# Patient Record
Sex: Male | Born: 1960 | Race: White | Hispanic: No | Marital: Single | Smoking: Never smoker
Health system: Southern US, Community
[De-identification: ages and names within clinical notes are randomized; demographics above are authoritative.]

## PROBLEM LIST (undated history)

## (undated) DIAGNOSIS — I10 Essential (primary) hypertension: Secondary | ICD-10-CM

## (undated) DIAGNOSIS — G4730 Sleep apnea, unspecified: Secondary | ICD-10-CM

## (undated) DIAGNOSIS — Z8614 Personal history of Methicillin resistant Staphylococcus aureus infection: Secondary | ICD-10-CM

## (undated) DIAGNOSIS — F329 Major depressive disorder, single episode, unspecified: Secondary | ICD-10-CM

## (undated) DIAGNOSIS — K219 Gastro-esophageal reflux disease without esophagitis: Secondary | ICD-10-CM

## (undated) DIAGNOSIS — F32A Depression, unspecified: Secondary | ICD-10-CM

## (undated) DIAGNOSIS — M1990 Unspecified osteoarthritis, unspecified site: Secondary | ICD-10-CM

## (undated) DIAGNOSIS — E785 Hyperlipidemia, unspecified: Secondary | ICD-10-CM

## (undated) DIAGNOSIS — F419 Anxiety disorder, unspecified: Secondary | ICD-10-CM

## (undated) DIAGNOSIS — R011 Cardiac murmur, unspecified: Secondary | ICD-10-CM

## (undated) DIAGNOSIS — C801 Malignant (primary) neoplasm, unspecified: Secondary | ICD-10-CM

## (undated) DIAGNOSIS — R519 Headache, unspecified: Secondary | ICD-10-CM

## (undated) HISTORY — DX: Anxiety disorder, unspecified: F41.9

## (undated) HISTORY — DX: Cardiac murmur, unspecified: R01.1

## (undated) HISTORY — DX: Major depressive disorder, single episode, unspecified: F32.9

## (undated) HISTORY — DX: Hyperlipidemia, unspecified: E78.5

## (undated) HISTORY — DX: Unspecified osteoarthritis, unspecified site: M19.90

## (undated) HISTORY — PX: WISDOM TOOTH EXTRACTION: SHX21

## (undated) HISTORY — DX: Sleep apnea, unspecified: G47.30

## (undated) HISTORY — DX: Gastro-esophageal reflux disease without esophagitis: K21.9

## (undated) HISTORY — PX: EYE SURGERY: SHX253

## (undated) HISTORY — DX: Depression, unspecified: F32.A

## (undated) HISTORY — DX: Essential (primary) hypertension: I10

---
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
## 1998-10-05 ENCOUNTER — Encounter: Admission: RE | Admit: 1998-10-05 | Discharge: 1998-10-05 | Payer: Self-pay | Admitting: Family Medicine

## 1998-11-09 ENCOUNTER — Encounter: Admission: RE | Admit: 1998-11-09 | Discharge: 1998-11-09 | Payer: Self-pay | Admitting: Family Medicine

## 1999-12-07 ENCOUNTER — Encounter: Admission: RE | Admit: 1999-12-07 | Discharge: 1999-12-07 | Payer: Self-pay | Admitting: Family Medicine

## 1999-12-28 ENCOUNTER — Encounter: Admission: RE | Admit: 1999-12-28 | Discharge: 1999-12-28 | Payer: Self-pay | Admitting: Family Medicine

## 2001-02-11 ENCOUNTER — Ambulatory Visit (HOSPITAL_COMMUNITY): Admission: RE | Admit: 2001-02-11 | Discharge: 2001-02-11 | Payer: Self-pay | Admitting: Orthopedic Surgery

## 2001-02-11 ENCOUNTER — Encounter: Payer: Self-pay | Admitting: Orthopedic Surgery

## 2001-11-05 ENCOUNTER — Encounter: Admission: RE | Admit: 2001-11-05 | Discharge: 2001-11-05 | Payer: Self-pay | Admitting: Family Medicine

## 2002-04-08 ENCOUNTER — Encounter: Admission: RE | Admit: 2002-04-08 | Discharge: 2002-04-08 | Payer: Self-pay | Admitting: Family Medicine

## 2002-12-15 ENCOUNTER — Encounter: Admission: RE | Admit: 2002-12-15 | Discharge: 2002-12-15 | Payer: Self-pay | Admitting: Family Medicine

## 2003-01-26 ENCOUNTER — Encounter: Admission: RE | Admit: 2003-01-26 | Discharge: 2003-01-26 | Payer: Self-pay | Admitting: Sports Medicine

## 2003-06-29 ENCOUNTER — Encounter: Admission: RE | Admit: 2003-06-29 | Discharge: 2003-06-29 | Payer: Self-pay | Admitting: Sports Medicine

## 2003-07-01 ENCOUNTER — Encounter: Admission: RE | Admit: 2003-07-01 | Discharge: 2003-07-01 | Payer: Self-pay | Admitting: Family Medicine

## 2003-09-14 ENCOUNTER — Encounter: Admission: RE | Admit: 2003-09-14 | Discharge: 2003-09-14 | Payer: Self-pay | Admitting: Family Medicine

## 2003-09-22 ENCOUNTER — Encounter: Admission: RE | Admit: 2003-09-22 | Discharge: 2003-09-22 | Payer: Self-pay | Admitting: Sports Medicine

## 2003-09-22 ENCOUNTER — Encounter: Admission: RE | Admit: 2003-09-22 | Discharge: 2003-09-22 | Payer: Self-pay | Admitting: Family Medicine

## 2005-01-25 ENCOUNTER — Ambulatory Visit: Payer: Self-pay | Admitting: Family Medicine

## 2006-08-17 ENCOUNTER — Ambulatory Visit: Payer: Self-pay | Admitting: Family Medicine

## 2006-11-12 ENCOUNTER — Ambulatory Visit: Payer: Self-pay | Admitting: Sports Medicine

## 2006-11-28 ENCOUNTER — Encounter: Admission: RE | Admit: 2006-11-28 | Discharge: 2006-11-28 | Payer: Self-pay | Admitting: General Surgery

## 2007-01-24 DIAGNOSIS — F528 Other sexual dysfunction not due to a substance or known physiological condition: Secondary | ICD-10-CM

## 2007-01-24 DIAGNOSIS — G4700 Insomnia, unspecified: Secondary | ICD-10-CM

## 2007-03-19 ENCOUNTER — Ambulatory Visit (HOSPITAL_BASED_OUTPATIENT_CLINIC_OR_DEPARTMENT_OTHER): Admission: RE | Admit: 2007-03-19 | Discharge: 2007-03-19 | Payer: Self-pay | Admitting: General Surgery

## 2007-03-19 ENCOUNTER — Encounter (INDEPENDENT_AMBULATORY_CARE_PROVIDER_SITE_OTHER): Payer: Self-pay | Admitting: Specialist

## 2007-05-07 ENCOUNTER — Telehealth (INDEPENDENT_AMBULATORY_CARE_PROVIDER_SITE_OTHER): Payer: Self-pay | Admitting: Family Medicine

## 2007-05-09 ENCOUNTER — Telehealth (INDEPENDENT_AMBULATORY_CARE_PROVIDER_SITE_OTHER): Payer: Self-pay | Admitting: *Deleted

## 2007-06-04 ENCOUNTER — Telehealth (INDEPENDENT_AMBULATORY_CARE_PROVIDER_SITE_OTHER): Payer: Self-pay | Admitting: Family Medicine

## 2007-06-26 ENCOUNTER — Encounter (INDEPENDENT_AMBULATORY_CARE_PROVIDER_SITE_OTHER): Payer: Self-pay | Admitting: Family Medicine

## 2008-01-17 ENCOUNTER — Telehealth (INDEPENDENT_AMBULATORY_CARE_PROVIDER_SITE_OTHER): Payer: Self-pay | Admitting: *Deleted

## 2008-02-20 ENCOUNTER — Encounter: Payer: Self-pay | Admitting: *Deleted

## 2008-03-27 ENCOUNTER — Ambulatory Visit: Payer: Self-pay | Admitting: Family Medicine

## 2008-03-27 ENCOUNTER — Encounter (INDEPENDENT_AMBULATORY_CARE_PROVIDER_SITE_OTHER): Payer: Self-pay | Admitting: Family Medicine

## 2008-03-27 ENCOUNTER — Telehealth (INDEPENDENT_AMBULATORY_CARE_PROVIDER_SITE_OTHER): Payer: Self-pay | Admitting: *Deleted

## 2008-03-27 LAB — CONVERTED CEMR LAB
BUN: 17 mg/dL (ref 6–23)
CO2: 22 meq/L (ref 19–32)
Calcium: 9.2 mg/dL (ref 8.4–10.5)
Chloride: 108 meq/L (ref 96–112)
Cholesterol: 217 mg/dL — ABNORMAL HIGH (ref 0–200)
Creatinine, Ser: 0.98 mg/dL (ref 0.40–1.50)
Glucose, Bld: 97 mg/dL (ref 70–99)
HCT: 44.5 % (ref 39.0–52.0)
HDL: 44 mg/dL (ref >39)
Hemoglobin: 15.1 g/dL (ref 13.0–17.0)
LDL Cholesterol: 147 mg/dL — ABNORMAL HIGH (ref 0–99)
MCHC: 33.9 g/dL (ref 30.0–36.0)
MCV: 92.9 fL (ref 78.0–100.0)
PSA: 0.85 ng/mL (ref 0.10–4.00)
Platelets: 200 10*3/uL (ref 150–400)
Potassium: 4.6 meq/L (ref 3.5–5.3)
RBC: 4.79 M/uL (ref 4.22–5.81)
RDW: 13.4 % (ref 11.5–15.5)
Sodium: 140 meq/L (ref 135–145)
Total CHOL/HDL Ratio: 4.9
Triglycerides: 129 mg/dL (ref <150)
VLDL: 26 mg/dL (ref 0–40)
WBC: 4.6 10*3/uL (ref 4.0–10.5)

## 2008-03-28 ENCOUNTER — Encounter (INDEPENDENT_AMBULATORY_CARE_PROVIDER_SITE_OTHER): Payer: Self-pay | Admitting: Family Medicine

## 2008-07-22 ENCOUNTER — Ambulatory Visit: Payer: Self-pay | Admitting: Family Medicine

## 2008-07-22 ENCOUNTER — Telehealth: Payer: Self-pay | Admitting: *Deleted

## 2008-07-27 ENCOUNTER — Telehealth: Payer: Self-pay | Admitting: *Deleted

## 2008-07-29 ENCOUNTER — Encounter: Admission: RE | Admit: 2008-07-29 | Discharge: 2008-07-29 | Payer: Self-pay | Admitting: Family Medicine

## 2008-07-29 ENCOUNTER — Telehealth: Payer: Self-pay | Admitting: Psychology

## 2008-07-29 ENCOUNTER — Ambulatory Visit: Payer: Self-pay | Admitting: Family Medicine

## 2008-07-29 DIAGNOSIS — F911 Conduct disorder, childhood-onset type: Secondary | ICD-10-CM | POA: Insufficient documentation

## 2008-12-10 ENCOUNTER — Telehealth: Payer: Self-pay | Admitting: *Deleted

## 2009-03-09 ENCOUNTER — Telehealth (INDEPENDENT_AMBULATORY_CARE_PROVIDER_SITE_OTHER): Payer: Self-pay | Admitting: *Deleted

## 2009-06-17 ENCOUNTER — Telehealth: Payer: Self-pay | Admitting: Family Medicine

## 2009-08-16 ENCOUNTER — Encounter: Payer: Self-pay | Admitting: Family Medicine

## 2009-08-16 ENCOUNTER — Ambulatory Visit: Payer: Self-pay | Admitting: Family Medicine

## 2009-08-18 LAB — CONVERTED CEMR LAB
ALT: 23 units/L (ref 0–53)
AST: 16 units/L (ref 0–37)
Albumin: 4.5 g/dL (ref 3.5–5.2)
Alkaline Phosphatase: 49 units/L (ref 39–117)
BUN: 19 mg/dL (ref 6–23)
CO2: 24 meq/L (ref 19–32)
Calcium: 9 mg/dL (ref 8.4–10.5)
Chloride: 106 meq/L (ref 96–112)
Cholesterol: 198 mg/dL (ref 0–200)
Creatinine, Ser: 1.14 mg/dL (ref 0.40–1.50)
Glucose, Bld: 89 mg/dL (ref 70–99)
HCT: 45 % (ref 39.0–52.0)
HDL: 47 mg/dL (ref >39)
Hemoglobin: 15.1 g/dL (ref 13.0–17.0)
Hep B S AB Quant (Post): 9.9 milliintl units/mL
LDL Cholesterol: 122 mg/dL — ABNORMAL HIGH (ref 0–99)
MCHC: 33.6 g/dL (ref 30.0–36.0)
MCV: 92 fL (ref 78.0–100.0)
Platelets: 217 10*3/uL (ref 150–400)
Potassium: 4.5 meq/L (ref 3.5–5.3)
RBC: 4.89 M/uL (ref 4.22–5.81)
RDW: 13 % (ref 11.5–15.5)
Sodium: 140 meq/L (ref 135–145)
Total Bilirubin: 0.6 mg/dL (ref 0.3–1.2)
Total CHOL/HDL Ratio: 4.2
Total Protein: 7.1 g/dL (ref 6.0–8.3)
Triglycerides: 144 mg/dL (ref <150)
VLDL: 29 mg/dL (ref 0–40)
WBC: 5 10*3/uL (ref 4.0–10.5)

## 2009-08-30 ENCOUNTER — Telehealth: Payer: Self-pay | Admitting: Family Medicine

## 2009-08-31 ENCOUNTER — Encounter: Payer: Self-pay | Admitting: Family Medicine

## 2009-10-04 ENCOUNTER — Telehealth: Payer: Self-pay | Admitting: Family Medicine

## 2009-11-02 ENCOUNTER — Telehealth: Payer: Self-pay | Admitting: Family Medicine

## 2009-11-27 HISTORY — PX: OTHER SURGICAL HISTORY: SHX169

## 2009-12-29 ENCOUNTER — Telehealth: Payer: Self-pay | Admitting: Psychology

## 2010-01-06 ENCOUNTER — Ambulatory Visit: Payer: Self-pay | Admitting: Psychology

## 2010-01-06 DIAGNOSIS — F39 Unspecified mood [affective] disorder: Secondary | ICD-10-CM | POA: Insufficient documentation

## 2010-01-11 ENCOUNTER — Telehealth: Payer: Self-pay | Admitting: Psychology

## 2010-01-28 ENCOUNTER — Telehealth: Payer: Self-pay | Admitting: Family Medicine

## 2010-01-28 ENCOUNTER — Ambulatory Visit: Payer: Self-pay | Admitting: Family Medicine

## 2010-01-31 ENCOUNTER — Telehealth: Payer: Self-pay | Admitting: Psychology

## 2010-01-31 ENCOUNTER — Telehealth: Payer: Self-pay | Admitting: *Deleted

## 2010-02-07 ENCOUNTER — Telehealth (INDEPENDENT_AMBULATORY_CARE_PROVIDER_SITE_OTHER): Payer: Self-pay | Admitting: Family Medicine

## 2010-02-08 ENCOUNTER — Telehealth: Payer: Self-pay | Admitting: Family Medicine

## 2010-02-15 ENCOUNTER — Telehealth: Payer: Self-pay | Admitting: Psychology

## 2010-03-10 ENCOUNTER — Telehealth: Payer: Self-pay | Admitting: Family Medicine

## 2010-03-10 ENCOUNTER — Telehealth: Payer: Self-pay | Admitting: Psychology

## 2010-03-10 ENCOUNTER — Ambulatory Visit: Payer: Self-pay | Admitting: Family Medicine

## 2010-03-10 DIAGNOSIS — J069 Acute upper respiratory infection, unspecified: Secondary | ICD-10-CM | POA: Insufficient documentation

## 2010-03-10 DIAGNOSIS — M25579 Pain in unspecified ankle and joints of unspecified foot: Secondary | ICD-10-CM | POA: Insufficient documentation

## 2010-04-27 ENCOUNTER — Ambulatory Visit: Payer: Self-pay | Admitting: Psychology

## 2010-05-24 ENCOUNTER — Telehealth: Payer: Self-pay | Admitting: Psychology

## 2010-06-06 ENCOUNTER — Telehealth: Payer: Self-pay | Admitting: Family Medicine

## 2010-06-24 ENCOUNTER — Encounter: Payer: Self-pay | Admitting: Family Medicine

## 2010-07-06 ENCOUNTER — Telehealth: Payer: Self-pay | Admitting: Psychology

## 2010-07-07 ENCOUNTER — Telehealth: Payer: Self-pay | Admitting: Family Medicine

## 2010-08-02 ENCOUNTER — Telehealth: Payer: Self-pay | Admitting: Family Medicine

## 2010-08-08 ENCOUNTER — Ambulatory Visit: Payer: Self-pay | Admitting: Family Medicine

## 2010-08-12 ENCOUNTER — Telehealth: Payer: Self-pay | Admitting: Family Medicine

## 2010-08-15 ENCOUNTER — Ambulatory Visit: Payer: Self-pay | Admitting: Family Medicine

## 2010-08-15 ENCOUNTER — Encounter: Admission: RE | Admit: 2010-08-15 | Discharge: 2010-08-15 | Payer: Self-pay | Admitting: Family Medicine

## 2010-08-15 DIAGNOSIS — M722 Plantar fascial fibromatosis: Secondary | ICD-10-CM

## 2010-08-16 ENCOUNTER — Telehealth (INDEPENDENT_AMBULATORY_CARE_PROVIDER_SITE_OTHER): Payer: Self-pay | Admitting: *Deleted

## 2010-08-16 ENCOUNTER — Encounter: Payer: Self-pay | Admitting: Family Medicine

## 2010-08-19 ENCOUNTER — Encounter: Admission: RE | Admit: 2010-08-19 | Discharge: 2010-08-19 | Payer: Self-pay | Admitting: Family Medicine

## 2010-08-22 ENCOUNTER — Encounter (INDEPENDENT_AMBULATORY_CARE_PROVIDER_SITE_OTHER): Payer: Self-pay | Admitting: *Deleted

## 2010-08-22 ENCOUNTER — Encounter: Payer: Self-pay | Admitting: Family Medicine

## 2010-09-01 ENCOUNTER — Encounter: Payer: Self-pay | Admitting: Family Medicine

## 2010-11-01 ENCOUNTER — Ambulatory Visit: Payer: Self-pay | Admitting: Family Medicine

## 2010-11-01 ENCOUNTER — Encounter: Payer: Self-pay | Admitting: Family Medicine

## 2010-11-01 DIAGNOSIS — F411 Generalized anxiety disorder: Secondary | ICD-10-CM

## 2010-11-01 DIAGNOSIS — E785 Hyperlipidemia, unspecified: Secondary | ICD-10-CM

## 2010-11-01 LAB — CONVERTED CEMR LAB
ALT: 31 units/L (ref 0–53)
AST: 20 units/L (ref 0–37)
Albumin: 4.6 g/dL (ref 3.5–5.2)
Alkaline Phosphatase: 53 units/L (ref 39–117)
BUN: 14 mg/dL (ref 6–23)
CO2: 26 meq/L (ref 19–32)
Calcium: 9.8 mg/dL (ref 8.4–10.5)
Chloride: 105 meq/L (ref 96–112)
Cholesterol, target level: 200 mg/dL
Cholesterol: 245 mg/dL — ABNORMAL HIGH (ref 0–200)
Creatinine, Ser: 1.06 mg/dL (ref 0.40–1.50)
Glucose, Bld: 98 mg/dL (ref 70–99)
HDL goal, serum: 40 mg/dL
HDL: 46 mg/dL (ref >39)
LDL Cholesterol: 153 mg/dL — ABNORMAL HIGH (ref 0–99)
LDL Goal: 160 mg/dL
Potassium: 4.2 meq/L (ref 3.5–5.3)
Sodium: 139 meq/L (ref 135–145)
Total Bilirubin: 0.6 mg/dL (ref 0.3–1.2)
Total CHOL/HDL Ratio: 5.3
Total Protein: 7.3 g/dL (ref 6.0–8.3)
Triglycerides: 228 mg/dL — ABNORMAL HIGH (ref <150)
VLDL: 46 mg/dL — ABNORMAL HIGH (ref 0–40)

## 2010-11-04 ENCOUNTER — Telehealth: Payer: Self-pay | Admitting: Family Medicine

## 2010-11-07 ENCOUNTER — Telehealth: Payer: Self-pay | Admitting: Family Medicine

## 2010-11-10 ENCOUNTER — Encounter: Payer: Self-pay | Admitting: Family Medicine

## 2010-11-10 ENCOUNTER — Ambulatory Visit: Payer: Self-pay

## 2010-11-10 ENCOUNTER — Encounter: Payer: Self-pay | Admitting: Sports Medicine

## 2010-12-02 ENCOUNTER — Ambulatory Visit
Admission: RE | Admit: 2010-12-02 | Discharge: 2010-12-02 | Payer: Self-pay | Source: Home / Self Care | Attending: Family Medicine | Admitting: Family Medicine

## 2010-12-05 ENCOUNTER — Telehealth: Payer: Self-pay | Admitting: Family Medicine

## 2010-12-19 ENCOUNTER — Telehealth: Payer: Self-pay | Admitting: *Deleted

## 2010-12-27 NOTE — Progress Notes (Signed)
Summary: RX   Phone Note Call from Patient Call back at [phone]   Reason for Call: Talk to Doctor Summary of Call: pt was checking status of RX for ambien, says it was suppose to be up front & ready for pick up Initial call taken by: Knox Royalty,  February 08, 2010 9:32 AM  Follow-up for Phone Call        to PCP, Rx not in box, not entered in log for pt to sign out when picked up, do not know what happen to Rx after you put it in box.  Pt has apt with you on Mar 21 Follow-up by: Gladstone Pih,  February 08, 2010 11:52 AM  Additional Follow-up for Phone Call Additional follow up Details #1::        Will re-write the prescription and place it in the front office to be picked up. Additional Follow-up by: Marisue Ivan  MD,  February 08, 2010 1:13 PM     Appended Document: RX LVM that rx was up front ready to be picked up.

## 2010-12-27 NOTE — Letter (Signed)
Summary: AIM  AIM   Imported By: Marily Memos 08/16/2010 09:10:29  _____________________________________________________________________  External Attachment:    Type:   Image     Comment:   External Document

## 2010-12-27 NOTE — Assessment & Plan Note (Signed)
Summary: [facility] follow-up   Primary Care Provider:  Marisue Ivan  MD   History of Present Illness: Patient here to explore mood issues and is interested in medication options to help with his most bothersome symptoms of insomnia, anxiety and irritability.  First noticed problems with anxiety 20 years ago.  He reported being very self-confident and driven in a high demand occupation.  He reports he was never comfortable with the interpersonal side of things but would push through it.  This became increasingly difficult to manage and he lost his cheerful, gregarious side.  He reports an underlying sadness that would surface at times that he would suppress.    Saw a psychiatrist in 1996.  Offered "nothing of value."  No medication offered.  Gathered family history again.  See initial behavioral medicine note dated 01/06/10 for details.  Significant family history of mental illness (possible schizophrenia) in father.  Mother and two sisters have significant issues with anxiety and possibly depression.  Mother recently died (since the 2023-01-17 visit).  Jason Williams says he was very close to his mother.    Substance use:  Drinks red and white wine - about three bottles in a week.  Gets "buzzed" about 5 times a month.  Does not drink daily.  No marijuana use.  He reports having used Valium about 15 years ago and it helped with his anxiety.    Last two weeks - sadness everyday but not all day.  Won't allow himself to feel hopeless.  He reports periods of time, lasting several days where he has increased energy.  Volunteers that he does not have Bipolar Disorder in response to this line of questioning.  He does report a day or so of feeling like his old self about once or twice a month.  Reports increased irritability with an over-active mind during these times.    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  Affect is sad and irritable.  Thoughts are clear and goal directed.  Remains defensive but softens over the course of the visit.  Voices how difficult it is to come and ask for help.  Denied suicidal / homicidal ideatoin.  Symptoms do not meet strict criteria for a bipolar spectrum disorder.  With family history, treatment failures on antidepressants, sleep issues and symptom pattern (depressed mood with brief periods of "normal mood" characterized by high energy, racing thoughts, irritability and some more goal directed activity) recommended two medication options.  Discussed pros and cons.  Elected to start Lamotrigine to address the depression and hopefully capture some of the anxiety as well.  Discussed AE including unexpected rash and headache.  Discussed use of beta-blocker in specific situations - [place].  Discussed impact on exercise tolerance and taking pulse.  Patient agreed with the treatment planning.  Questions and concerns were elicited and addressed.  Benzodiazepines and anti-depressants are not good choices for pharmacologic management.  [phone])  Complete Medication List: 1)  Viagra 100 Mg Tabs (Sildenafil citrate) .Marland Kitchen.. 1 tab by mouth 30 mins prior to intercourse.  max 1 tab daily 2)  Ambien 10 Mg Tabs (Zolpidem tartrate) .Marland Kitchen.. 1 tab by mouth at bedtime as needed insomnia 3)  Doxycycline Hyclate 100 Mg Tabs (Doxycycline hyclate) .Marland Kitchen.. 1 tab by mouth two times a day x 7 days 4)  Lamotrigine 25 Mg Tabs (Lamotrigine) .... One daily for two  weeks and then two daily.  per dr. Kathrynn Running in [facility] clinic. 5)  Nadolol 20 Mg Tabs (Nadolol) .... Take one daily.  per dr. Kathrynn Running in mood disorder clinic.  Patient Instructions: 1)  Please schedule a follow-up for:  June 29th at 9:00.  Call if you are unable to make this appointment. 2)  Dr. Kathrynn Running started you on a medication called Lamotrigine or Lamictal.  You can go to [email or web address] to  read more about it.  If you have an unexpected rash, please call [phone] or get seen at the [facility].   3)  We are looking to balance safety, tolerability and effectiveness.  Please call if you have questions about this. 4)  Dr. Kathrynn Running also prescribed a beta-blocker for social anxiety.  This is to shut off that feedback loop of sweating.  Take it two to three hours prior to a situation where social anxiety may get in the way of your functioning.  Take one to determine tolerability and efficacy.  If tolerable but not efficacious - take two. Prescriptions: NADOLOL 20 MG TABS (NADOLOL) Take one daily.  Per Dr. Kathrynn Running in [facility].  #60 x 0   Entered and Authorized by:   Spero Geralds PsyD   Signed by:   Spero Geralds PsyD on 04/27/2010   Method used:   Handwritten   RxID:   [id number] LAMOTRIGINE 25 MG TABS (LAMOTRIGINE) One daily for two weeks and then two daily.  Per Dr. Kathrynn Running in [facility].  #60 x 0   Entered and Authorized by:   Spero Geralds PsyD   Signed by:   Spero Geralds PsyD on 04/27/2010   Method used:   Handwritten   RxID:   [id number]

## 2010-12-27 NOTE — Assessment & Plan Note (Signed)
Summary: Anxiety, Insomnia, HLD, impotence   Vital Signs:  Patient profile:   50 year old male Height:      69.2 inches Weight:      199.1 pounds BMI:     29.34 Temp:     97.7 degrees F oral Pulse rate:   77 / minute BP sitting:   135 / 94  (left arm) Cuff size:   regular  Vitals Entered By: Garen Grams LPN (November 01, 2010 9:15 AM) CC: Anxiety, insomonia, HLD, Lipid Management Is Patient Diabetic? No Pain Assessment Patient in pain? no        Primary Care Provider:  Jamie Brookes MD  CC:  Anxiety, insomonia, HLD, and Lipid Management.  History of Present Illness: Health Maintenence exam: Pt was late for his appointment so we focused on his major concerns including his anxiety, insomnia and labs.   Anxiety: Pt has been having problems with anxiety for a while. He has anxiety attacks at meetings/functions sometimes and has tried some things for it in the past. He has tried Celexa, Seroquel and nadolol for the anxiety but it has not helped. He has seen Dr. Pascal Lux and Dr. Kathrynn Running in clinic and thought they were nice but didn't feel he got much help out of the meeting.   Insomnia: Pt continues to use Ambien for his insomnia. It almost always works, he has not needed increased doses, he does not have any side effects from it so he would like to continue this med.   HLD: Pt has HLD and has not been on a statin. His lipids have not been checked in almost a year. He is fasting today and wants to get his bloodwork done.   Impotence: Pt has not had to use the Viagra much but does need a refill as it has been > than  a year since it was refilled.   Spent > 25 min face to face time with this patient.   Lipid Management History:      Positive NCEP/ATP III risk factors include male age 72 years old or older.  Negative NCEP/ATP III risk factors include non-diabetic and non-tobacco-user status.     Habits & Providers  Alcohol-Tobacco-Diet     Tobacco Status: never  Current  Medications (verified): 1)  Viagra 100 Mg  Tabs (Sildenafil Citrate) .Marland Kitchen.. 1 Tab By Mouth 30 Mins Prior To Intercourse.  Max 1 Tab Daily 2)  Ambien 10 Mg Tabs (Zolpidem Tartrate) .... Take 1 Tablet Every Night For Sleep 3)  Sertraline Hcl 25 Mg Tabs (Sertraline Hcl) .... Take 1 Pill Daily (Morning or Evening) 4)  Lorazepam 0.5 Mg Tabs (Lorazepam) .... Take 1 Pill At Times of Great Anxiety  Allergies (verified): No Known Drug Allergies  Social History: Lives in [place] with significant other.  Has one child. 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  Occasional EtOH No drug use. No supplemental vitamins. Lifts weights for exercise  Review of Systems        vitals reviewed and pertinent negatives and positives seen in HPI   Physical Exam  General:  Well-developed,well-nourished,in no acute distress; alert,appropriate and cooperative throughout examination Head:  Normocephalic and atraumatic without obvious abnormalities. No apparent alopecia or balding. Ears:  no external deformities.   Nose:  no external deformity.   Mouth:  good dentition.   Lungs:  Normal respiratory effort, chest expands symmetrically. Lungs are clear to auscultation, no crackles or wheezes. Heart:  Normal rate and regular rhythm.  S1 and S2 normal without gallop, murmur, click, rub or other extra sounds. Skin:  an SK on his back and a red spot on his back (? AK).  Psych:  Cognition and judgment appear intact. Alert and cooperative with normal attention span and concentration. No apparent delusions, illusions, hallucinations   Problems:  Medical Problems Added: 1)  Dx of Hyperlipidemia, Mild  (ICD-272.4) 2)  Dx of Anxiety State, Unspecified  (ICD-300.00)  Impression & Recommendations:  Problem # 1:  ANXIETY STATE, UNSPECIFIED (ICD-300.00) Assessment New Pt has anxiety at certain events and has not been treated adequately in the past. Plan to start Zoloft on a low dose and go up if  needed, then I also gave the patient #30 Ativan to keep for those extremely tense events when he thinks he may have a panic attack. Explained that this was not a daily med. RTC in 4 weeks to evaluate how he is doing on these meds. May need to increase the dose of the Zoloft if he is not feeling any better and has no SEs.   His updated medication list for this problem includes:    Sertraline Hcl 25 Mg Tabs (Sertraline hcl) .Marland Kitchen... Take 1 pill daily (morning or evening)    Lorazepam 0.5 Mg Tabs (Lorazepam) .Marland Kitchen... Take 1 pill at times of great anxiety  Orders: [facility]- Est  Level 4 ([address]109)  Problem # 2:  INSOMNIA NOS (ICD-780.52) Assessment: Unchanged Pt is doing well on the Ambien. Has tried Trazodone, Lunesta and has had Seroquel in the past and non of them helped. Plan to continue him on Ambien for now.   His updated medication list for this problem includes:    Ambien 10 Mg Tabs (Zolpidem tartrate) .Marland Kitchen... Take 1 tablet every night for sleep  Orders: [facility]- Est  Level 4 (99214)  Problem # 3:  HYPERLIPIDEMIA, MILD (ICD-272.4) Assessment: Unchanged Pt has elevated LDL of 122 about a year ago. Plan to retest. Could consider a statin if his LDL is still elevated but don't really have to start it until his LDL is > 160.   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   His updated medication list for this problem includes:    Viagra 100 Mg Tabs (Sildenafil citrate) .Marland Kitchen... 1 tab by mouth 30 mins prior to intercourse.  max 1 tab daily  Orders: [facility]- Est  Level 4 (09811)  Complete Medication List: 1)  Viagra 100 Mg Tabs (Sildenafil citrate) .Marland Kitchen.. 1 tab by mouth 30 mins prior to intercourse.  max 1 tab daily 2)  Ambien 10 Mg Tabs (Zolpidem tartrate) .... Take 1 tablet every night for sleep 3)  Sertraline Hcl 25 Mg Tabs (Sertraline hcl) .... Take 1 pill daily (morning or  evening) 4)  Lorazepam 0.5 Mg Tabs (Lorazepam) .... Take 1 pill at times of great anxiety  Other Orders: Comp Met-[facility] [phone]) Lipid-[facility] (13086-57846)  Lipid Assessment/Plan:      Based on NCEP/ATP III, the patient's risk factor category is "0-1 risk factors".  The patient's lipid goals are as follows: Total cholesterol goal is 200; LDL cholesterol goal is 160; HDL cholesterol goal is 40; Triglyceride goal is 150.  His LDL cholesterol goal has been met.    Patient Instructions: 1)  Come back in 4 weeks to go over your anxiety meds and to take a look at your skin.  Prescriptions: LORAZEPAM 0.5 MG TABS (LORAZEPAM) take 1 pill at times of great anxiety  #30 x 0   Entered and Authorized by:   Jamie Brookes MD   Signed by:   Jamie Brookes MD on 11/01/2010   Method used:   Handwritten   RxID:   [id number] SERTRALINE HCL 25 MG TABS (SERTRALINE HCL) take 1 pill daily (morning or evening)  #31 x 2   Entered and Authorized by:   Jamie Brookes MD   Signed by:   Jamie Brookes MD on 11/01/2010   Method used:   Electronically to        [facility]  [address] [phone]* (retail)       [address]       [place], [place]  [address]       Ph: [phone] or [phone]       Fax: [phone]   RxID:   [id number] AMBIEN 10 MG TABS (ZOLPIDEM TARTRATE) take 1 tablet every night for sleep  #31 x 7   Entered and Authorized by:   Jamie Brookes MD   Signed by:   Jamie Brookes MD on 11/01/2010   Method used:   Printed then faxed to ...       [facility] (mail-order)             , [place]         Ph: [phone]       Fax: [phone]   RxID:   [id number] VIAGRA 100 MG  TABS (SILDENAFIL CITRATE) 1 tab by mouth 30 mins prior to intercourse.  Max 1 tab daily  #30 x 1   Entered and Authorized by:   Jamie Brookes MD   Signed by:   Jamie Brookes MD on 11/01/2010   Method used:   Faxed to ...       [facility] (mail-order)             , [place]         Ph: [phone]        Fax: [phone]   RxID:   [id number]    Orders Added: 1)  Comp Met-[facility] [[id number]] 2)  Lipid-[facility] [80061-22930] 3)  [facility]- Est  Level 4 [58099]

## 2010-12-27 NOTE — Progress Notes (Signed)
Summary: Reschedule appts   Phone Note Call from Patient   Caller: Patient Call For: Spero Geralds, Psy.D. Summary of Call: Patient called to cancel his beh-med appt for 01/20/10 secondary to being in training.  He said he is not in training on 3/2 which was my original understanding.  So [facility] and I have scheduled him there.  I have canceled the 3/4 appt with Dr. Burnadette Pop secondary to this change.  [facility] will initiate pharmacotherapy and follow him up 3/23 as initially scheduled.  Patient voiced an understanding of these changes.  He said he did not need to meet with Dr. Burnadette Pop for reasons other than starting a psychopharm agent so I thought it prudent to cancel that appt.   Initial call taken by: Spero Geralds PsyD,  January 11, 2010 10:58 AM

## 2010-12-27 NOTE — Progress Notes (Signed)
Summary: phn msg   Phone Note Call from Patient Call back at [facility] Phone [phone]   Caller: Patient Summary of Call: Pt requesting Dr. Burnadette Pop call him.  He has no recollection of his appointment today being cancelled. Initial call taken by: Clydell Hakim,  January 28, 2010 9:57 AM  Follow-up for Phone Call        Patient was transferred to me.  Not sure where the communication problems lie as this is the second time that we have had difficulty with his schedule.  He missed his [facility] appointment for March 2nd and does not remember the conversation we had about it (see phone note).  He is interested in starting a medication for sleep and mood prior to his March 23rd [facility] appt.  I said he could call Dr. Burnadette Pop to schedule an appt and hopefully he can get seen and then we can follow him up on the 23rd at 9:00.  He said he would write down his appointment for March 23rd at 9:00. Follow-up by: Spero Geralds PsyD,  January 28, 2010 10:03 AM  Additional Follow-up for Phone Call Additional follow up Details #1::        Pt in the office now. Additional Follow-up by: Marisue Ivan  MD,  January 28, 2010 11:13 AM

## 2010-12-27 NOTE — Progress Notes (Signed)
Summary: Discuss Meds   Phone Note Call from Patient Call back at [phone]   Reason for Call: Talk to Doctor Summary of Call: would like to discuss side effects of  the medication that was prescribed, pt cannot sleep even using ambien Initial call taken by: Knox Royalty,  February 07, 2010 10:34 AM  Follow-up for Phone Call        to pcp Follow-up by: Gladstone Pih,  February 07, 2010 11:51 AM  Additional Follow-up for Phone Call Additional follow up Details #1::        pt needs to set up an office visit to discuss treatment and possible side effects- better handled in the clinic and would be best treated by Dr. Pascal Lux and Dr. Kathrynn Running Additional Follow-up by: Marisue Ivan  MD,  February 07, 2010 12:30 PM    Additional Follow-up for Phone Call Additional follow up Details #2::    Notified pt, voiced understanding Follow-up by: Gladstone Pih,  February 07, 2010 1:31 PM

## 2010-12-27 NOTE — Assessment & Plan Note (Signed)
Summary: Started Seroquel for mood disorder NOS   Vital Signs:  Patient profile:   50 year old male Height:      69.2 inches Weight:      198.4 pounds BMI:     29.23 Temp:     97.7 degrees F oral Pulse rate:   78 / minute BP sitting:   140 / 86  (left arm) Cuff size:   large  Vitals Entered By: Gladstone Pih (January 28, 2010 11:06 AM) CC: f/u on mood instability Is Patient Diabetic? No Pain Assessment Patient in pain? no        Primary Care Provider:  Marisue Ivan  MD  CC:  f/u on mood instability.  History of Present Illness: 50yo M here to f/u on mood instability.  Mood instability: No absolute diagnosis at this time.  States that he continues to have mood swings but denies manic episodes.  He is open to the idea of pharmacological treatment.  He missed his appt with Behavioral Health.  No thoughts of hurting himself or others at this time.  Habits & Providers  Alcohol-Tobacco-Diet     Tobacco Status: never  Allergies: No Known Drug Allergies  Past History:  Past Medical History: Mood Disorder NOS Impotence Insomnia  Review of Systems       See HPI otherwise neg  Physical Exam  General:  VS Reviewed. Well appearing, NAD.  Psych:  No signs of mania. 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  Titrate up by 50mg  to a goal dose of 300mg  at bedtime. Will f/u in 2 weeks to reassess. Will f/u closely to assess for DM and HLD while on the medication.  Orders: [facility]- Est Level  3 (14782)  Complete Medication List: 1)  Viagra 100 Mg Tabs (Sildenafil citrate) .Marland Kitchen.. 1 tab by mouth 30 mins prior to intercourse.  max 1 tab daily 2)  Seroquel 50 Mg Tabs (Quetiapine fumarate) .Marland Kitchen.. 1 tab by mouth at bedtime  inc by 1 tablet every night until you get to 300mg  and  stay at that dose  Patient Instructions: 1)  Please schedule a follow-up appointment in 2 weeks.  2)  Follow the titration schedule of the seroquel.  Call me if you have any side effects.   Prescriptions: SEROQUEL 50 MG TABS (QUETIAPINE FUMARATE) 1 tab by mouth at bedtime  inc by 1 tablet every night until you get to 300mg  and stay at that dose  #80 x 0   Entered and Authorized by:   Marisue Ivan  MD   Signed by:   Marisue Ivan  MD on 01/28/2010   Method used:   Print then Give to Patient   RxID:   [id number]

## 2010-12-27 NOTE — Assessment & Plan Note (Signed)
Summary: F/U [facility]   Primary Care Kimorah Ridolfi:  Jason Ivan  MD   History of Present Illness: 1. Bilateral foot pain:  Seen in clinic last week and diagnosed with plantar fasciitis.  The pain is worse in the right foot then the left foot.  Was given some stretching exercises which he does everyday in the morning.   He was also place in a walking boot because of ankle pain.  The plantar fasciitis overall is not any better.  It doesn't bother him that much on the left but the right is still very painful. Not using the temporary custom orthotic with scaphoid pad as it seemed to make it hurt worse.  2. Right ankle pain:  Seen in clinic last week and diagnosed with chronic ATF injury.  Was place in a walking boot because he was not able to ambulate without pain.  He has been using this as directed.  The pain overall is not improved.  Still limits his ability to do certain activities.  He has also been doing the inversion / eversion exercises.  Very frustrated that he is not getting better. Continues to work full time--on his feet much of the day. Boot has heled a little with that pain (daily walking). Still not able to golf.  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 1 Tab By Mouth 30 Mins Prior To Intercourse.  Max 1 Tab Daily 2)  Doxycycline Hyclate 100 Mg Tabs (Doxycycline Hyclate) .Marland Kitchen.. 1 Tab By Mouth Two Times A Day X 7 Days 3)  Lamotrigine 25 Mg Tabs (Lamotrigine) .... One Daily For Two Weeks and Then Two Daily.  Per Dr. Kathrynn Running in [facility]. 4)  Nadolol 20 Mg Tabs (Nadolol) .... Take One Daily.  Per Dr. Kathrynn Running in [facility]. 5)  Ambien 10 Mg Tabs (Zolpidem Tartrate) .... Take 1 Tablet Every Night For Sleep  Allergies: No Known Drug Allergies  Past History:  Past Surgical History: Last updated: 07/29/2008 Vasectomy Appendectomy abdominal hernia repair 2008  Family History: Last updated: 03/27/2008 mother- COPD  father- deceased when pt was 7, psych illness 2 sisters- healthy  Social History: Last updated: 08/16/2009 Lives in [place].  Single.  Has one child. 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  Occasional EtOH No drug use. No supplemental vitamins. Lifts weights for exercise  Past Medical History: Reviewed history from 01/28/2010 and no changes required. Mood Disorder NOS Impotence Insomnia  Review of Systems  The patient denies fever.         No numbness or  parasthesias in foot or leg. No skin changes.Please see HPI for additional ROS.   Physical Exam  General:  alert, well-developed, well-nourished, and well-hydrated.   Msk:  RIGHT ANKLE/FOOT: TTP dorsum of foot near over cuneiform prominence.. Pain reproduced with dorsiflexion, inversion, and eversion.  Full ROM. pain with passive inversion, pain with active eversion. Plantar fascia TTP along entire length.  LEFT ANKLE FOOT: ttp along plantar fascia at origin. Dorsiflexion and plantar flexion strength and ROM intact  NEURO intact soft  touch sensation. SKIN no rashes or lesions or callous on feet PULSES 2+ B =   Impression & Recommendations:  Problem # 1:  PAIN IN JOINT, ANKLE AND FOOT (ICD-719.47) Assessment Unchanged  Not improved.  Will get imaging studies as this is not really clear to me (diagnosis). His pain level is prettysevere--wonder if this is a neuropathic pain (regional pain yndrome). He is very frustrated.  (PMH of mood disorder noted)  Orders: MRI (MRI)  Problem # 2:  PLANTAR  FASCIITIS, BILATERAL (ICD-728.71) Assessment: Unchanged  Not improved.  Continue with stretching exercises.  Orders: MRI (MRI)  Complete Medication List: 1)  Viagra 100 Mg Tabs (Sildenafil citrate) .Marland Kitchen.. 1 tab by mouth 30 mins prior to intercourse.  max 1 tab daily 2)  Doxycycline Hyclate 100 Mg Tabs (Doxycycline hyclate) .Marland Kitchen.. 1 tab by mouth two times a day x 7 days 3)  Lamotrigine 25 Mg Tabs (Lamotrigine) .... One daily for two weeks and then two daily.  per dr. Kathrynn Running in mood disorder clinic. 4)  Nadolol 20 Mg Tabs (Nadolol) .... Take one daily.  per dr. Kathrynn Running in mood disorder clinic. 5)  Ambien 10 Mg Tabs (Zolpidem tartrate) .... Take 1 tablet every night for sleep  Patient Instructions: 1)  MRI SCHD ON FRI SEPT 23RD AT 2:15PM AT [address]

## 2010-12-27 NOTE — Miscellaneous (Signed)
  Clinical Lists Changes  [address] [place], [place] [phone] Appt is 08/24/10 @ 2:45pm.  Pt notified of appoitment info.

## 2010-12-27 NOTE — Progress Notes (Signed)
Summary: Cancel [facility]DC follow-up   Phone Note Outgoing Call   Caller: Patient Call placed by: Spero Geralds PsyD,  May 24, 2010 12:18 PM Call placed to: Patient Summary of Call: Called patient to see if I could change his appt time for 30 minutes later.  He said that he will be unable to make his appt tomorrow.  He said something came up rather urgently and did not elaborate.  He said he would call back next week to reschedule. Initial call taken by: Spero Geralds PsyD,  May 24, 2010 12:19 PM

## 2010-12-27 NOTE — Progress Notes (Signed)
Summary: Follow-up [facility]    Phone Note Call from Patient   Caller: Patient Call For: Spero Geralds, Psy.D. Summary of Call: Patient called to follow-up after his last [facility] visit.  He said he is unwililng to take the Lamotrigine citing reports on-line about safety issues.  He tried the beta blocker for social anxiety situations but thinks that it only makes him tired and does not help that much with anxiety.  He is requesting a prescription for Valium.  He suspects he would need it, on average, twice a week to manage certain work situations.  I told him that I suspected Dr. Kathrynn Running would not want to write a prescription for that without first discussing it including the advantages and disadvantages.  Jason Williams said that everytime he has come to see me, his out of pocket expense has been over $300 (I can't remember the exact number).  I suggested he contact the billing department or insurance company as this does not seem accurate from my end.  He is not willing to schedule a follow-up.  I told him I would run it past Dr. Kathrynn Running and let him know.  His call back number is [phone].   Initial call taken by: Spero Geralds PsyD,  July 07, 2010 11:32 AM     Appended Document: Follow-up [facility]  Discussed with Dr. Kathrynn Running who said that he would not be willing to write a prescription for Valium.  I telephoned Jason Williams to let him know.  Additionally, I looked into the charges and noted in our system a charge for $308.  I suggested to him that perhaps that was a preauthorization issue.  If he would like to pursue, I would be happy to complete paperwork on his behalf.  He thanked me for the call.

## 2010-12-27 NOTE — Progress Notes (Signed)
Summary: phn msg   Phone Note Call from Patient Call back at Home Phone [phone]   Caller: Patient Summary of Call: Pt would like to talk to Dr. Clotilde Dieter about his feet.  They are getting to the point that he can hardly walk on them. Initial call taken by: Clydell Hakim,  August 02, 2010 1:50 PM  Follow-up for Phone Call        Jason Williams has an appt with Dr. Jennette Kettle on Monday for feet pain.  Will still need to talk to Dr. Clotilde Dieter regarding Ambien meds.  Please call him at [phone] Follow-up by: Abundio Miu  Additional Follow-up for Phone Call Additional follow up Details #1::        Called the patient and left a message. I don't know what his question was about [place]. Encouraged pt to leave detailed messages with office staff.  He had 5 refills as of 06/06/10. Will be happy to answer any questions about them on Monday.  Additional Follow-up by: Jamie Brookes MD,  August 05, 2010 2:43 PM     Appended Document: phn msg pt stated that pcp was thinking of changing ambien meds. He said that pcp told him that it was not a medication that should be taken long term and that she was going to change this b/c it is not a med that should be taken long term  Appended Document: phn msg: Switched Ambien to Trazodone Medications Added TRAZODONE HCL 50 MG TABS (TRAZODONE HCL) take 1 tablet at night for sleep       Yes. I would love to switch him to Trazodone for sleep. If he is ready to make the switch then have him pick up the new Rx at the Pharmacy. I will send it in today. [name]  {DATETIMESTAMP()}    Clinical Lists Changes  Medications: Removed medication of AMBIEN 10 MG TABS (ZOLPIDEM TARTRATE) 1 tab by mouth at bedtime as needed insomnia Added new medication of TRAZODONE HCL 50 MG TABS (TRAZODONE HCL) take 1 tablet at night for sleep - Signed Rx of TRAZODONE HCL 50 MG TABS (TRAZODONE HCL) take 1 tablet at night for sleep;  #30 x 5;  Signed;  Entered by:  Jamie Brookes MD;  Authorized by: Jamie Brookes MD;  Method used: Electronically to [phone]*, [address], [place], [place], [place]  [address], Ph: [phone] or [phone], Fax: [phone]    Prescriptions: TRAZODONE HCL 50 MG TABS (TRAZODONE HCL) take 1 tablet at night for sleep  #30 x 5   Entered and Authorized by:   Jamie Brookes MD   Signed by:   Jamie Brookes MD on 08/08/2010   Method used:   Electronically to        [facility]  [address] [phone]* (retail)       [address]       [place], [place]  [address]       Ph: [phone] or [phone]       Fax: [phone]   RxID:   [id number]    Appended Document: phn msg pt called and informed of rx change
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
## 2010-12-27 NOTE — Progress Notes (Signed)
Summary: meds prob   Phone Note Refill Request Call back at [phone] Message from:  Patient  Refills Requested: Medication #1:  AMBIEN 10 MG TABS 1 tab by mouth at bedtime as needed insomnia called pharm 3 days ago and now pt is out  Initial call taken by: De Nurse,  July 07, 2010 2:01 PM  Follow-up for Phone Call        to pcp Follow-up by: Golden Circle RN,  July 07, 2010 2:02 PM  Additional Follow-up for Phone Call Additional follow up Details #1::        the last time this Rx was sent in was 06-06-10 and it had 5 refills. He should had some left. Also, this is not a good long-term sleep aid. I would like to change him to one that does not have the risk of causing vivid nightmares. I will call the patient but in the meantime he can call his pharmacy and see if he has refills because he should.  Additional Follow-up by: Jamie Brookes MD,  July 07, 2010 8:46 PM    Additional Follow-up for Phone Call Additional follow up Details #2::    states he called the pharmacy last night & they had filled it. they had told him earlier in the day that he had no refills. told him what md wrote about long term meds & change to another med. per note, she is going to call him Follow-up by: Golden Circle RN,  July 08, 2010 9:40 AM  Additional Follow-up for Phone Call Additional follow up Details #3:: Details for Additional Follow-up Action Taken: LM for patient and let him know to go ahead and pick up the Ambien for this month but that I would like to talk to him about switching to Trazodone. Asked him to call back and leave a message about when would be a good time to discuss this with him.  Additional Follow-up by: Jamie Brookes MD,  July 08, 2010 11:00 AM

## 2010-12-27 NOTE — Progress Notes (Signed)
Summary: Schedule INITIAL Beh-med   Phone Note Call from Patient   Caller: Patient Call For: Spero Geralds, Psy.D. Summary of Call: Patient called to schedule initial.  Reviewed Dr. Sherryll Burger note.  Scheduled for beh med clinic for 2/10 at 3:00.

## 2010-12-27 NOTE — Progress Notes (Signed)
Summary: Schedule [facility]DC   Phone Note Call from Patient   Caller: Patient Call For: Spero Geralds, Psy.D. Summary of Call: Patient called to schedule [facility].  I reminded him of our last conversation and the concerns he had about the medication recommendation Dr. Kathrynn Running made and my assessment.  He has options but does not want to see a psychiatrist.  First available for a new patient is June 1st.  He said he may be in [place] but would like so schedule anyway.  I asked him to call me if he would be unable to attend the appointment.  He voiced an understanding.  If he does not attend this appt or call with advance notice to cancel, I will not be able to schedule him in [facility] again.  Scheduled for 9:00 on June 1st. Initial call taken by: Spero Geralds PsyD,  March 10, 2010 4:23 PM

## 2010-12-27 NOTE — Assessment & Plan Note (Signed)
Summary: BILAT FEET PAIN X 2MOS/DIFFICULTY WALKING/STROTHER/[facility]   Vital Signs:  Patient profile:   50 year old male Pulse rate:   85 / minute BP sitting:   146 / 91  (right arm)  Vitals Entered By: Terese Door (August 08, 2010 1:54 PM) CC: Bilateral foot pain x 2 months   Primary Care Provider:  Marisue Ivan  MD  CC:  Bilateral foot pain x 2 months.  History of Present Illness:  B but R> L foot pain for 2 months, steaduily worsening. Right foot / ankle now hurts so bad he can barely walk----stopped playing golf several weeks ago as the swing puts a strain on his rigt foot (midswing during transition phase for weight).  No specific injury. 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 Has had some problems w right ankle before--about 6 months ago--self resolved.  Current Medications (verified): 1)  Viagra 100 Mg  Tabs (Sildenafil Citrate) .Marland Kitchen.. 1 Tab By Mouth 30 Mins Prior To Intercourse.  Max 1 Tab Daily 2)  Doxycycline Hyclate 100 Mg Tabs (Doxycycline Hyclate) .Marland Kitchen.. 1 Tab By Mouth Two Times A Day X 7 Days 3)  Lamotrigine 25 Mg Tabs (Lamotrigine) .... One Daily For Two Weeks and Then Two Daily.  Per Dr[facility] Kathrynn [facility]. 4)  Nadolol 20 Mg Tabs (Nadolol) .... Take One Daily.  Per Dr. Kathrynn Running in [facility]. 5)  Trazodone Hcl 50 Mg Tabs (Trazodone Hcl) .... Take 1 Tablet At Night For Sleep  Allergies: No Known Drug Allergies  Past History:  Past Medical History: Last updated: 01/28/2010 Mood Disorder NOS Impotence Insomnia  Past Surgical History: Last updated: 07/29/2008 Vasectomy Appendectomy abdominal hernia repair 2008  Family History: Last updated: 03/27/2008 mother- COPD  father- deceased when pt was 7, psych illness 2 sisters- healthy  Social History: Last updated: 08/16/2009 Lives in [place].  Single.  Has one child. 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  Occasional EtOH No drug use. No supplemental vitamins. Lifts weights for exercise  Review of Systems  The patient denies fever, weight loss, weight gain, and peripheral edema.    Physical Exam  General:  alert, well-developed, well-nourished, and well-hydrated.   Msk:  RIGHT ANKLE/FOOT: TTP dorsum, non focal. Dorsiflexion / plantar flexion strength and ROM normal. pain with passive inversion, pain with active eversion. Plantar fascia TTP along entire length.  LEFT ANKLE FOOT: ttp along plantar fascia at origin. Dorsiflexion and plantar flexion strength and ROM intact  NEURO intact soft touch sensation. SKIN no rashes or lesions or callous on feet PULSES 2+ B =   Impression & Recommendations:  Problem # 1:  PAIN IN JOINT, ANKLE AND FOOT (ICD-719.47)  B plantar fasciitis Right ankle pain--c/w chronic ATF strain.  He is having significant pain with walking. We attempted to place inserts in his shoe for pain releif but he still could not walk without sinificant pain so ultimately we decided on a cam walker boot for next 1 week. RTC --if no improvement would recommend imaging at that point Will start him on everion and inversion exercises with theraband. We also put a custom temp insole in his cam walker boot. he is in agreement wit this plan.  Orders: Pneumatic Walking Splint-Prefab (Cam Walker) [phone])  Complete Medication List: 1)  Viagra 100 Mg Tabs (Sildenafil citrate) .Marland Kitchen.. 1 tab by mouth 30 mins prior  to intercourse.  max 1 tab daily 2)  Doxycycline Hyclate 100 Mg Tabs (Doxycycline hyclate) .Marland Kitchen.. 1 tab by mouth two times a day x 7 days 3)  Lamotrigine 25 Mg Tabs (Lamotrigine) .... One daily for two weeks and then two daily.  per dr. Kathrynn Running in [facility]. 4)  Nadolol 20 Mg Tabs (Nadolol) .... Take one daily.  per dr. Kathrynn Running in [facility] clinic. 5)  Trazodone Hcl 50 Mg Tabs (Trazodone hcl) .... Take 1 tablet at night for sleep

## 2010-12-27 NOTE — Progress Notes (Signed)
Summary: Rx  AMbien refill   Phone Note Refill Request Call back at [phone]   Refills Requested: Medication #1:  AMBIEN 10 MG TABS 1 tab by mouth at bedtime as needed insomnia Initial call taken by: Knox Royalty,  June 06, 2010 1:37 PM  Follow-up for Phone Call        to PCP Follow-up by: Gladstone Pih,  June 06, 2010 2:17 PM    Prescriptions: AMBIEN 10 MG TABS (ZOLPIDEM TARTRATE) 1 tab by mouth at bedtime as needed insomnia  #30 x 5   Entered and Authorized by:   Jamie Brookes MD   Signed by:   Jamie Brookes MD on 06/06/2010   Method used:   Historical   RxID:   [id number]   Appended Document: Rx  AMbien refill [facility]

## 2010-12-27 NOTE — Assessment & Plan Note (Signed)
Summary: f/u s/p starting Celexa; new bronchitis and ankle sprain   Vital Signs:  Patient profile:   50 year old male Height:      69.2 inches Weight:      197.5 pounds BMI:     29.10 Temp:     98.2 degrees F oral Pulse rate:   87 / minute BP sitting:   141 / 88  (left arm) Cuff size:   regular  Vitals Entered By: Gladstone Pih (March 10, 2010 3:31 PM) CC: F/U Is Patient Diabetic? No Pain Assessment Patient in pain? no        Primary Care Provider:  Marisue Ivan  MD  CC:  F/U.  History of Present Illness: 50yo M here to f/u after starting Celexa  Mood disorder NOS: MIssed his appt with Dr. Pascal Lux in mood disorder clinic.  States that he could not tolerate the Celexa.  It made him feel like "my skin was crawling" after taking it for 2 days.  Cough: Productive x 3 weeks.  Previously had fevers.  Intermittent chest pain.  Multiple sick contacts.    R ankle pain: x several weeks.  Localized to R lateral ankle below the lateral malleolus.  Denies any injury or trauma.  No swelling, redness, or ecchymosis.  Seems weak and to give out at times.  Not taking anything for pain.  Habits & Providers  Alcohol-Tobacco-Diet     Tobacco Status: never  Allergies: No Known Drug Allergies  Review of Systems      See HPI  Physical Exam  General:  VS Reviewed. Well appearing, NAD.  Lungs:  Normal respiratory effort, chest expands symmetrically. Lungs are clear to auscultation, no crackles or wheezes. Heart:  Normal rate and regular rhythm. S1 and S2 normal without gallop, murmur, click, rub or other extra sounds. Msk:  R ankle Inspection- no deformities, no erythema, ecchymosis, or effusion ROM- full flexion both planar and dorsal, nl inversion and eversion Palpation- neg squeeze test, nontender to palpation of malleolus or navicular; mild ttp of attachment of ATF ligament Psych:  appropriate affect not anxious appearing good eye contact   Impression &  Recommendations:  Problem # 1:  UNSPECIFIED EPISODIC MOOD DISORDER (ICD-296.90) Assessment Unchanged Pt did not tolerate the Celexa.  Plan to defer evaluation and treatment to Dr. Pascal Lux and Dr. Kathrynn Running in mood disorder clinic. Pt insistent that he does not have depression or mood disorder. He confirms that he is no threat to self or others.    Orders: [facility]- Est  Level 4 ([address])  Problem # 2:  UPPER RESPIRATORY INFECTION, ACUTE (ICD-465.9) Assessment: New Suspect that patient has bronchitis but he is so insistent upon receiving an antibiotic. I discussed the risks and benefits of an antibiotic. Plan to treat doxy x 7 days.  Orders: [facility]- Est  Level 4 ([address])  Problem # 3:  ANKLE SPRAIN, RIGHT (ICD-845.00) Assessment: New Hx and exam c/w grade 2 R lateral ankle sprain at the proximal aspect of the ATF ligament. Plan: Rest, ice, elevation, NSAIDs.  Ankle support with either ASO brace or ACE wrap.  He is start rehab exercises that were discussed in the office visit.  Will f/u as needed.  Orders: [facility]- Est  Level 4 ([address])  Complete Medication List: 1)  Viagra 100 Mg Tabs (Sildenafil citrate) .Marland Kitchen.. 1 tab by mouth 30 mins prior to intercourse.  max 1 tab daily 2)  Ambien 10 Mg Tabs (Zolpidem tartrate) .Marland Kitchen.. 1 tab by mouth at bedtime as  needed insomnia 3)  Doxycycline Hyclate 100 Mg Tabs (Doxycycline hyclate) .Marland Kitchen.. 1 tab by mouth two times a day x 7 days  Patient Instructions: 1)  Please schedule a visit with Dr. Pascal Lux in the mood disorder clinic. 2)  Started you on Doxy for your lung infection. 3)  REcommend ibuprofen 600mg  every 6 hours for the next 7-10 days and use ankle support when needed and start rehab exercises. Prescriptions: DOXYCYCLINE HYCLATE 100 MG TABS (DOXYCYCLINE HYCLATE) 1 tab by mouth two times a day x 7 days  #14 x 0   Entered and Authorized by:   Marisue Ivan  MD   Signed by:   Marisue Ivan  MD on 03/10/2010   Method used:   [facility]        [facility] [address] [phone] ([facility]       [address]       [place], [place]  [address]       Ph: [phone] or [phone]       Fax: [phone]   RxID:   [id number]

## 2010-12-27 NOTE — Progress Notes (Signed)
Summary: Medication question   Phone Note Call from Patient   Caller: Patient Call For: Spero Geralds, Psy.D. Summary of Call: Patient called to discuss medication recently prescribed to him by Dr. Burnadette Pop, in consultation with me and Dr. Kathrynn Running.  Patient read on-line some potential side effects and wonders why such a medication (Seroquel) would be prescribed.  Also had concerns about being diagnosed as Bipolar Disorder after one meeting with me.  Explained that Dr. Kathrynn Running would not recommend a medication in which he was not comfortable with its safety profile.  Discussed information found on message boards vs. the information from pharamcy.  Reviewed most common side effects.  Unable to quantify for him the incidence of less common side effects like "involuntary muscle movements" and "lip smacking."  Suggested that those would be good questions for Dr. Kathrynn Running.  Ideally he would have been seen in [facility] but he missed his appointment.  With regards to Bipolar Disorder, I did not give him that diagnosis.  I used Unspecified Episodic Mood Disorder to describe his symptoms.  In consultation with Dr. Kathrynn Running, we determined that Seroquel might be a good match mainly because of his issues with sleep, anxiety and irritability.  He agreed that this is what he hoped to treat as well.  I validated his concerns with regards to the medication and encouraged him to make the decision based on his comfort level.  He has an appt scheduled for [facility] on March 23rd.  He reported Dr. Burnadette Pop was calling in and he elected to take that phone call in order to get his opinion as well. Initial call taken by: Spero Geralds PsyD,  January 31, 2010 4:40 PM

## 2010-12-27 NOTE — Progress Notes (Signed)
Summary: meds prob: restarted ambien  Medications Added AMBIEN 10 MG TABS (ZOLPIDEM TARTRATE) take 1 tablet every night for sleep       Phone Note Call from Patient Call back at Home Phone [phone]   Caller: Patient Summary of Call: trazadone doesn't work at all and needs something different - restarted ambien b/c the other wouldn't work [facility] Initial call taken by: De Nurse,  August 12, 2010 10:57 AM  Follow-up for Phone Call        [place], will refill Remus Loffler if that works for him.  Follow-up by: Jamie Brookes MD,  August 12, 2010 11:18 AM    New/Updated Medications: AMBIEN 10 MG TABS (ZOLPIDEM TARTRATE) take 1 tablet every night for sleep Prescriptions: AMBIEN 10 MG TABS (ZOLPIDEM TARTRATE) take 1 tablet every night for sleep  #31 x 0   Entered and Authorized by:   Jamie Brookes MD   Signed by:   Jamie Brookes MD on 08/12/2010   Method used:   Printed then faxed to ...       [facility] [address] [phone]* (retail)       [address]       [place], [place]  [address]       Ph: [phone] or [phone]       Fax: [phone]   RxID:   [id number]   Appended Document: meds prob: restarted ambien rx faxed .

## 2010-12-27 NOTE — Miscellaneous (Signed)
   Clinical Lists Changes  Orders: Added new Referral order of Orthopedic Surgeon Referral (Ortho Surgeon) - Signed      Complete Medication List: 1)  Viagra 100 Mg Tabs (Sildenafil citrate) .Marland Kitchen.. 1 tab by mouth 30 mins prior to intercourse.  max 1 tab daily 2)  Doxycycline Hyclate 100 Mg Tabs (Doxycycline hyclate) .Marland Kitchen.. 1 tab by mouth two times a day x 7 days 3)  Lamotrigine 25 Mg Tabs (Lamotrigine) .... One daily for two weeks and then two daily.  per dr. Kathrynn Running in [facility] disorder clinic. 4)  Nadolol 20 Mg Tabs (Nadolol) .... Take one daily.  per dr. Kathrynn Running in [facility] clinic. 5)  Ambien 10 Mg Tabs (Zolpidem tartrate) .... Take 1 tablet every night for sleep

## 2010-12-27 NOTE — Consult Note (Signed)
[facility]: [facility]   Imported By: De Nurse 09/14/2010 15:21:21  _____________________________________________________________________  External Attachment:    Type:   Image     Comment:   External Document

## 2010-12-27 NOTE — Progress Notes (Signed)
Summary: Cancel [facility] appt   Phone Note Call from Patient   Caller: Patient Call For: Spero Geralds, Psy.D. Summary of Call: Patient called to cancel his [facility] appt tomorrow.  He had the appt time confused but will be unable to make it regardless because he is in [place] with his ailing mother.  He said he does not know when he will return and will call when he is ready.  Will alert PCP as well. Initial call taken by: Spero Geralds PsyD,  February 15, 2010 1:43 PM

## 2010-12-27 NOTE — Miscellaneous (Signed)
Summary: Nadolol refill   Clinical Lists Changes  Medications: Rx of NADOLOL 20 MG TABS (NADOLOL) Take one daily.  Per Dr. Kathrynn Running in [facility].;  #90 x 3;  Signed;  Entered by: Jamie Brookes MD;  Authorized by: Jamie Brookes MD;  Method used: Electronically to [facility] [phone]*, [address] [place], [place], [place]  [address], Ph: [phone] or [phone], Fax: [phone]    Prescriptions: NADOLOL 20 MG TABS (NADOLOL) Take one daily.  Per Dr. Kathrynn Running in [facility].  #90 x 3   Entered and Authorized by:   Jamie Brookes MD   Signed by:   Jamie Brookes MD on 06/24/2010   Method used:   [facility]ronically [facility]        [facility] [address] [phone]* (retail)       [address]       [place], [place]  [address]       Ph: [phone] or [phone]       Fax: [phone]   RxID:   [id number]

## 2010-12-27 NOTE — Progress Notes (Signed)
Summary: Rx Req   Phone Note Refill Request Call back at [phone] Message from:  Patient  Refills Requested: Medication #1:  AMBIEN 10 MG TABS 1 tab by mouth at bedtime as needed insomnia [facility] [place].  Initial call taken by: Clydell Hakim,  March 10, 2010 4:24 PM  Follow-up for Phone Call        Prescription is a written prescription.  Will leave at front office to be picked up. Follow-up by: Marisue Ivan  MD,  March 10, 2010 4:36 PM    Prescriptions: AMBIEN 10 MG TABS (ZOLPIDEM TARTRATE) 1 tab by mouth at bedtime as needed insomnia  #30 x 1   Entered and Authorized by:   Marisue Ivan  MD   Signed by:   Marisue Ivan  MD on 03/10/2010   Method used:   Print then Give to Patient   RxID:   [id number]

## 2010-12-27 NOTE — Progress Notes (Signed)
Summary: pls call   Phone Note Call from Patient Call back at [phone]   Caller: Patient Summary of Call: would like to talk to Dr L about meds from last week Initial call taken by: De Nurse,  January 31, 2010 1:39 PM    spoke with mr Obrien about concerns and he did not wish to discuss any issues with me, however he did say that when he researched the meds that were prescribed to him and was not pleased with the side effects that he may experience with the meds.Loralee Pacas CMA  January 31, 2010 1:58 PM  Appended Document: Refuses to take Seroquel- will start Celexa 20mg  for anxiety Medications Added CELEXA 20 MG TABS (CITALOPRAM HYDROBROMIDE) 1 tab by mouth qhs AMBIEN 10 MG TABS (ZOLPIDEM TARTRATE) 1 tab by mouth at bedtime as needed insomnia          Clinical Lists Changes  Medications: Changed medication from SEROQUEL 50 MG TABS (QUETIAPINE FUMARATE) 1 tab by mouth at bedtime  inc by 1 tablet every night until you get to 300mg  and stay at that dose to CELEXA 20 MG TABS (CITALOPRAM HYDROBROMIDE) 1 tab by mouth qhs - Signed Added new medication of AMBIEN 10 MG TABS (ZOLPIDEM TARTRATE) 1 tab by mouth at bedtime as needed insomnia - Signed Rx of CELEXA 20 MG TABS (CITALOPRAM HYDROBROMIDE) 1 tab by mouth qhs;  #30 x 1;  Signed;  Entered by: Marisue Ivan  MD;  Authorized by: Marisue Ivan  MD;  Method used: [facility] [address] [phone]*, [address], [place], [place], [place]  [address], Ph: [phone] or [phone], Fax: [phone] Rx of AMBIEN 10 MG TABS (ZOLPIDEM TARTRATE) 1 tab by mouth at bedtime as needed insomnia;  #30 x 1;  Signed;  Entered by: Marisue Ivan  MD;  Authorized by: Marisue Ivan  MD;  Method used: Print then Give to Patient    Prescriptions: AMBIEN 10 MG TABS (ZOLPIDEM TARTRATE) 1 tab by mouth at bedtime as needed insomnia  #30 x 1   Entered and Authorized by:   Marisue Ivan  MD   Signed by:   Marisue Ivan  MD on 01/31/2010   Method used:   Print then Give to Patient   RxID:   [id number] CELEXA 20 MG TABS (CITALOPRAM HYDROBROMIDE) 1 tab by mouth qhs  #30 x 1   Entered and Authorized by:   Marisue Ivan  MD   Signed by:   Marisue Ivan  MD on 01/31/2010   Method used:   Electronically to        [facility] [address] [phone]* (retail)       [address]       [place], [place]  [address]       Ph: [phone] or [phone]       Fax: [phone]   RxID:   [id number]  I had a lengthy discussion with Mr. Dicostanzo.  He has great concerns regarding the Seroquel and does not want to try it.  I reassured him that we would be monitoring him closely for side effects, nonetheless, he refuses.  Instead, I have offered to start him on Celexa for anxiety and will f/u in 2 weeks.  Will go ahead an refill his Remus Loffler as well........Marland KitchenMarisue Ivan, MD

## 2010-12-27 NOTE — Progress Notes (Signed)
   Phone Note Outgoing Call   Summary of Call: Jason Williams please tell im the x rays were ok--showed mild degenerative changes only. Is what I expected Thanks!  Denny Levy MD  August 16, 2010 8:43 AM     done

## 2010-12-29 NOTE — Progress Notes (Signed)
Summary: phn msg   Phone Note Call from Patient Call back at Home Phone [phone]   Caller: Patient Summary of Call: pt made appt for Thurs at 4:15 and wants to talk to [facility] about his labs Initial call taken by: De Nurse,  November 04, 2010 4:07 PM  Follow-up for Phone Call        that sounds good, we can discuss starting a statin. His Cholesterol is quite high.  Follow-up by: Jamie Brookes MD,  November 06, 2010 8:35 PM

## 2010-12-29 NOTE — Progress Notes (Signed)
Summary: phn msg  Medications Added SIMVASTATIN 40 MG TABS (SIMVASTATIN) take 1 pill every night for cholesterol BUFFERIN LOW DOSE 81 MG TABS (ASPIRIN BUF(CACARB-MGCARB-MGO)) take 1 pill daily to thin blood to help to prevent heart attack and stroke       Phone Note Call from Patient Call back at Home Phone [phone]   Caller: Patient Summary of Call: pt wants to know if something can be called in for his cholesterol - isn't sure if he needs to come in.  would like to talk to [place] before Thurs if possible. Initial call taken by: De Nurse,  November 07, 2010 10:53 AM  Follow-up for Phone Call        Paul Smiths, I talked to him about his meds. I don't need to see him for this. we discussed it during his last office visit. If he already has an appointment please cancel it. He will need to see me in february for a follow up visit and a lab visit a few days prior to that. Thanks.  Follow-up by: Jamie Brookes MD,  November 07, 2010 11:06 AM    New/Updated Medications: SIMVASTATIN 40 MG TABS (SIMVASTATIN) take 1 pill every night for cholesterol BUFFERIN LOW DOSE 81 MG TABS (ASPIRIN BUF(CACARB-MGCARB-MGO)) take 1 pill daily to thin blood to help to prevent heart attack and stroke Prescriptions: BUFFERIN LOW DOSE 81 MG TABS (ASPIRIN BUF(CACARB-MGCARB-MGO)) take 1 pill daily to thin blood to help to prevent heart attack and stroke  #90 x 3   Entered and Authorized by:   Jamie Brookes MD   Signed by:   Jamie Brookes MD on 11/07/2010   Method used:   Electronically to        [facility] [address] [phone]* (retail)       [address]       [place], [place]  [address]       Ph: [phone] or [phone]       Fax: [phone]   RxID:   [id number] SIMVASTATIN 40 MG TABS (SIMVASTATIN) take 1 pill every night for cholesterol  #31 x 7   Entered and Authorized by:   Jamie Brookes MD   Signed by:   Jamie Brookes MD on 11/07/2010   Method used:    Electronically to        [facility] [address] [phone]* (retail)       [address]       [place], [place]  [address]       Ph: [phone] or [phone]       Fax: [phone]   RxID:   [id number]

## 2010-12-29 NOTE — Progress Notes (Signed)
Summary: Mail order pharmacy change   Phone Note Call from Patient Call back at Home Phone [phone]   Summary of Call: [facility], they will need new rx for ambien and lorazepam so they can send off to mail order pharmacy, pt asking for 90 day supply so it will be cheaper. [facility] [phone]  Initial call taken by: Knox Royalty,  December 05, 2010 10:22 AM  Follow-up for Phone Call        note, will [facility] Follow-up by: Jamie Brookes MD,  December 06, 2010 9:37 AM    Prescriptions: LORAZEPAM 0.5 MG TABS (LORAZEPAM) take 1 pill at times of great anxiety  #30 x 0   Entered and Authorized by:   Jamie Brookes MD   Signed by:   Jamie Brookes MD on 12/06/2010   Method used:   Printed then faxed to ...       [facility] [address] [phone]* (retail)       [address]       [place], [place]  [address]       Ph: [phone] or [phone]       Fax: [phone]   RxID:   [id number] AMBIEN 10 MG TABS (ZOLPIDEM TARTRATE) take 1 tablet every night for sleep  #90 x 1   Entered and Authorized by:   Jamie Brookes MD   Signed by:   Jamie Brookes MD on 12/06/2010   Method used:   Printed then faxed to ...       [facility] [address] [phone]* (retail)       [address]       [place], [place]  [address]       Ph: [phone] or [phone]       Fax: [phone]   RxID:   [id number]

## 2010-12-29 NOTE — Letter (Signed)
Summary: [facility]  [facility] referral   Imported By: Marily Memos 11/10/2010 10:25:21  _____________________________________________________________________  External Attachment:    Type:   Image     Comment:   External Document

## 2010-12-29 NOTE — Assessment & Plan Note (Signed)
Summary: [facility]   Vital Signs:  Patient profile:   50 year old male BP sitting:   140 / 90  Vitals Entered By: Rochele Pages RN (December 02, 2010 10:16 AM)  Primary Care Provider:  Jamie Brookes MD   History of Present Illness: f/u B plantar fascia pain--has had recent plantar fascia release on right--done in October. Some better. wants to go ahead and get some orthotics as he is comingout of the boot and going  into shoes. the left foot is hurting a little more--he has been preferentially bearing weight ion that side. no loss of sensation, no new signs.  Current Medications (verified): 1)  Viagra 100 Mg  Tabs (Sildenafil Citrate) .Marland Kitchen.. 1 Tab By Mouth 30 Mins Prior To Intercourse.  Max 1 Tab Daily 2)  Ambien 10 Mg Tabs (Zolpidem Tartrate) .... Take 1 Tablet Every Night For Sleep 3)  Sertraline Hcl 25 Mg Tabs (Sertraline Hcl) .... Take 1 Pill Daily (Morning or Evening) 4)  Lorazepam 0.5 Mg Tabs (Lorazepam) .... Take 1 Pill At Times of Great Anxiety 5)  Simvastatin 40 Mg Tabs (Simvastatin) .... Take 1 Pill Every Night For Cholesterol 6)  Bufferin Low Dose 81 Mg Tabs (Aspirin Buf(Cacarb-Mgcarb-Mgo)) .... Take 1 Pill Daily To Thin Blood To Help To Prevent Heart Attack and Stroke  Allergies: No Known Drug Allergies  Review of Systems  The patient denies fever.    Physical Exam  General:  alert, well-developed, well-nourished, and well-hydrated.   Msk:  GAIT: primarily a forefoot walker, bearing little weight on his heels.  FEET: forefoot varus B, well healed right plantar fascia scar. TTP B heels plantar surface diffusely neurovascualrly intact Additional Exam:  Patient was fitted for a : standard, cushioned, semi-rigid orthotic. The orthotic was heated and afterward the patient stood on the orthotic blank positioned on the orthotic stand. The patient was positioned in subtalar neutral position and 10 degrees of ankle dorsiflexion in a weight bearing stance. After completion  of molding, a stable base was applied to the orthotic blank. The blank was ground to a stable position for weight bearing.  Base:Blue swirl with blue EVA  Posting: none    Impression & Recommendations:  Problem # 1:  PLANTAR FASCIITIS, BILATERAL (ICD-728.71)  40 minutes face to face time spent in construction of custom molded orthotics  Orders: Orthotic Materials, each unit (Z6109)  Complete Medication List: 1)  Viagra 100 Mg Tabs (Sildenafil citrate) .Marland Kitchen.. 1 tab by mouth 30 mins prior to intercourse.  max 1 tab daily 2)  Ambien 10 Mg Tabs (Zolpidem tartrate) .... Take 1 tablet every night for sleep 3)  Sertraline Hcl 25 Mg Tabs (Sertraline hcl) .... Take 1 pill daily (morning or evening) 4)  Lorazepam 0.5 Mg Tabs (Lorazepam) .... Take 1 pill at times of great anxiety 5)  Simvastatin 40 Mg Tabs (Simvastatin) .... Take 1 pill every night for cholesterol 6)  Bufferin Low Dose 81 Mg Tabs (Aspirin buf(cacarb-mgcarb-mgo)) .... Take 1 pill daily to thin blood to help to prevent heart attack and stroke   Orders Added: 1)  Est. Patient Level IV [60454] 2)  Orthotic Materials, each unit [L3002]

## 2010-12-29 NOTE — Progress Notes (Signed)
Summary: referral   Phone Note Call from Patient Call back at Home Phone [phone]   Reason for Call: Referral Summary of Call: pt wants to know if MD would do referral for sleep study without an office visit? Initial call taken by: Jason Williams,  December 19, 2010 4:02 PM  Follow-up for Phone Call        I will need to see him. We did not about insomnia at his last visit but I need to ask him some questions about sleep apnea. It will be a short visit but I do need to talk to him so I get the correct info on his referral. Please let him know. Thanks.  Follow-up by: Jason Brookes MD,  December 21, 2010 9:44 PM  Additional Follow-up for Phone Call Additional follow up Details #1::        spoke with pt and he will call back to make an appointment for a sleep study referral Additional Follow-up by: Jason Williams, CMA,  December 22, 2010 11:20 AM

## 2010-12-29 NOTE — Consult Note (Signed)
Summary: [facility]  [facility]   Imported By: Marily Memos 11/11/2010 08:56:13  _____________________________________________________________________  External Attachment:    Type:   Image     Comment:   External Document

## 2010-12-30 ENCOUNTER — Ambulatory Visit: Payer: Self-pay | Admitting: Family Medicine

## 2011-01-02 ENCOUNTER — Ambulatory Visit (INDEPENDENT_AMBULATORY_CARE_PROVIDER_SITE_OTHER): Payer: BC Managed Care – PPO | Admitting: Family Medicine

## 2011-01-02 ENCOUNTER — Encounter: Payer: Self-pay | Admitting: Family Medicine

## 2011-01-02 DIAGNOSIS — R0609 Other forms of dyspnea: Secondary | ICD-10-CM | POA: Insufficient documentation

## 2011-01-02 DIAGNOSIS — R0989 Other specified symptoms and signs involving the circulatory and respiratory systems: Secondary | ICD-10-CM

## 2011-01-12 ENCOUNTER — Telehealth: Payer: Self-pay | Admitting: Family Medicine

## 2011-01-12 NOTE — Assessment & Plan Note (Signed)
Summary: insomnia/snoring, Anxiety, HLD   Vital Signs:  Patient profile:   50 year old male Height:      69.2 inches Weight:      201.8 pounds BMI:     29.74 Temp:     97.9 degrees F oral Pulse rate:   64 / minute BP sitting:   138 / 95  (left arm) Cuff size:   large  Vitals Entered By: Jimmy Footman, CMA (January 02, 2011 8:48 AM) CC: SNoring, Anxiety, HLD Is Patient Diabetic? No Pain Assessment Patient in pain? no        Primary Care Provider:  Jamie Brookes MD  CC:  SNoring, Anxiety, and HLD.  History of Present Illness: Snoring: Pt says he has had snoring for years. Hie significant other sleep in another room than him because the snoring is so loud. Sig other says that the snoring sometimes stops at night and then he starts up again. Despite being on Ambien he wakes up feeling sleepy. He has been talking to a friend and wonders if he needs a sleep study?  Anxiety: Pt suffers with anxiety and doesn't like to talk about it but says that the Xanax helps him some but does not last long enough. He also says that the Zoloft made him feel very tense. He tried to take the medicine several times. He would take zoloft for 4 days and start feeling so tight and tense that he would stop and then try it again later.   Hyperlipidemia: Pt had high cholesterol when tested recently. He has been taking the Zocor without problems. He wants to know if it is helping.   Habits & Providers  Alcohol-Tobacco-Diet     Tobacco Status: never  Current Medications (verified): 1)  Viagra 100 Mg  Tabs (Sildenafil Citrate) .Marland Kitchen.. 1 Tab By Mouth 30 Mins Prior To Intercourse.  Max 1 Tab Daily 2)  Ambien 10 Mg Tabs (Zolpidem Tartrate) .... Take 1 Tablet Every Night For Sleep 3)  Diazepam 2 Mg Tabs (Diazepam) .... Take 1 Pill Every 6-8 Hours As Needed For Anxiety 4)  Simvastatin 40 Mg Tabs (Simvastatin) .... Take 1 Pill Every Night For Cholesterol 5)  Bufferin Low Dose 81 Mg Tabs (Aspirin  Buf(Cacarb-Mgcarb-Mgo)) .... Take 1 Pill Daily To Thin Blood To Help To Prevent Heart Attack and Stroke 6)  Tramadol Hcl 50 Mg Tabs (Tramadol Hcl) .... Take 1 Tablet Every 6 Hours As Needed For Foot Pain  Allergies (verified): No Known Drug Allergies  Review of Systems        vitals reviewed and pertinent negatives and positives seen in HPI   Physical Exam  General:  Well-developed,well-nourished,in no acute distress; alert,appropriate and cooperative throughout examination Lungs:  Normal respiratory effort, chest expands symmetrically. Lungs are clear to auscultation, no crackles or wheezes. Heart:  Normal rate and regular rhythm. S1 and S2 normal without gallop, murmur, click, rub or other extra sounds. Psych:  depressed affect.     Impression & Recommendations:  Problem # 1:  SNORING (ICD-786.09) Assessment New it sounds as if the patient has sleep apnea. Will plan to sleep study.Pt is to continue taking his sleep meds.   Orders: Sleep Study (Sleep Study) [facility]- Est  Level 4 (26834)  Problem # 2:  ANXIETY STATE, UNSPECIFIED (ICD-300.00) Assessment: Deteriorated Pt did not do well with the Zoloft and he has insomnia so Celexa is likely not a good fit for him. He is doing well with the [facility] but saying that  it wears off too fast for him. He would like to try a longer acting medicine. Will start Diazepam. After he gets his sleep study would consider starting another SSRI (Paxil or Lexipro) or SNRI to see if we can get him better controlled.  GAD 7 score 18 with very Difficult rating of daily activities.   The following medications were removed from the medication list:    Sertraline Hcl 25 Mg Tabs (Sertraline hcl) .Marland Kitchen... Take 1 pill daily (morning or evening) His updated medication list for this problem includes:    Diazepam 2 Mg Tabs (Diazepam) .Marland Kitchen... Take 1 pill every 6-8 hours as needed for anxiety  Orders: [facility]- Est  Level 4 (99214)  Problem # 3:  HYPERLIPIDEMIA, MILD  (ICD-272.4) Assessment: Unchanged Plan to retest in the next coupleof months. Future order done. Pt is to continue his meds.   His updated medication list for this problem includes:    Simvastatin 40 Mg Tabs (Simvastatin) .Marland Kitchen... Take 1 pill every night for cholesterol  Orders: [facility]- Est  Level 4 (99214)Future Orders: Direct LDL-[facility] ([id number]) ... 01/23/2012  Complete Medication List: 1)  Viagra 100 Mg Tabs (Sildenafil citrate) .Marland Kitchen.. 1 tab by mouth 30 mins prior to intercourse.  max 1 tab daily 2)  Ambien 10 Mg Tabs (Zolpidem tartrate) .... Take 1 tablet every night for sleep 3)  Diazepam 2 Mg Tabs (Diazepam) .... Take 1 pill every 6-8 hours as needed for anxiety 4)  Simvastatin 40 Mg Tabs (Simvastatin) .... Take 1 pill every night for cholesterol 5)  Bufferin Low Dose 81 Mg Tabs (Aspirin buf(cacarb-mgcarb-mgo)) .... Take 1 pill daily to thin blood to help to prevent heart attack and stroke 6)  Tramadol Hcl 50 Mg Tabs (Tramadol hcl) .... Take 1 tablet every 6 hours as needed for foot pain  Patient Instructions: 1)  We are switching from Lorazepam to Diazepam (Valium) 2)  You can get your LDL chosterol checked in 2 months. Call the day before you want to get it tested to make a lab appoitnment and come in fasting from 9:00pm the night before (you can drink water only).  3)  I sent in a prescription for Tramadol. 4)  Make an appointment for [place] to just do skin check and biopsy.  Prescriptions: DIAZEPAM 2 MG TABS (DIAZEPAM) take 1 pill every 6-8 hours as needed for anxiety  #90 x 0   Entered and Authorized by:   Jamie Brookes MD   Signed by:   Jamie Brookes MD on 01/03/2011   Method used:   Handwritten   RxID:   [id number] TRAMADOL HCL 50 MG TABS (TRAMADOL HCL) take 1 tablet every 6 hours as needed for foot pain  #90 x 1   Entered and Authorized by:   Jamie Brookes MD   Signed by:   Jamie Brookes MD on 01/02/2011   Method used:   [facility]        [facility]  [address]  [phone]* (retail)       [address]       [place], [place]  [address]       Ph: [phone] or [phone]       Fax: [phone]   RxID:   [id number]    Orders Added: 1)  Sleep Study [Sleep Study] 2)  Direct LDL-[facility] [[id number]] 3)  [facility]- Est  Level 4 [26948]

## 2011-01-12 NOTE — Telephone Encounter (Signed)
Spoke with patient and explained to him how sleep referrals work. He was wanting you to know that his blood pressure has been running high at work. States that he was at work and "compared" to other worker whose BP was 120/82. The patient's BP was 140/92. He was just wanting you to know about this

## 2011-01-12 NOTE — Telephone Encounter (Signed)
Wants to know when he is supposed to go to sleep study- referral made 2/6

## 2011-01-12 NOTE — Telephone Encounter (Signed)
It appears that this patient's BP has been staying in the upper 130's systolically for sevearl years. I think it is time to start him on a BP medicine. Please have this patient make an appointment to have a hypertension visit so we can discuss starting a medicine. Thanks.

## 2011-01-20 ENCOUNTER — Telehealth: Payer: Self-pay | Admitting: Family Medicine

## 2011-01-20 NOTE — Telephone Encounter (Signed)
Spoke with patient about the sleep study referral. Informed that i have talked to [facility] and that no pre certification is needed and it has been faxed over again.Logan Bores, Roselyn Meier

## 2011-01-20 NOTE — Telephone Encounter (Signed)
Pt needs to speak with RN about his sleep study, someone has been trying to get this approved with his ins but is calling the wrong bcbs.

## 2011-01-24 ENCOUNTER — Encounter: Payer: Self-pay | Admitting: Family Medicine

## 2011-01-24 ENCOUNTER — Ambulatory Visit (INDEPENDENT_AMBULATORY_CARE_PROVIDER_SITE_OTHER): Payer: BC Managed Care – PPO | Admitting: Family Medicine

## 2011-01-24 VITALS — BP 126/87 | HR 81 | Temp 97.8°F | Ht 70.0 in | Wt 193.0 lb

## 2011-01-24 DIAGNOSIS — F39 Unspecified mood [affective] disorder: Secondary | ICD-10-CM

## 2011-01-24 DIAGNOSIS — I10 Essential (primary) hypertension: Secondary | ICD-10-CM | POA: Insufficient documentation

## 2011-01-24 MED ORDER — BUPROPION HCL 100 MG PO TABS: 100.0000 mg | ORAL_TABLET | Freq: Three times a day (TID) | ORAL | Status: DC

## 2011-01-24 MED ORDER — HYDROCHLOROTHIAZIDE 12.5 MG PO TABS: 12.5000 mg | ORAL_TABLET | Freq: Every day | ORAL | Status: DC

## 2011-01-24 NOTE — Patient Instructions (Signed)
We started you on Wellbutrin and HCTZ for BP.  Come back in 1 month to have those evaluated and to have a skin check.

## 2011-01-24 NOTE — Assessment & Plan Note (Signed)
Pt describes times of being depressed or angry and even though he does feel like hurting anyone he doesn't feel in control of his emotions either. Plan to try Wellbutrin with him for the depression. Will see him back in 4 week to see how he is doing on the medicine.

## 2011-01-24 NOTE — Assessment & Plan Note (Signed)
Pt is concerned about his BP because he checks it a work and it is often higher than today's BP. He can continue to monitor his BP. He has cut down on sodium. We decided to start a low dose BP pill and will recheck his BP at his next visit. In the mean time he will cont to monitor it at work.

## 2011-01-24 NOTE — Progress Notes (Signed)
  Subjective:    Patient ID: Jason Williams, male    DOB: September 01, 1961, 50 y.o.   MRN: [id number]  HPI Depression/Anger: Pt has tried different meds (Zoloft, Celexa) for his symptoms and they always make him feel different. The Celexa made him feel like his arms were crawling and the Zoloft made the back of his neck feel like it was tightening after 1 week of taking it. He has never stayed on the medicines for more than a few weeks before stopping them. He has met with Dr. Kathrynn Running in the past and was put on Lamotrigine and after reading the side effects he decided to not take that medicine.  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 He is concerned about his elevated BP.      Review of Systems Some numbness in his arms when he wakes up that resolves shortly after waking, some skin lesions he wants looked at, no other side effects or symptoms.     Objective:   Physical Exam  Constitutional: He appears well-developed and well-nourished.  Skin: Skin is warm and dry.       Pt has some flesh colored nevi and 1 SK on his back. There are no lesions with red bases or pearly while lesions.   Psychiatric: He has a normal mood and affect. His behavior is normal. Judgment and thought content normal.          Assessment & Plan:

## 2011-01-30 ENCOUNTER — Other Ambulatory Visit: Payer: Self-pay | Admitting: Family Medicine

## 2011-01-30 MED ORDER — SIMVASTATIN 40 MG PO TABS: 40.0000 mg | ORAL_TABLET | Freq: Every day | ORAL | Status: DC

## 2011-02-06 ENCOUNTER — Other Ambulatory Visit: Payer: Self-pay | Admitting: Family Medicine

## 2011-02-06 NOTE — Telephone Encounter (Signed)
Refill request

## 2011-02-09 ENCOUNTER — Other Ambulatory Visit: Payer: Self-pay | Admitting: Family Medicine

## 2011-02-09 MED ORDER — SERTRALINE HCL 25 MG PO TABS: 25.0000 mg | ORAL_TABLET | Freq: Every day | ORAL | Status: DC

## 2011-02-13 ENCOUNTER — Ambulatory Visit (HOSPITAL_BASED_OUTPATIENT_CLINIC_OR_DEPARTMENT_OTHER): Payer: BC Managed Care – PPO

## 2011-02-20 ENCOUNTER — Encounter: Payer: Self-pay | Admitting: Family Medicine

## 2011-02-20 ENCOUNTER — Ambulatory Visit (INDEPENDENT_AMBULATORY_CARE_PROVIDER_SITE_OTHER): Payer: Self-pay | Admitting: Family Medicine

## 2011-02-20 VITALS — BP 137/93 | HR 63 | Temp 97.8°F | Ht 69.0 in | Wt 190.6 lb

## 2011-02-20 DIAGNOSIS — F411 Generalized anxiety disorder: Secondary | ICD-10-CM

## 2011-02-20 DIAGNOSIS — R0789 Other chest pain: Secondary | ICD-10-CM | POA: Insufficient documentation

## 2011-02-20 DIAGNOSIS — E785 Hyperlipidemia, unspecified: Secondary | ICD-10-CM

## 2011-02-20 MED ORDER — BUPROPION HCL ER (XL) 300 MG PO TB24: 300.0000 mg | ORAL_TABLET | ORAL | Status: DC

## 2011-02-20 MED ORDER — DIAZEPAM 5 MG PO TABS: 5.0000 mg | ORAL_TABLET | Freq: Three times a day (TID) | ORAL | Status: DC | PRN

## 2011-02-20 NOTE — Assessment & Plan Note (Signed)
Pt is currently on a statin. His last cholsterol was 245 with an LDL of 153.  Will repeat his LDL (direct) when he is fasting since he has been on the statin about 3-4 months now.

## 2011-02-20 NOTE — Assessment & Plan Note (Addendum)
Pt has some anxiety and this could be contributing to his chest tightness but with his age I will be careful and send him to get a treadmill test. He exercises regularly so he will do well on the treadmill. Will refer to cards since we will not be able to get him in with our group for about 1 month.  Cont ASA Cont statin

## 2011-02-20 NOTE — Assessment & Plan Note (Signed)
Pt has been taking 3 Valium 2mg  tabs at a time to get relief.Plan to increase his dose and change to 5 mg tablets.

## 2011-02-20 NOTE — Patient Instructions (Signed)
We changed your Wellbutrin and increased your Valium.  I ordered a cardiology referral so you can get an exercise treadmill test. We will call you with your appointment time and date.

## 2011-02-20 NOTE — Progress Notes (Signed)
Depression: Pt is doing well on the wellbutrin. He occasionally forgets to take a pill but is mostly getting in 3 pills a day. He would like to get the extended release version if possible. No side effects noted except he doesn't sleep as well at night if he takes it too late so he tries to take the last one at 5pm.   Anxiety: Pt is still using the Valium but says he is taking up to 3 at a time to get relief from his anxiety at times. He wants to know if there is anything to help him control this better.  Chest tightness; Pt exercises on a regular bases. He lifts weights and rides the stationary bike. He says that this weekend he was doing work in the yard and noticed some tightness in his chest. He has noticed it with other physical exercise as well. He is a little concerned. He is taking a daily aspirin and is taking his cholesterol meds.   ROS: no report of shortness of breath, otherwise neg ROs except as noted above in HPi.   PE;  Gen: comfortably seated in chair.  CV: RRR, no murmurs, rubs or gallops.  Pulm: CTAB, no wheezes or crackles.  Psych: pt has a bit of a flat affect but has a normal mood.

## 2011-02-21 ENCOUNTER — Telehealth: Payer: Self-pay | Admitting: Family Medicine

## 2011-02-21 NOTE — Telephone Encounter (Signed)
Spoke with patient and informed that cardiology referral was faxed in. Also informed patient to make a lab appointment this week so we can do his fasting lab

## 2011-02-21 NOTE — Telephone Encounter (Signed)
Pt checking status of referral for stress test

## 2011-02-22 ENCOUNTER — Encounter: Payer: Self-pay | Admitting: *Deleted

## 2011-02-22 NOTE — Progress Notes (Unsigned)
  Subjective:    Patient ID: Jason Williams, male    DOB: 05-11-1961, 50 y.o.   MRN: [id number]  HPI    Review of Systems     Objective:   Physical Exam        Assessment & Plan:

## 2011-03-15 ENCOUNTER — Ambulatory Visit (HOSPITAL_BASED_OUTPATIENT_CLINIC_OR_DEPARTMENT_OTHER): Payer: BC Managed Care – PPO | Attending: Family Medicine

## 2011-03-15 DIAGNOSIS — G4737 Central sleep apnea in conditions classified elsewhere: Secondary | ICD-10-CM | POA: Insufficient documentation

## 2011-03-15 DIAGNOSIS — G4733 Obstructive sleep apnea (adult) (pediatric): Secondary | ICD-10-CM | POA: Insufficient documentation

## 2011-03-18 DIAGNOSIS — G4737 Central sleep apnea in conditions classified elsewhere: Secondary | ICD-10-CM

## 2011-03-18 DIAGNOSIS — G4733 Obstructive sleep apnea (adult) (pediatric): Secondary | ICD-10-CM

## 2011-03-19 NOTE — Procedures (Signed)
Jason Williams, Jason Williams              ACCOUNT NO.:  [id number]  MEDICAL RECORD NO.:  [id number]         PATIENT TYPE:  OUT  LOCATION:  SLEEP CENTER                 FACILITY:  [facility]  PHYSICIAN:  Kaidyn Hernandes D. Maple Hudson, MD, FCCP, FACPDATE OF BIRTH:  Feb 12, 1961  DATE OF STUDY:  03/15/2011                           NOCTURNAL POLYSOMNOGRAM  REFERRING PHYSICIAN:  AMBER STROTHER  INDICATIONS FOR STUDY:  Insomnia with sleep apnea.  EPWORTH SLEEPINESS SCORE:  2/24.  BMI 27, weight 188 pounds, height 70 inches, neck 16.5 inches.  HOME MEDICATION:  Charted and reviewed.  SLEEP ARCHITECTURE:  Total sleep time 256.5 minutes with sleep efficiency 69.5%.  Stage I was 12.3%, stage II 69.4%, stage III absent, REM 18.3% of total sleep time.  Sleep latency 44.5 minutes, REM latency 206 minutes, awake after sleep onset 68.5 minutes, arousal index 15.  BEDTIME MEDICATION:  Ambien.  RESPIRATORY DATA:  Apnea/hypopnea index (AHI) 7.7 per hour.  A total of 33 events was scored including 1 central apnea and 32 hypopneas.  Most events were recorded while supine.  REM AHI 5.1, RDI 8.4.  There were insufficient events to permit early establishment of diagnosis allowing application of CPAP titration by split protocol on this study night.  OXYGEN DATA:  Moderate snoring with oxygen desaturation to a nadir of 85% and a mean oxygen saturation through the study of 94.1% on room air.  CARDIAC DATA:  Normal sinus rhythm.  MOVEMENT/PARASOMNIA:  No significant movement disturbance.  Bathroom x1.  IMPRESSION/RECOMMENDATIONS: 1. Sleep complaint is of difficulty initiating and maintaining sleep.     On this study night, sleep onset was shortly after 23:00 p.m..  He     was then spontaneously awake from approximately 1-2 a.m.  Ambien     was taken at bedtime.  Additional attention to management of     insomnia may be useful. 2. 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  (Normal  range 0-5 per hour).  Events were more common     while supine and in REM and he may benefit from encouragement to     sleep off flat or back.  Moderate snoring with oxygen desaturation     to a nadir of 85% and mean oxygen saturation through the study of     94.1% on room air. 3. There were insufficient events on this study night to permit     application of CPAP titration by split protocol.  If     apnea events are considered an important cause of sleep disruption,     then conservative measures including side sleeping, possibly a chin     strap, or simple mouthpiece may be useful.     Wannetta Langland D. Maple Hudson, MD, [facility], FACP Diplomate, Biomedical engineer of Sleep Medicine Electronically Signed    CDY/MEDQ  D:  03/18/2011 09:14:19  T:  03/18/2011 18:52:10  Job:  [id number]

## 2011-03-20 ENCOUNTER — Telehealth: Payer: Self-pay | Admitting: Family Medicine

## 2011-03-20 NOTE — Telephone Encounter (Signed)
Called and informed patient of below.

## 2011-03-20 NOTE — Telephone Encounter (Signed)
I do not have the results from these test back yet.  I will call him with sleep study results when I get it.  Her would likely get faster results from the cardiology office about his stress test, b/c I don't have those results either.  I don't know if they are both in the "to be scanned" pile, but it may be a little while until I get those results in the computer if they are.  Please let him know. Thanks.

## 2011-03-20 NOTE — Telephone Encounter (Signed)
Pt asking for sleep study & stress test results.

## 2011-03-24 ENCOUNTER — Other Ambulatory Visit: Payer: Self-pay | Admitting: Family Medicine

## 2011-03-24 ENCOUNTER — Telehealth: Payer: Self-pay | Admitting: Family Medicine

## 2011-03-24 MED ORDER — ESOMEPRAZOLE MAGNESIUM 20 MG PO CPDR: 20.0000 mg | DELAYED_RELEASE_CAPSULE | Freq: Every day | ORAL | Status: DC

## 2011-03-24 NOTE — Telephone Encounter (Signed)
Spoke with Mr. Fritchman and asked for name of pharmacy for stomach meds for acid reflux.  [facility] [address] [facility] [place].  Ph# is [phone].

## 2011-03-24 NOTE — Telephone Encounter (Signed)
Jason Williams still haven't heard anything from provider regarding his stress and sleep study tests.  Also the meds given for his cholesterol, is causing him to have a lot of acidy indigestion like symptoms.  He wanted to speak with you also about this.

## 2011-03-26 NOTE — Telephone Encounter (Signed)
I sent in his Rx on Friday.  I don't have his results from the sleep and stress tests. If they have been sent over they probably haven't been scanned in yet. Let him know that he can call the cardiology office for the stress test results and I will let him know when I see the sleep study results.  Thanks.

## 2011-03-29 ENCOUNTER — Other Ambulatory Visit: Payer: Self-pay | Admitting: Family Medicine

## 2011-03-29 DIAGNOSIS — I10 Essential (primary) hypertension: Secondary | ICD-10-CM

## 2011-03-29 MED ORDER — HYDROCHLOROTHIAZIDE 12.5 MG PO TABS: 12.5000 mg | ORAL_TABLET | Freq: Every day | ORAL | Status: DC

## 2011-03-31 ENCOUNTER — Other Ambulatory Visit: Payer: Self-pay | Admitting: Family Medicine

## 2011-03-31 DIAGNOSIS — I10 Essential (primary) hypertension: Secondary | ICD-10-CM

## 2011-03-31 MED ORDER — HYDROCHLOROTHIAZIDE 12.5 MG PO TABS: 12.5000 mg | ORAL_TABLET | Freq: Every day | ORAL | Status: DC

## 2011-04-10 ENCOUNTER — Telehealth: Payer: Self-pay | Admitting: Family Medicine

## 2011-04-10 NOTE — Telephone Encounter (Signed)
Pt had sleep study done and the results were not very significant.  He states that he snores so loudly, no one can sleep in the same room with him.  He thinks that the study was inaccurate and wants to know what he can do now.  It's becoming a pretty big problem for him.

## 2011-04-11 ENCOUNTER — Other Ambulatory Visit: Payer: Self-pay | Admitting: Family Medicine

## 2011-04-11 DIAGNOSIS — F411 Generalized anxiety disorder: Secondary | ICD-10-CM

## 2011-04-11 MED ORDER — BUPROPION HCL ER (XL) 300 MG PO TB24: 300.0000 mg | ORAL_TABLET | ORAL | Status: DC

## 2011-04-11 NOTE — Telephone Encounter (Signed)
Left message for pt. Told him that his oxygen didn't go low enough to quality for CPAP, but there were devices that help with snoring such at the aveoTSD (tongue stabilizing device) or a chin strap that can help. Also, if he wants to talk to an ENT surgeon he could be evaluated for surgery. Asked him to let me know what he is interested in and I will get started on it. (chin strap is OTC I believe, but will need referral for ENT and Rx for aveoTSD).

## 2011-04-14 NOTE — Telephone Encounter (Signed)
Pt would like to have the aveoTSD (tongue stabilizing device).  Would like a call back regarding this if you will

## 2011-04-14 NOTE — Op Note (Signed)
NAMEKEVONTE, Williams              ACCOUNT NO.:  [id number]   MEDICAL RECORD NO.:  [id number]          PATIENT TYPE:  AMB   LOCATION:  [facility]                         FACILITY:  [facility]   PHYSICIAN:  Anselm Pancoast. Weatherly, M.D.DATE OF BIRTH:  09-Mar-1961   DATE OF PROCEDURE:  03/19/2007  DATE OF DISCHARGE:                               OPERATIVE REPORT   PREOPERATIVE DIAGNOSIS:  Probably stitch abscess, upper abdomen, 20  years status post ruptured appendicitis.   OPERATION:  Exploration of wound above the umbilicus, removal of large  stitch and chronic abscess. General anesthesia endotracheal tube.   SURGEON:  Anselm Pancoast. Zachery Dakins, M.D.   HISTORY:  Jason Williams is a 50 year old male who when he was 50 years  old had a ruptured appendicitis was explored through a midline incision  and for the first several years after the appendectomy had several areas  of stitches that required removal in the office and then actually was  explored through a little right lateral incision for a deeper abscess  probably about 5 years after the surgery.  He went fine for about 10 of  12 years and then last summer as he was working on his boat, bending  over and all had a little area of drainage.  He has had persistent  intermittent drainage since then.  I saw him in the office in I think  January we got a CT did not show any abnormalities.  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  The  patient has been on antibiotics a time or two with no improvement and I  recommended we explore the wound today with general anesthesia.  The  patient was given a gram of Ancef preoperatively and taken to the  operative suite.  Induction of general anesthesia.  His white blood  count was normal.  He was positioned on table and  endotracheal tube  position.  The abdomen was clipped around the area.  There is a  kind of  chronic draining sinus with a little bit of granulation tissue around  it.  I then prepped it with Betadine solution and made a little  elliptical incision starting at the midline incision kind of going below  the midline incision and removed a little strip of fusiform strip about  inch in length.  I then used predominantly cautery and scalpel to free  up this chronic granulation tract and went down to the fascia.  I then  actually opened the fascia, again taking the area and then when I was in  the intraperitoneal or actually intra-abdominally but extraperitoneal,  there was an obvious little abscess there was probably about a good  tablespoon of frank purulence.  I found a large what looks like black  silk suture I am not sure it is bigger than 0 I am not sure what type of  suture it is that was broken and I did aerobic and anaerobic cultures.  The chronic little abscess cavity was removed.  It was a little area  within the peritoneum here, but fortunately there was no bowel adherent  to the area and then I got good hemostasis with 3-0 chromic sutures and  then closed the wound with knots inverted four sutures of 0-0 Prolene.  The skin is approximated pretty well and I put about three staples at  the top, two at the bottom and left the little center third opened in  case there would be drainage and we will place him on Keflex awaiting  the results of the culture.  I will see him back in the office on Monday  and if there would be any redness, tenderness or signs of infection, we  will remove the staples promptly.  The patient tolerated procedure  nicely.  I did not put anesthetic solution in the area  because of the infection.  There was a little skin papillomata the  lateral part of the incision I was thinking about removing but since  there is infection I think if he wants that removed, I will snip it off  in the office after everything is well healed.            ______________________________  Anselm Pancoast. Zachery Dakins, M.D.     WJW/MEDQ  D:  03/19/2007  T:  03/19/2007  Job:  [id number]

## 2011-04-17 ENCOUNTER — Other Ambulatory Visit: Payer: Self-pay | Admitting: Family Medicine

## 2011-04-17 DIAGNOSIS — F411 Generalized anxiety disorder: Secondary | ICD-10-CM

## 2011-04-17 DIAGNOSIS — I10 Essential (primary) hypertension: Secondary | ICD-10-CM

## 2011-04-17 MED ORDER — DIAZEPAM 10 MG PO TABS: 10.0000 mg | ORAL_TABLET | Freq: Three times a day (TID) | ORAL | Status: DC | PRN

## 2011-04-17 MED ORDER — SIMVASTATIN 40 MG PO TABS: 40.0000 mg | ORAL_TABLET | Freq: Every day | ORAL | Status: DC

## 2011-04-17 MED ORDER — ZOLPIDEM TARTRATE 10 MG PO TABS: 10.0000 mg | ORAL_TABLET | Freq: Every day | ORAL | Status: DC

## 2011-04-17 MED ORDER — SERTRALINE HCL 25 MG PO TABS: 25.0000 mg | ORAL_TABLET | Freq: Every day | ORAL | Status: DC

## 2011-04-17 MED ORDER — ESOMEPRAZOLE MAGNESIUM 20 MG PO CPDR: 20.0000 mg | DELAYED_RELEASE_CAPSULE | Freq: Every day | ORAL | Status: DC

## 2011-04-17 MED ORDER — HYDROCHLOROTHIAZIDE 12.5 MG PO TABS: 12.5000 mg | ORAL_TABLET | Freq: Every day | ORAL | Status: DC

## 2011-04-17 MED ORDER — BUPROPION HCL ER (XL) 300 MG PO TB24: 300.0000 mg | ORAL_TABLET | ORAL | Status: DC

## 2011-04-17 MED ORDER — TRAMADOL HCL 50 MG PO TABS: 50.0000 mg | ORAL_TABLET | Freq: Four times a day (QID) | ORAL | Status: DC | PRN

## 2011-04-17 NOTE — Telephone Encounter (Signed)
Pt wanted a 3 month supply of his meds before I left so he could have time to meet his new doctor and get to know them before needing refills.  90 day supply meds ordered by phone talking with the pharmacist.

## 2011-04-17 NOTE — Telephone Encounter (Signed)
Does he need an order for this?

## 2011-04-25 NOTE — Telephone Encounter (Signed)
I have sent in a message to the company that makes them asking them for the order form to fill out. I will fill it out and send it in as soon as I get the form.

## 2011-04-25 NOTE — Telephone Encounter (Signed)
lvm to inform pt that the form he needs is up front and that he will need to fill in his CC information and either we can fax it or he can.Marland KitchenMarland KitchenLoralee Pacas Lynetta]

## 2011-04-25 NOTE — Telephone Encounter (Signed)
Please let this patient know that he can pick up the order form and I have filled out at the front office. He will need to fill in his credit card info and we can fax the form or he can fax it in. This is for the tongue stabilizing device.  Thanks, Hospital doctor

## 2011-08-10 ENCOUNTER — Other Ambulatory Visit: Payer: Self-pay | Admitting: Family Medicine

## 2011-08-10 NOTE — Telephone Encounter (Signed)
Refill request

## 2011-08-15 ENCOUNTER — Ambulatory Visit (INDEPENDENT_AMBULATORY_CARE_PROVIDER_SITE_OTHER): Payer: BC Managed Care – PPO | Admitting: Family Medicine

## 2011-08-15 ENCOUNTER — Encounter: Payer: Self-pay | Admitting: Family Medicine

## 2011-08-15 DIAGNOSIS — M25571 Pain in right ankle and joints of right foot: Secondary | ICD-10-CM

## 2011-08-15 DIAGNOSIS — Z23 Encounter for immunization: Secondary | ICD-10-CM

## 2011-08-15 DIAGNOSIS — Z1211 Encounter for screening for malignant neoplasm of colon: Secondary | ICD-10-CM

## 2011-08-15 DIAGNOSIS — F411 Generalized anxiety disorder: Secondary | ICD-10-CM

## 2011-08-15 DIAGNOSIS — G4700 Insomnia, unspecified: Secondary | ICD-10-CM

## 2011-08-15 DIAGNOSIS — E785 Hyperlipidemia, unspecified: Secondary | ICD-10-CM

## 2011-08-15 DIAGNOSIS — I10 Essential (primary) hypertension: Secondary | ICD-10-CM

## 2011-08-15 DIAGNOSIS — M25579 Pain in unspecified ankle and joints of unspecified foot: Secondary | ICD-10-CM

## 2011-08-15 LAB — LIPID PANEL
Cholesterol: 187 mg/dL (ref 0–200)
HDL: 41 mg/dL (ref >39)
LDL Cholesterol: 109 mg/dL — ABNORMAL HIGH (ref 0–99)
Total CHOL/HDL Ratio: 4.6 Ratio
Triglycerides: 187 mg/dL — ABNORMAL HIGH (ref <150)
VLDL: 37 mg/dL (ref 0–40)

## 2011-08-15 MED ORDER — SIMVASTATIN 40 MG PO TABS: 40.0000 mg | ORAL_TABLET | Freq: Every day | ORAL | Status: DC

## 2011-08-15 MED ORDER — SERTRALINE HCL 25 MG PO TABS: 25.0000 mg | ORAL_TABLET | Freq: Every day | ORAL | Status: DC

## 2011-08-15 MED ORDER — ZOLPIDEM TARTRATE 10 MG PO TABS: 10.0000 mg | ORAL_TABLET | Freq: Every day | ORAL | Status: DC

## 2011-08-15 MED ORDER — TRAMADOL HCL 50 MG PO TABS: 50.0000 mg | ORAL_TABLET | Freq: Four times a day (QID) | ORAL | Status: DC | PRN

## 2011-08-15 MED ORDER — DIAZEPAM 10 MG PO TABS: 10.0000 mg | ORAL_TABLET | Freq: Three times a day (TID) | ORAL | Status: DC | PRN

## 2011-08-15 MED ORDER — HYDROCHLOROTHIAZIDE 12.5 MG PO TABS: 12.5000 mg | ORAL_TABLET | Freq: Every day | ORAL | Status: DC

## 2011-08-15 NOTE — Progress Notes (Signed)
  Subjective:    Patient ID: Jason Williams, male    DOB: Aug 19, 1961, 50 y.o.   MRN: [id number]  HPI 1. Anxiety Patient has suffered from anxiety for a long time. He takes zoloft and valium prn with good results. He has not had any panic attacks. He has a strong family history of mental disease. He doesn't have any history of substance abuse.  2. HLD On simvistatin. Modified his diet.   3. Chronic right ankle pain Reconstructive surgery of right ankle for osteoarthritis. He takes tramadol for pain from movement.  4. Screening for Colon CA No blood in stool, no abdominal pain, no weight loss, no family history of colon ca. No pencil stools.   5. Flu shot  6. Insomnia Good sleep hygiene. Does look at blue light (lap top, cell phone) before sleep. Does drink caffeine at dinner. Taking ambien.  Review of Systems  Constitutional: Negative for fever, fatigue and unexpected weight change.  Gastrointestinal: Negative for abdominal pain and blood in stool.  Neurological: Negative for dizziness and headaches.  Psychiatric/Behavioral: Positive for sleep disturbance. Negative for suicidal ideas, hallucinations, behavioral problems and agitation. The patient is nervous/anxious.        Objective:   Physical Exam  Nursing note and vitals reviewed. Constitutional: He appears well-developed and well-nourished. No distress.  Cardiovascular: Normal rate, regular rhythm and normal heart sounds.   No murmur heard. Pulmonary/Chest: Effort normal and breath sounds normal. No respiratory distress. He has no wheezes.  Skin: He is not diaphoretic.  Psychiatric: He has a normal mood and affect. His behavior is normal. Judgment and thought content normal.      Assessment & Plan:  1. Anxiety C/w valium prn and zoloft  2. HLD On simvistatin.  Fasting lipid panel today  3. Chronic right ankle pain C/w tramadol q 6 hours.  4. Screening for Colon CA colonoscopy  5. Flu shot administered  6.  Insomnia Counseled on sleep hygiene including no blue light/caffeine before bedtime. C/w ambien (doesnt take with valium, or etoh)

## 2011-08-16 ENCOUNTER — Other Ambulatory Visit: Payer: Self-pay | Admitting: Family Medicine

## 2011-08-16 ENCOUNTER — Telehealth: Payer: Self-pay | Admitting: Family Medicine

## 2011-08-16 NOTE — Telephone Encounter (Signed)
Left message about his Lipid panel results and options including continuing simvastatin or switching to better Statin IE crestor/lipitor

## 2011-08-16 NOTE — Telephone Encounter (Signed)
Would like to switch if his insurance will pay.  - would like generic if possible [facility]- [facility]

## 2011-08-17 ENCOUNTER — Telehealth: Payer: Self-pay | Admitting: Family Medicine

## 2011-08-17 DIAGNOSIS — E785 Hyperlipidemia, unspecified: Secondary | ICD-10-CM

## 2011-08-17 MED ORDER — ROSUVASTATIN CALCIUM 20 MG PO TABS: 20.0000 mg | ORAL_TABLET | Freq: Every day | ORAL | Status: DC

## 2011-08-17 NOTE — Telephone Encounter (Signed)
Refill request

## 2011-10-27 ENCOUNTER — Other Ambulatory Visit: Payer: Self-pay | Admitting: Family Medicine

## 2011-10-27 DIAGNOSIS — E785 Hyperlipidemia, unspecified: Secondary | ICD-10-CM

## 2011-10-27 MED ORDER — ROSUVASTATIN CALCIUM 20 MG PO TABS: 20.0000 mg | ORAL_TABLET | Freq: Every day | ORAL | Status: DC

## 2011-11-10 ENCOUNTER — Other Ambulatory Visit: Payer: Self-pay | Admitting: Family Medicine

## 2011-11-10 NOTE — Telephone Encounter (Signed)
Refill request

## 2011-11-14 ENCOUNTER — Telehealth: Payer: Self-pay | Admitting: Family Medicine

## 2011-11-14 DIAGNOSIS — G4700 Insomnia, unspecified: Secondary | ICD-10-CM

## 2011-11-14 DIAGNOSIS — F411 Generalized anxiety disorder: Secondary | ICD-10-CM

## 2011-11-14 MED ORDER — DIAZEPAM 10 MG PO TABS: 10.0000 mg | ORAL_TABLET | Freq: Three times a day (TID) | ORAL | Status: DC | PRN

## 2011-11-14 MED ORDER — ZOLPIDEM TARTRATE 10 MG PO TABS: 10.0000 mg | ORAL_TABLET | Freq: Every day | ORAL | Status: DC

## 2011-11-14 NOTE — Telephone Encounter (Signed)
Pt calling again, would like MD to be paged, says the pharmacy faxed [place] last Monday then again on 12/14, pt needs meds today.

## 2011-11-14 NOTE — Telephone Encounter (Signed)
Pt checking status of rx for ambien and diazepam, is completely out, called into his pharmacy on 12/14, would like someone to call him back today to give him status.

## 2011-11-14 NOTE — Telephone Encounter (Signed)
Called in prescriptions. Pharmacy to notify patient when refills ready.

## 2011-11-14 NOTE — Telephone Encounter (Signed)
Will forward to Dr Orton 

## 2011-12-16 ENCOUNTER — Other Ambulatory Visit: Payer: Self-pay | Admitting: Family Medicine

## 2011-12-17 NOTE — Telephone Encounter (Signed)
Refill request

## 2011-12-28 ENCOUNTER — Ambulatory Visit (INDEPENDENT_AMBULATORY_CARE_PROVIDER_SITE_OTHER): Payer: BC Managed Care – PPO | Admitting: Family Medicine

## 2011-12-28 ENCOUNTER — Encounter: Payer: Self-pay | Admitting: Internal Medicine

## 2011-12-28 ENCOUNTER — Telehealth: Payer: Self-pay | Admitting: *Deleted

## 2011-12-28 ENCOUNTER — Encounter: Payer: Self-pay | Admitting: Family Medicine

## 2011-12-28 VITALS — BP 120/72 | HR 76 | Temp 97.8°F | Ht 70.0 in | Wt 194.2 lb

## 2011-12-28 DIAGNOSIS — G5600 Carpal tunnel syndrome, unspecified upper limb: Secondary | ICD-10-CM

## 2011-12-28 DIAGNOSIS — I10 Essential (primary) hypertension: Secondary | ICD-10-CM

## 2011-12-28 MED ORDER — ADJUSTABLE WRIST BRACE MISC: 1.0000 | Freq: Once | Status: DC

## 2011-12-28 NOTE — Assessment & Plan Note (Signed)
Referral to neurology for nerve testing Right wrist brace to be worn at night when sleeping. Advised to sleep on other side and support arm with pillows to avoid compression.

## 2011-12-28 NOTE — Assessment & Plan Note (Signed)
Doing well on HCTZ. 120/80 on recheck manually by me.

## 2011-12-28 NOTE — Progress Notes (Signed)
  Subjective:    Patient ID: Jason Williams, male    DOB: September 26, 1961, 51 y.o.   MRN: [id number]  HPI 1. Bilateral finger tingling/right hand numbness and tingling. Patient wakes up from sleep (sleeps on right side) with right arm "dead" takes several minutes for his arm to come back. He experiences tingling and numbness in his right hand worse than left. He does not do repetat ive movements with his wrists at work.  He describes the numbness distribution as 1st through 3 on the palmar surface. No neck injury. No neck pain.   Review of Systems Pertinent items are noted in HPI. No fever, chills, night sweats, weight loss.     Objective:   Physical Exam Filed Vitals:   12/28/11 1457  BP: 120/72  Pulse: 76  Temp: 97.8 F (36.6 C)  TempSrc: Oral  Height: 5\' 10"  (1.778 m)  Weight: 194 lb 3.2 oz (88.089 kg)  Lungs:  Normal respiratory effort, chest expands symmetrically. Lungs are clear to auscultation, no crackles or wheezes.  Right wrist: positive tinels and phallen's testing.  reflexes all normal and equal, upper and lower ext. Negative spurling's test.     Assessment & Plan:

## 2011-12-28 NOTE — Patient Instructions (Signed)
It was great to see you today!  Schedule an appointment to see me as needed.   I will prescribe you a brace for your right wrist.  I will refer you to neurology to have you evaluated for carpal tunnel syndrome.

## 2011-12-28 NOTE — Telephone Encounter (Signed)
information of appointment was given. 2/5 3:30 pm for nurse visit at [facility]. Procedure is 2/19 at 1pm. [facility] [address] 3rd floor [phone]

## 2012-01-02 ENCOUNTER — Encounter: Payer: Self-pay | Admitting: Internal Medicine

## 2012-01-02 ENCOUNTER — Ambulatory Visit (AMBULATORY_SURGERY_CENTER): Payer: BC Managed Care – PPO | Admitting: *Deleted

## 2012-01-02 VITALS — Ht 70.0 in | Wt 195.4 lb

## 2012-01-02 DIAGNOSIS — Z1211 Encounter for screening for malignant neoplasm of colon: Secondary | ICD-10-CM

## 2012-01-02 MED ORDER — MOVIPREP 100 G PO SOLR: ORAL | Status: DC

## 2012-01-08 ENCOUNTER — Emergency Department (INDEPENDENT_AMBULATORY_CARE_PROVIDER_SITE_OTHER): Payer: Worker's Compensation

## 2012-01-08 ENCOUNTER — Emergency Department (HOSPITAL_BASED_OUTPATIENT_CLINIC_OR_DEPARTMENT_OTHER)
Admission: EM | Admit: 2012-01-08 | Discharge: 2012-01-08 | Disposition: A | Discharge status: Home / Self Care | Payer: Worker's Compensation | Attending: Emergency Medicine | Admitting: Emergency Medicine

## 2012-01-08 ENCOUNTER — Encounter (HOSPITAL_BASED_OUTPATIENT_CLINIC_OR_DEPARTMENT_OTHER): Payer: Self-pay | Admitting: *Deleted

## 2012-01-08 DIAGNOSIS — K219 Gastro-esophageal reflux disease without esophagitis: Secondary | ICD-10-CM | POA: Insufficient documentation

## 2012-01-08 DIAGNOSIS — I10 Essential (primary) hypertension: Secondary | ICD-10-CM | POA: Insufficient documentation

## 2012-01-08 DIAGNOSIS — IMO0002 Reserved for concepts with insufficient information to code with codable children: Secondary | ICD-10-CM

## 2012-01-08 DIAGNOSIS — Z79899 Other long term (current) drug therapy: Secondary | ICD-10-CM | POA: Insufficient documentation

## 2012-01-08 DIAGNOSIS — X500XXA Overexertion from strenuous movement or load, initial encounter: Secondary | ICD-10-CM | POA: Insufficient documentation

## 2012-01-08 DIAGNOSIS — M549 Dorsalgia, unspecified: Secondary | ICD-10-CM

## 2012-01-08 DIAGNOSIS — M545 Low back pain: Secondary | ICD-10-CM

## 2012-01-08 DIAGNOSIS — E785 Hyperlipidemia, unspecified: Secondary | ICD-10-CM | POA: Insufficient documentation

## 2012-01-08 DIAGNOSIS — F341 Dysthymic disorder: Secondary | ICD-10-CM | POA: Insufficient documentation

## 2012-01-08 MED ORDER — OXYCODONE-ACETAMINOPHEN 5-325 MG PO TABS: 1.0000 | ORAL_TABLET | Freq: Four times a day (QID) | ORAL | Status: AC | PRN

## 2012-01-08 MED ORDER — CYCLOBENZAPRINE HCL 10 MG PO TABS: 10.0000 mg | ORAL_TABLET | Freq: Two times a day (BID) | ORAL | Status: AC | PRN

## 2012-01-08 NOTE — ED Provider Notes (Signed)
History     CSN: [id number]  Arrival date & time 01/08/12  1456   First MD Initiated Contact with Patient 01/08/12 1533      Chief Complaint  Patient presents with  . Back Injury    (Consider location/radiation/quality/duration/timing/severity/associated sxs/prior treatment) HPI  Patient presents to the ED with complaints of back injury. On Friday he was lifting something heavy into a truck when he injured his back. He denies hearing a pop. Since then, his back has been gradually getting worse. Today he states he is having a hard time bending over and is walking slower. He denies urinary or bowel symptoms, extremity weakness or loss of sensations.  Past Medical History  Diagnosis Date  . Anxiety   . Depression   . Depression   . GERD (gastroesophageal reflux disease)   . Hyperlipidemia   . Hypertension     Past Surgical History  Procedure Date  . Appendectomy 1981  . Ankle re construction 2011    History reviewed. No pertinent family history.  History  Substance Use Topics  . Smoking status: Never Smoker   . Smokeless tobacco: Never Used  . Alcohol Use: 0.6 oz/week    1 Glasses of wine per week      Review of Systems  All other systems reviewed and are negative.    Allergies  Review of patient's allergies indicates no known allergies.  Home Medications   Current Outpatient Rx  Name Route Sig Dispense Refill  . BUPROPION HCL ER (XL) 300 MG PO TB24  TAKE 1 TABLET BY MOUTH IN THE MORNING 90 tablet 0  . [facility] ASPIRIN CHILD 81 MG PO CHEW  TAKE 1 PILL DAILY TO THIN BLOOD TO HELP TO PREVENT HEART ATTACK AND STROKE 90 tablet 3  . DIAZEPAM 10 MG PO TABS Oral Take 1 tablet (10 mg total) by mouth every 8 (eight) hours as needed for anxiety or sleep. 270 tablet 0  . ADJUSTABLE WRIST BRACE MISC Does not apply 1 Device by Does not apply route once. 1 each 0  . ESOMEPRAZOLE MAGNESIUM 20 MG PO CPDR Oral Take 20 mg by mouth daily as needed. For acid reflux. Take 1 hour  prior to first meal of the day.    Marland Kitchen HYDROCHLOROTHIAZIDE 12.5 MG PO TABS Oral Take 1 tablet (12.5 mg total) by mouth daily. 90 tablet 11  . ROSUVASTATIN CALCIUM 20 MG PO TABS Oral Take 1 tablet (20 mg total) by mouth at bedtime. 90 tablet 2  . TRAMADOL HCL 50 MG PO TABS  TAKE 1 TABLET BY MOUTH EVERY 6 HOURS AS NEEDED FOR PAIN 180 tablet 0  . ZOLPIDEM TARTRATE 10 MG PO TABS Oral Take 1 tablet (10 mg total) by mouth at bedtime. 90 tablet 0  . CYCLOBENZAPRINE HCL 10 MG PO TABS Oral Take 1 tablet (10 mg total) by mouth 2 (two) times daily as needed for muscle spasms. 20 tablet 0  . OXYCODONE-ACETAMINOPHEN 5-325 MG PO TABS Oral Take 1 tablet by mouth every 6 (six) hours as needed for pain. 15 tablet 0    BP 135/87  Pulse 84  Temp(Src) 97.8 F (36.6 C) (Oral)  Resp 18  Ht 5\' 10"  (1.778 m)  Wt 190 lb (86.183 kg)  BMI 27.26 kg/m2  SpO2 98%  Physical Exam  Nursing note and vitals reviewed. Constitutional: He appears well-developed and well-nourished.  HENT:  Head: Normocephalic and atraumatic.  Eyes: Pupils are equal, round, and reactive to light.  Neck: Normal range  of motion.  Cardiovascular: Normal rate and regular rhythm.   Pulmonary/Chest: Effort normal and breath sounds normal.  Musculoskeletal:       Lumbar back: He exhibits decreased range of motion, tenderness, bony tenderness and spasm. He exhibits no swelling, no edema, no deformity, no laceration, no pain and normal pulse.       Back:  Neurological: He is alert.  Skin: Skin is warm and dry.    ED Course  Procedures (including critical care time)  Labs Reviewed - No data to display No results found.   1. Back pain       MDM      Patient Complaints #1 low back pain   Plan  Anti inflammatories, muscle relaxers, moist heat, gentle exercises.   Referrals [facility]  Prescriptions #1 flexiril  #2 percocets   Continue these medications as noted  Acxel, Dingee  Home Medication Instructions  OZH:[id number]   Printed on:01/08/12 1602  Medication Information                    buPROPion (WELLBUTRIN XL) 300 MG 24 hr tablet TAKE 1 TABLET BY MOUTH IN THE MORNING           [facility] ASPIRIN CHILD 81 MG chewable tablet TAKE 1 PILL DAILY TO THIN BLOOD TO HELP TO PREVENT HEART ATTACK AND STROKE           diazepam (VALIUM) 10 MG tablet Take 1 tablet (10 mg total) by mouth every 8 (eight) hours as needed for anxiety or sleep.           Elastic Bandages & Supports (ADJUSTABLE WRIST BRACE) MISC 1 Device by Does not apply route once.           esomeprazole (NEXIUM) 20 MG capsule Take 20 mg by mouth daily as needed. For acid reflux. Take 1 hour prior to first meal of the day.           hydrochlorothiazide (HYDRODIURIL) 12.5 MG tablet Take 1 tablet (12.5 mg total) by mouth daily.           rosuvastatin (CRESTOR) 20 MG tablet Take 1 tablet (20 mg total) by mouth at bedtime.           traMADol (ULTRAM) 50 MG tablet TAKE 1 TABLET BY MOUTH EVERY 6 HOURS AS NEEDED FOR PAIN           zolpidem (AMBIEN) 10 MG tablet Take 1 tablet (10 mg total) by mouth at bedtime.           cyclobenzaprine (FLEXERIL) 10 MG tablet Take 1 tablet (10 mg total) by mouth 2 (two) times daily as needed for muscle spasms.           oxyCODONE-acetaminophen (PERCOCET) 5-325 MG per tablet Take 1 tablet by mouth every 6 (six) hours as needed for pain.             Pt has been given return to ER precautions. Pt is agreeable with plan and will return to the ED as needed for any changing or worsening of symptoms.        Dorthula Matas, PA 01/08/12 1605

## 2012-01-08 NOTE — ED Notes (Signed)
Pt was lifting a chiller into is vehicle unable to do so ended up hurting his low back on Friday around noon pain mostly localized to lower mid back does have a small amount of radiation into buttock area

## 2012-01-09 ENCOUNTER — Other Ambulatory Visit: Payer: Self-pay | Admitting: Family Medicine

## 2012-01-09 MED ORDER — DICLOFENAC SODIUM 75 MG PO TBEC: 75.0000 mg | DELAYED_RELEASE_TABLET | Freq: Two times a day (BID) | ORAL | Status: DC

## 2012-01-09 NOTE — Telephone Encounter (Signed)
Patient is calling after being seen in ER for Muscle strain in his back.  Hi employer suggested an anti-inflammatory called Voltarin (Diclofenac) because the Muscle Relaxers make him too sleepy to be able to continue to work.

## 2012-01-09 NOTE — Telephone Encounter (Signed)
Spoke with patient and informed him that rx was sent to pharmacy

## 2012-01-09 NOTE — Telephone Encounter (Signed)
I sent him in volteran. Thanks sara

## 2012-01-09 NOTE — Telephone Encounter (Signed)
Jason Williams, This patient was seen in ED 2/11. He was prescribed percocet and flexeril. Would you like this patient to come in before prescribing this or will something be prescribed. -----Huntley Dec

## 2012-01-10 NOTE — ED Provider Notes (Signed)
Medical screening examination/treatment/procedure(s) were performed by non-physician practitioner and as supervising physician I was immediately available for consultation/collaboration.  Shalise Rosado R. Hailly Fess, MD 01/10/12 1450 

## 2012-01-16 ENCOUNTER — Other Ambulatory Visit: Payer: BC Managed Care – PPO | Admitting: Internal Medicine

## 2012-01-18 ENCOUNTER — Encounter: Payer: Self-pay | Admitting: Internal Medicine

## 2012-01-18 ENCOUNTER — Encounter: Payer: BC Managed Care – PPO | Admitting: Internal Medicine

## 2012-01-18 ENCOUNTER — Ambulatory Visit (AMBULATORY_SURGERY_CENTER): Payer: BC Managed Care – PPO | Admitting: Internal Medicine

## 2012-01-18 DIAGNOSIS — Z1211 Encounter for screening for malignant neoplasm of colon: Secondary | ICD-10-CM

## 2012-01-18 DIAGNOSIS — D126 Benign neoplasm of colon, unspecified: Secondary | ICD-10-CM

## 2012-01-18 DIAGNOSIS — K635 Polyp of colon: Secondary | ICD-10-CM

## 2012-01-18 MED ORDER — SODIUM CHLORIDE 0.9 % IV SOLN: 500.0000 mL | INTRAVENOUS | Status: DC

## 2012-01-18 NOTE — Progress Notes (Signed)
Patient did not experience any of the following events: a burn prior to discharge; a fall within the facility; wrong site/side/patient/procedure/implant event; or a hospital transfer or hospital admission upon discharge from the facility. (G8907) Patient did not have preoperative order for IV antibiotic SSI prophylaxis. (G8918)  

## 2012-01-18 NOTE — Patient Instructions (Signed)
Handout on polyps given. Resume previous medications.YOU HAD AN ENDOSCOPIC PROCEDURE TODAY AT THE [facility]: Refer to the procedure report that was given to you for any specific questions about what was found during the examination.  If the procedure report does not answer your questions, please call your gastroenterologist to clarify.  If you requested that your care partner not be given the details of your procedure findings, then the procedure report has been included in a sealed envelope for you to review at your convenience later.  YOU SHOULD EXPECT: Some feelings of bloating in the abdomen. Passage of more gas than usual.  Walking can help get rid of the air that was put into your GI tract during the procedure and reduce the bloating. If you had a lower endoscopy (such as a colonoscopy or flexible sigmoidoscopy) you may notice spotting of blood in your stool or on the toilet paper. If you underwent a bowel prep for your procedure, then you may not have a normal bowel movement for a few days.  DIET: Your first meal following the procedure should be a light meal and then it is ok to progress to your normal diet.  A half-sandwich or bowl of soup is an example of a good first meal.  Heavy or fried foods are harder to digest and may make you feel nauseous or bloated.  Likewise meals heavy in dairy and vegetables can cause extra gas to form and this can also increase the bloating.  Drink plenty of fluids but you should avoid alcoholic beverages for 24 hours.  ACTIVITY: Your care partner should take you home directly after the procedure.  You should plan to take it easy, moving slowly for the rest of the day.  You can resume normal activity the day after the procedure however you should NOT DRIVE or use heavy machinery for 24 hours (because of the sedation medicines used during the test).    SYMPTOMS TO REPORT IMMEDIATELY: A gastroenterologist can be reached at any hour.  During normal  business hours, 8:30 AM to 5:00 PM Monday through Friday, call [phone].  After hours and on weekends, please call the GI answering service at [phone] who will take a message and have the physician on call contact you.   Following lower endoscopy (colonoscopy or flexible sigmoidoscopy):  Excessive amounts of blood in the stool  Significant tenderness or worsening of abdominal pains  Swelling of the abdomen that is new, acute  Fever of 100F or higher  FOLLOW UP: If any biopsies were taken you will be contacted by phone or by letter within the next 1-3 weeks.  Call your gastroenterologist if you have not heard about the biopsies in 3 weeks.  Our staff will call the home number listed on your records the next business day following your procedure to check on you and address any questions or concerns that you may have at that time regarding the information given to you following your procedure. This is a courtesy call and so if there is no answer at the home number and we have not heard from you through the emergency physician on call, we will assume that you have returned to your regular daily activities without incident.  SIGNATURES/CONFIDENTIALITY: You and/or your care partner have signed paperwork which will be entered into your electronic medical record.  These signatures attest to the fact that that the information above on your After Visit Summary has been reviewed and is understood.  Full  responsibility of the confidentiality of this discharge information lies with you and/or your care-partner.  

## 2012-01-18 NOTE — Op Note (Signed)
[facility] [address] [place], [place]  [address]  COLONOSCOPY PROCEDURE REPORT  PATIENT:  Dempsey, Ahonen  MR#:  [id number] BIRTHDATE:  1961/07/16, 50 yrs. old  GENDER:  male ENDOSCOPIST:  Carie Caddy. Gorman Safi, MD REF. BY:  Edd Arbour, M.D. PROCEDURE DATE:  01/18/2012 PROCEDURE:  Colonoscopy with snare polypectomy ASA CLASS:  Class I INDICATIONS:  Routine Risk Screening, 1st colonoscopy MEDICATIONS:   MAC sedation, administered by CRNA, propofol (Diprivan) 200 mg IV  DESCRIPTION OF PROCEDURE:   After the risks benefits and alternatives of the procedure were thoroughly explained, informed consent was obtained.  Digital rectal exam was performed and revealed no rectal masses.   The LB CF-[id number] endoscope was introduced through the anus and advanced to the terminal ileum which was intubated for a short distance, without limitations. The quality of the prep was good, using MoviPrep.  The instrument was then slowly withdrawn as the colon was fully examined. <<PROCEDUREIMAGES>> FINDINGS:  A 3 mm sessile polyp was found in the ascending colon. Polyp was snared without cautery. Retrieval was successful.  This was otherwise a normal examination of the colon.   Retroflexed views in the rectum revealed no abnormalities. The scope was then withdrawn from the cecum and the procedure completed.  COMPLICATIONS:  None+  ENDOSCOPIC IMPRESSION: 1) Sessile polyp in the ascending colon.  Removed and sent to pathology. 2) Otherwise normal examination  RECOMMENDATIONS: 1) Await pathology results 2) If the polyp removed today is proven to be an adenomatous (pre-cancerous) polyp, you will need a repeat colonoscopy in 5 years. Otherwise you should continue to follow colorectal cancer screening guidelines for "routine risk" patients with colonoscopy in 10 years. You will receive a letter within 1-2 weeks with the results of your biopsy as well as final recommendations.  Please call my office if you have not received a letter after 3 weeks. 3) You will receive a letter within 1-2 weeks with the results of your biopsy as well as final recommendations. Please call my office if you have not received a letter after 3 weeks.  Carie Caddy. Rhea Belton, MD  CC:  Edd Arbour MD The Patient  n. eSIGNEDCarie Caddy. Manraj Yeo at 01/18/2012 11:52 AM  Cleatis Polka, [phone]

## 2012-01-19 ENCOUNTER — Telehealth: Payer: Self-pay | Admitting: *Deleted

## 2012-01-19 NOTE — Telephone Encounter (Signed)
  Follow up Call-  Call back number 01/18/2012  Post procedure Call Back phone  # [phone]  Permission to leave phone message Yes     [facility]

## 2012-01-26 ENCOUNTER — Encounter: Payer: Self-pay | Admitting: Internal Medicine

## 2012-01-29 ENCOUNTER — Ambulatory Visit: Payer: Self-pay | Admitting: Family Medicine

## 2012-03-24 ENCOUNTER — Other Ambulatory Visit: Payer: Self-pay | Admitting: Family Medicine

## 2012-04-04 ENCOUNTER — Other Ambulatory Visit: Payer: Self-pay | Admitting: Family Medicine

## 2012-04-04 DIAGNOSIS — I10 Essential (primary) hypertension: Secondary | ICD-10-CM

## 2012-04-04 MED ORDER — HYDROCHLOROTHIAZIDE 12.5 MG PO TABS: 12.5000 mg | ORAL_TABLET | Freq: Every day | ORAL | Status: DC

## 2012-04-05 ENCOUNTER — Other Ambulatory Visit: Payer: Self-pay | Admitting: *Deleted

## 2012-04-05 DIAGNOSIS — I10 Essential (primary) hypertension: Secondary | ICD-10-CM

## 2012-04-05 MED ORDER — HYDROCHLOROTHIAZIDE 12.5 MG PO TABS: 12.5000 mg | ORAL_TABLET | Freq: Every day | ORAL | Status: DC

## 2012-04-05 NOTE — Telephone Encounter (Signed)
Pharmacy calling to request refill of HCTZ 12.5mg  #90---one tablet daily.  Last filled on 01/08/12.  Will route request to Dr. Rivka Safer.  Gaylene Brooks, RN

## 2012-04-12 ENCOUNTER — Other Ambulatory Visit: Payer: Self-pay | Admitting: Family Medicine

## 2012-04-12 ENCOUNTER — Telehealth: Payer: Self-pay | Admitting: Family Medicine

## 2012-04-12 DIAGNOSIS — G4700 Insomnia, unspecified: Secondary | ICD-10-CM

## 2012-04-12 DIAGNOSIS — I10 Essential (primary) hypertension: Secondary | ICD-10-CM

## 2012-04-12 MED ORDER — ZOLPIDEM TARTRATE 10 MG PO TABS: 10.0000 mg | ORAL_TABLET | Freq: Every day | ORAL | Status: DC

## 2012-04-12 MED ORDER — HYDROCHLOROTHIAZIDE 12.5 MG PO TABS: 12.5000 mg | ORAL_TABLET | Freq: Every day | ORAL | Status: DC

## 2012-04-12 NOTE — Telephone Encounter (Signed)
Refilled Jason Williams, [facility]

## 2012-04-12 NOTE — Telephone Encounter (Signed)
Pharmacy has told him that they have sent several requests for his Ambien, but in fact they sent request for HCTZ instead (which had been taken care of)  Wants to know if his Ambien can be sent in today.  He is out.  [facility] [address]

## 2012-04-24 ENCOUNTER — Ambulatory Visit: Payer: Self-pay | Admitting: Family Medicine

## 2012-04-26 ENCOUNTER — Ambulatory Visit: Payer: Self-pay | Admitting: Emergency Medicine

## 2012-05-06 ENCOUNTER — Ambulatory Visit (INDEPENDENT_AMBULATORY_CARE_PROVIDER_SITE_OTHER): Payer: BC Managed Care – PPO | Admitting: Family Medicine

## 2012-05-06 ENCOUNTER — Encounter: Payer: Self-pay | Admitting: Family Medicine

## 2012-05-06 VITALS — BP 136/82 | HR 76 | Temp 98.3°F | Ht 70.0 in | Wt 197.3 lb

## 2012-05-06 DIAGNOSIS — F411 Generalized anxiety disorder: Secondary | ICD-10-CM

## 2012-05-06 DIAGNOSIS — I10 Essential (primary) hypertension: Secondary | ICD-10-CM

## 2012-05-06 DIAGNOSIS — E785 Hyperlipidemia, unspecified: Secondary | ICD-10-CM

## 2012-05-06 DIAGNOSIS — G4700 Insomnia, unspecified: Secondary | ICD-10-CM

## 2012-05-06 DIAGNOSIS — M25579 Pain in unspecified ankle and joints of unspecified foot: Secondary | ICD-10-CM

## 2012-05-06 MED ORDER — DIAZEPAM 10 MG PO TABS: 10.0000 mg | ORAL_TABLET | Freq: Three times a day (TID) | ORAL | Status: DC | PRN

## 2012-05-06 MED ORDER — ROSUVASTATIN CALCIUM 20 MG PO TABS: 20.0000 mg | ORAL_TABLET | Freq: Every day | ORAL | Status: DC

## 2012-05-06 MED ORDER — ZOLPIDEM TARTRATE 10 MG PO TABS: 10.0000 mg | ORAL_TABLET | Freq: Every evening | ORAL | Status: DC | PRN

## 2012-05-06 MED ORDER — TRAMADOL HCL 50 MG PO TABS: 50.0000 mg | ORAL_TABLET | Freq: Three times a day (TID) | ORAL | Status: DC | PRN

## 2012-05-06 MED ORDER — HYDROCHLOROTHIAZIDE 12.5 MG PO TABS: 12.5000 mg | ORAL_TABLET | Freq: Every day | ORAL | Status: DC

## 2012-05-06 NOTE — Assessment & Plan Note (Signed)
Patient has bad anxiety. The diazepam was started by Dr. Clotilde Dieter. It has slowly increased to 10 mg TID.  I would not increase this dosage further.  I would consider clonipin long acting.

## 2012-05-06 NOTE — Progress Notes (Signed)
  Subjective:    Patient ID: Jason Williams, male    DOB: 01-07-1961, 51 y.o.   MRN: [id number]  HPI 1. Anxiety Patient has suffered from anxiety for a long time. He takes Valium 10 mg TID prn with good results. He has not had any panic attacks. He has a strong family history of mental disease. He doesn't have any history of substance abuse. He drinks two glasses of wine a week. He stopped taking the Wellbutrin because it made him hear sounds at night.    2. Insomnia Good sleep hygiene. Does look at blue light (lap top, cell phone) before sleep. Does drink caffeine at dinner. Taking ambien with success.   Review of Systems  Constitutional: Negative for fever, fatigue and unexpected weight change.  Gastrointestinal: Negative for abdominal pain and blood in stool.  Neurological: Negative for dizziness and headaches.  Psychiatric/Behavioral: Positive for sleep disturbance. Negative for suicidal ideas, hallucinations, behavioral problems and agitation. The patient is nervous/anxious.        Objective:   Physical Exam  Nursing note and vitals reviewed. Constitutional: He appears well-developed and well-nourished. No distress.  Cardiovascular: Normal rate, regular rhythm and normal heart sounds.   No murmur heard. Pulmonary/Chest: Effort normal and breath sounds normal. No respiratory distress. He has no wheezes.  Skin: He is not diaphoretic.  Psychiatric: He has a normal mood and affect. His behavior is normal. Judgment and thought content normal.      Assessment & Plan:

## 2012-05-06 NOTE — Patient Instructions (Addendum)
Thank you for coming in today.  We discussed your anxiety and insomnia.  I have refilled

## 2012-05-06 NOTE — Progress Notes (Signed)
Addended by: Edd Arbour on: 05/06/2012 04:54 PM   Modules accepted: Orders

## 2012-07-22 ENCOUNTER — Other Ambulatory Visit: Payer: Self-pay | Admitting: Family Medicine

## 2012-08-21 ENCOUNTER — Other Ambulatory Visit: Payer: Self-pay | Admitting: *Deleted

## 2012-08-27 ENCOUNTER — Encounter: Payer: Self-pay | Admitting: Family Medicine

## 2012-08-27 ENCOUNTER — Ambulatory Visit (INDEPENDENT_AMBULATORY_CARE_PROVIDER_SITE_OTHER): Payer: BC Managed Care – PPO | Admitting: Family Medicine

## 2012-08-27 VITALS — BP 147/97 | HR 87 | Temp 98.4°F | Ht 70.0 in | Wt 198.6 lb

## 2012-08-27 DIAGNOSIS — M25579 Pain in unspecified ankle and joints of unspecified foot: Secondary | ICD-10-CM

## 2012-08-27 DIAGNOSIS — R6889 Other general symptoms and signs: Secondary | ICD-10-CM | POA: Insufficient documentation

## 2012-08-27 DIAGNOSIS — Z125 Encounter for screening for malignant neoplasm of prostate: Secondary | ICD-10-CM

## 2012-08-27 DIAGNOSIS — R0989 Other specified symptoms and signs involving the circulatory and respiratory systems: Secondary | ICD-10-CM

## 2012-08-27 DIAGNOSIS — F39 Unspecified mood [affective] disorder: Secondary | ICD-10-CM

## 2012-08-27 DIAGNOSIS — E785 Hyperlipidemia, unspecified: Secondary | ICD-10-CM

## 2012-08-27 DIAGNOSIS — G4700 Insomnia, unspecified: Secondary | ICD-10-CM

## 2012-08-27 DIAGNOSIS — I10 Essential (primary) hypertension: Secondary | ICD-10-CM

## 2012-08-27 LAB — COMPREHENSIVE METABOLIC PANEL
ALT: 29 U/L (ref 0–53)
BUN: 19 mg/dL (ref 6–23)
CO2: 25 mEq/L (ref 19–32)
Calcium: 10 mg/dL (ref 8.4–10.5)
Chloride: 102 mEq/L (ref 96–112)
Creat: 0.99 mg/dL (ref 0.50–1.35)
Glucose, Bld: 92 mg/dL (ref 70–99)
Total Bilirubin: 0.6 mg/dL (ref 0.3–1.2)

## 2012-08-27 LAB — LIPID PANEL
Cholesterol: 165 mg/dL (ref 0–200)
HDL: 50 mg/dL (ref >39)
Total CHOL/HDL Ratio: 3.3 Ratio
Triglycerides: 82 mg/dL (ref <150)

## 2012-08-27 MED ORDER — HYDROCHLOROTHIAZIDE 25 MG PO TABS: 25.0000 mg | ORAL_TABLET | Freq: Every day | ORAL | Status: DC

## 2012-08-27 MED ORDER — ROSUVASTATIN CALCIUM 20 MG PO TABS: 20.0000 mg | ORAL_TABLET | Freq: Every day | ORAL | Status: DC

## 2012-08-27 MED ORDER — ZOLPIDEM TARTRATE 10 MG PO TABS: 10.0000 mg | ORAL_TABLET | Freq: Every evening | ORAL | Status: DC | PRN

## 2012-08-27 MED ORDER — DIAZEPAM 10 MG PO TABS: 10.0000 mg | ORAL_TABLET | Freq: Three times a day (TID) | ORAL | Status: DC | PRN

## 2012-08-27 MED ORDER — TRAMADOL HCL 50 MG PO TABS: 50.0000 mg | ORAL_TABLET | Freq: Three times a day (TID) | ORAL | Status: DC | PRN

## 2012-08-27 NOTE — Assessment & Plan Note (Signed)
Has failed trial of PPI and antihistamines, will refer to ENT.

## 2012-08-27 NOTE — Progress Notes (Signed)
  Subjective:    Patient ID: Jason Williams, male    DOB: [birth year], 51 y.o.   MRN: [id number]  HPI  HYPERTENSION  BP Readings from Last 3 Encounters:  08/27/12 147/97  05/06/12 136/82  01/08/12 135/87    Hypertension ROS: taking medications as instructed, no medication side effects noted, no chest pain on exertion, no dyspnea on exertion and no swelling of ankles.   HYPERLIPIDEMIA  Diet: Not following low cholesterol diet Exercise: No regular exercise Wt Readings from Last 3 Encounters:  08/27/12 198 lb 9.6 oz (90.084 kg)  05/06/12 197 lb 4.8 oz (89.495 kg)  01/18/12 195 lb (88.451 kg)   ROS:  Denies RUQ pain, myalgias, or symptoms or coronary ischemia Lab Results  Component Value Date   LDLCALC 109* 08/15/2011   Lab Results  Component Value Date   CHOL 187 08/15/2011   CHOL 245* 11/01/2010   CHOL 198 08/16/2009   Lab Results  Component Value Date   HDL 41 08/15/2011   HDL 46 40/07/8118   HDL 47 1/47/8295   Lab Results  Component Value Date   TRIG 187* 08/15/2011   TRIG 228* 11/01/2010   TRIG 144 08/16/2009   Lab Results  Component Value Date   ALT 31 11/01/2010   AST 20 11/01/2010   ALKPHOS 53 11/01/2010   BILITOT 0.6 11/01/2010    Ankle Pain:  Right ankle reconstruction, 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 Eulah Pont for this and recovered cortisone injections.  Also with plantar fascitis- needs tramadol 3 times a day.  Has had professional insoles made in past with no improvement.  Is contemplating other ankle surgery with Dr. Eulah Pont.  Anxiety Depression:  Routine follow-up at baseline.  Reports having been tried on multiple agents in the past, has been seen in [facility] with poor rapport, and has settled on regimen of valium BID-TID.  Reports it helping with controlling emotional responses at work.  Reports family history of father with severe mental illness, attributes medicine to part of his death, cautious about further changes in regimen  Cough and  hoarseness for 20 years:  Feels constant need to clear throat, has persisted despite trial of decongestant, allergy medication and PPI.         Review of Systems See hPI    Objective:   Physical Exam  GEN: Alert & Oriented, No acute distress HEENT: throat with posterior irritation. CV:  Regular Rate & Rhythm, no murmur Respiratory:  Normal work of breathing, CTAB Abd:  + BS, soft, no tenderness to palpation Ext: no pre-tibial edema       Assessment & Plan:

## 2012-08-27 NOTE — Assessment & Plan Note (Signed)
Chronic pain.  Will refill tramadol today.  Encouraged him to continue to see Dr. Eulah Pont as needed for evaluation.  Has had injections, orthotics already.

## 2012-08-27 NOTE — Assessment & Plan Note (Signed)
Discussed at length my concern that continued reliance on ambien and valium can be a crutch and we also discussed the risks of accidental overdose, sedation, from benzos.  At today;s first meeting, I get the sense that he may benefit from improved management of his mood disorder and would help to reduce reliance of valium and improve quality of life.  He is open to seeing a psychiatrist for another opinion.  I gave him a few numbers to try.    Will refill valium for 3 months, will follow-up with PCP before next refill

## 2012-08-27 NOTE — Assessment & Plan Note (Signed)
Was previously well controlled.  Due for annual recheck

## 2012-08-27 NOTE — Assessment & Plan Note (Signed)
Will increase hCTZ to 25 mg

## 2012-08-27 NOTE — Patient Instructions (Addendum)
I recommend getting an evaluation with a psychiatrist to discuss optimization of your medication regimen  Follow-up in 3 months with your primary doctor

## 2012-08-28 ENCOUNTER — Encounter: Payer: Self-pay | Admitting: Family Medicine

## 2012-10-09 ENCOUNTER — Telehealth: Payer: Self-pay | Admitting: Family Medicine

## 2012-10-09 ENCOUNTER — Ambulatory Visit: Payer: Self-pay | Admitting: Family Medicine

## 2012-10-09 NOTE — Telephone Encounter (Signed)
Patient reports he removed a tiick  6 days ago . He is sure tick had been there less than 24 hours. It was a small tick size of pinhead  probably  a [place]er Tick . Area swelled to size of pea , now has gone down  In size slightly. Itches a little and is not sore to touch. There is a black center, just a dot. Consulted with Dr. Earnest Bailey.  She advises just to watch and if he develops fever, body aches, rashes or signs of infection at site to come in . Otherwise OK to just monitor for now.

## 2012-10-09 NOTE — Telephone Encounter (Signed)
Patient called to schedule an appt yesterday for today, which he has now cancelled.  He was coming in for an irritated area from a tick bite.  He would like to speak to the nurse about how he can treat this and when he will need to see a doctor.

## 2012-10-22 ENCOUNTER — Telehealth: Payer: Self-pay | Admitting: Family Medicine

## 2012-10-22 MED ORDER — ATORVASTATIN CALCIUM 40 MG PO TABS: 40.0000 mg | ORAL_TABLET | Freq: Every day | ORAL | Status: DC

## 2012-10-22 NOTE — Telephone Encounter (Signed)
Pt was going to refill his Crestor and found out that it is very expensive and that there is no generic for it.  Pharmacist told him that Lipitor was very close to this and that there is a generic for that one.  Is asking to change to generic [facility] [address] pls let pt know

## 2012-11-14 ENCOUNTER — Other Ambulatory Visit: Payer: Self-pay | Admitting: *Deleted

## 2012-11-14 DIAGNOSIS — G4700 Insomnia, unspecified: Secondary | ICD-10-CM

## 2012-11-18 ENCOUNTER — Telehealth: Payer: Self-pay | Admitting: Family Medicine

## 2012-11-18 NOTE — Telephone Encounter (Signed)
Patient isn't sure if with his insurance, he will need a referral to [facility] for problems with his left ankle.  He had the same problem with the right.  The appt that he has is set for 10:30 this Friday.

## 2012-11-18 NOTE — Telephone Encounter (Signed)
Called and told pt to inform them of this at time of appt. And should they tell him that he should need a referral for this to call us and we will place a referral for it .Loralee Pacas Campbell

## 2013-01-07 ENCOUNTER — Encounter: Payer: Self-pay | Admitting: Family Medicine

## 2013-01-07 ENCOUNTER — Ambulatory Visit (INDEPENDENT_AMBULATORY_CARE_PROVIDER_SITE_OTHER): Payer: BC Managed Care – PPO | Admitting: Family Medicine

## 2013-01-07 VITALS — BP 129/90 | HR 67 | Temp 98.8°F | Ht 70.0 in | Wt 201.0 lb

## 2013-01-07 DIAGNOSIS — G4700 Insomnia, unspecified: Secondary | ICD-10-CM

## 2013-01-07 DIAGNOSIS — I10 Essential (primary) hypertension: Secondary | ICD-10-CM

## 2013-01-07 DIAGNOSIS — E785 Hyperlipidemia, unspecified: Secondary | ICD-10-CM

## 2013-01-07 DIAGNOSIS — F411 Generalized anxiety disorder: Secondary | ICD-10-CM

## 2013-01-07 MED ORDER — OMEPRAZOLE 40 MG PO CPDR: 40.0000 mg | DELAYED_RELEASE_CAPSULE | Freq: Every day | ORAL | Status: DC

## 2013-01-07 MED ORDER — METOPROLOL SUCCINATE ER 25 MG PO TB24: 25.0000 mg | ORAL_TABLET | Freq: Every day | ORAL | Status: DC

## 2013-01-07 MED ORDER — ZOLPIDEM TARTRATE 10 MG PO TABS: 10.0000 mg | ORAL_TABLET | Freq: Every evening | ORAL | Status: DC | PRN

## 2013-01-07 MED ORDER — TRAMADOL HCL 50 MG PO TABS: 50.0000 mg | ORAL_TABLET | Freq: Three times a day (TID) | ORAL | Status: DC | PRN

## 2013-01-07 MED ORDER — DIAZEPAM 10 MG PO TABS: 10.0000 mg | ORAL_TABLET | Freq: Three times a day (TID) | ORAL | Status: DC | PRN

## 2013-01-07 NOTE — Patient Instructions (Signed)
It was great to see you today! I am giving you a year of refills on all the medications I can and 6 month on the ambien and valium. If you are having problems with high blood pressure over the next month or so, or you have a lot of problems with side effects, send me a message through MyChart and we will change to Norvasc.

## 2013-01-14 NOTE — Assessment & Plan Note (Signed)
Patient not open to changing medications.  He does not really want to discuss anxiety at this time.  He is considering changing to another clinic so he can have a more permanent physician.  As he does not wish to go into details at this time, and will likely not be returning to our clinic, I will refill meds for 6 months.  If he has not changed physicians by that time, we will need to discuss transition to klonipin.

## 2013-01-14 NOTE — Progress Notes (Signed)
Patient ID: Jason Williams, male   DOB: Mar 01, 1961, 52 y.o.   MRN: [id number] Subjective: The patient is a 52 y.o. year old male who presents today for f/u.  1. HTN: No CP/SOB/DOE, no LE edema.  Taking meds.  2. HLD: Taking lipitor 40, no myalgias.  3. Anxiety: Related to job.  Has been receiving valium for some time for this.  Has tried SSRIs and has had problems with palpitations and feeling very odd on multiple of these medications.  4. Insomnia: Patient dependent on 10mg  ambien qhs for sleep.  He has been advised of long term risks of this medication and does not wish to change.  Patient's past medical, social, and family history were reviewed and updated as appropriate. History  Substance Use Topics  . Smoking status: Never Smoker   . Smokeless tobacco: Never Used  . Alcohol Use: 0.6 oz/week    1 Glasses of wine per week   Objective:  Filed Vitals:   01/07/13 1638  BP: 129/90  Pulse: 67  Temp: 98.8 F (37.1 C)   Gen: NAD3 CV: RRR Resp: CTABL Ext: No edema  Assessment/Plan:  Please also see individual problems in problem list for problem-specific plans.

## 2013-01-14 NOTE — Assessment & Plan Note (Signed)
Currently at goal.  Continue current therapy.

## 2013-01-14 NOTE — Assessment & Plan Note (Signed)
Cont current therapy.  Not due for lipids yet.

## 2013-01-14 NOTE — Assessment & Plan Note (Signed)
Risks (including MI and death) of long-term ambien discussed with patient who wishes to continue with this therapy.  Will provide 6 months of refills.

## 2013-02-09 ENCOUNTER — Emergency Department (HOSPITAL_BASED_OUTPATIENT_CLINIC_OR_DEPARTMENT_OTHER)
Admission: EM | Admit: 2013-02-09 | Discharge: 2013-02-09 | Disposition: A | Discharge status: Home / Self Care | Payer: BC Managed Care – PPO | Attending: Emergency Medicine | Admitting: Emergency Medicine

## 2013-02-09 ENCOUNTER — Encounter (HOSPITAL_BASED_OUTPATIENT_CLINIC_OR_DEPARTMENT_OTHER): Payer: Self-pay | Admitting: *Deleted

## 2013-02-09 DIAGNOSIS — F3289 Other specified depressive episodes: Secondary | ICD-10-CM | POA: Insufficient documentation

## 2013-02-09 DIAGNOSIS — E785 Hyperlipidemia, unspecified: Secondary | ICD-10-CM | POA: Insufficient documentation

## 2013-02-09 DIAGNOSIS — I10 Essential (primary) hypertension: Secondary | ICD-10-CM | POA: Insufficient documentation

## 2013-02-09 DIAGNOSIS — X58XXXA Exposure to other specified factors, initial encounter: Secondary | ICD-10-CM | POA: Insufficient documentation

## 2013-02-09 DIAGNOSIS — S058X9A Other injuries of unspecified eye and orbit, initial encounter: Secondary | ICD-10-CM | POA: Insufficient documentation

## 2013-02-09 DIAGNOSIS — F329 Major depressive disorder, single episode, unspecified: Secondary | ICD-10-CM | POA: Insufficient documentation

## 2013-02-09 DIAGNOSIS — S0510XA Contusion of eyeball and orbital tissues, unspecified eye, initial encounter: Secondary | ICD-10-CM | POA: Insufficient documentation

## 2013-02-09 DIAGNOSIS — K219 Gastro-esophageal reflux disease without esophagitis: Secondary | ICD-10-CM | POA: Insufficient documentation

## 2013-02-09 DIAGNOSIS — S0502XA Injury of conjunctiva and corneal abrasion without foreign body, left eye, initial encounter: Secondary | ICD-10-CM

## 2013-02-09 DIAGNOSIS — Y92009 Unspecified place in unspecified non-institutional (private) residence as the place of occurrence of the external cause: Secondary | ICD-10-CM | POA: Insufficient documentation

## 2013-02-09 DIAGNOSIS — F411 Generalized anxiety disorder: Secondary | ICD-10-CM | POA: Insufficient documentation

## 2013-02-09 DIAGNOSIS — Y9389 Activity, other specified: Secondary | ICD-10-CM | POA: Insufficient documentation

## 2013-02-09 DIAGNOSIS — Z79899 Other long term (current) drug therapy: Secondary | ICD-10-CM | POA: Insufficient documentation

## 2013-02-09 DIAGNOSIS — Z7982 Long term (current) use of aspirin: Secondary | ICD-10-CM | POA: Insufficient documentation

## 2013-02-09 MED ORDER — HYDROCODONE-ACETAMINOPHEN 5-325 MG PO TABS: 2.0000 | ORAL_TABLET | Freq: Once | ORAL | Status: DC

## 2013-02-09 MED ORDER — TETRACAINE HCL 0.5 % OP SOLN
1.0000 [drp] | Freq: Once | OPHTHALMIC | Status: AC
  Administered 2013-02-09: 1 [drp] via OPHTHALMIC
  Filled 2013-02-09: qty 2

## 2013-02-09 MED ORDER — TOBRAMYCIN 0.3 % OP SOLN: 1.0000 [drp] | OPHTHALMIC | Status: DC

## 2013-02-09 MED ORDER — HYDROCODONE-ACETAMINOPHEN 5-325 MG PO TABS: 2.0000 | ORAL_TABLET | ORAL | Status: DC | PRN

## 2013-02-09 MED ORDER — FLUORESCEIN SODIUM 1 MG OP STRP
1.0000 | ORAL_STRIP | Freq: Once | OPHTHALMIC | Status: AC
  Administered 2013-02-09: 1 via OPHTHALMIC
  Filled 2013-02-09: qty 1

## 2013-02-09 NOTE — ED Notes (Signed)
Patient c/o irritation in the left eye; visual acuity completed.  Patient reports he normally wears contact lens to correct vision, but due to the irritation is unable to wear them today.

## 2013-02-09 NOTE — ED Notes (Signed)
Pt states he thinks he may have gotten something in his left eye, but is not sure. 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 Sclera reddened.

## 2013-02-09 NOTE — ED Provider Notes (Signed)
Medical screening examination/treatment/procedure(s) were performed by non-physician practitioner and as supervising physician I was immediately available for consultation/collaboration.   Belicia Difatta B. Bernette Mayers, MD 02/09/13 [phone]

## 2013-02-09 NOTE — ED Provider Notes (Signed)
History     CSN: [id number]  Arrival date & time 02/09/13  1309   First MD Initiated Contact with Patient 02/09/13 1351      Chief Complaint  Patient presents with  . Eye Pain    (Consider location/radiation/quality/duration/timing/severity/associated sxs/prior treatment) Patient is a 52 y.o. male presenting with eye pain. The history is provided by the patient. No language interpreter was used.  Eye Pain This is a new problem. The problem occurs constantly. The problem has been gradually improving. Nothing aggravates the symptoms. He has tried nothing for the symptoms.  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  Pt feels like he has a foreign body in his left eye  Past Medical History  Diagnosis Date  . Anxiety   . Depression   . Depression   . GERD (gastroesophageal reflux disease)   . Hyperlipidemia   . Hypertension     Past Surgical History  Procedure Laterality Date  . Appendectomy  1981  . Ankle re construction  2011    Family History  Problem Relation Age of Onset  . Esophageal cancer Mother   . Heart disease Maternal Grandfather     History  Substance Use Topics  . Smoking status: Never Smoker   . Smokeless tobacco: Never Used  . Alcohol Use: 0.6 oz/week    1 Glasses of wine per week      Review of Systems  Eyes: Positive for pain.  All other systems reviewed and are negative.    Allergies  Review of patient's allergies indicates no known allergies.  Home Medications   Current Outpatient Rx  Name  Route  Sig  Dispense  Refill  . atorvastatin (LIPITOR) 40 MG tablet   Oral   Take 1 tablet (40 mg total) by mouth daily.   90 tablet   3   . [facility] ASPIRIN CHILD 81 MG chewable tablet      TAKE 1 PILL DAILY TO THIN BLOOD TO HELP TO PREVENT HEART ATTACK AND STROKE   90 tablet   3   . diazepam (VALIUM) 10 MG tablet   Oral   Take 1 tablet (10 mg total) by mouth every 8 (eight) hours as needed for anxiety. Take one tab by  mouth three times daily only if needed.   90 tablet   5   . EXPIRED: esomeprazole (NEXIUM) 20 MG capsule   Oral   Take 20 mg by mouth daily as needed. For acid reflux. Take 1 hour prior to first meal of the day.         . metoprolol succinate (TOPROL-XL) 25 MG 24 hr tablet   Oral   Take 1 tablet (25 mg total) by mouth daily.   90 tablet   3   . omeprazole (PRILOSEC) 40 MG capsule   Oral   Take 1 capsule (40 mg total) by mouth daily.   90 capsule   3   . traMADol (ULTRAM) 50 MG tablet   Oral   Take 1 tablet (50 mg total) by mouth every 8 (eight) hours as needed for pain.   90 tablet   6   . zolpidem (AMBIEN) 10 MG tablet   Oral   Take 1 tablet (10 mg total) by mouth at bedtime as needed for sleep.   30 tablet   5     BP 133/77  Pulse 63  Temp(Src) 97.9 F (36.6 C) (Oral)  Resp 18  Ht 5\' 10"  (1.778 m)  Wt 190 lb (86.183 kg)  BMI 27.26 kg/m2  SpO2 98%  Physical Exam  Nursing note and vitals reviewed. Constitutional: He is oriented to person, place, and time. He appears well-developed and well-nourished.  HENT:  Head: Normocephalic and atraumatic.  Eyes: Conjunctivae and EOM are normal. Pupils are equal, round, and reactive to light.  Pinpoint area of uptake of luroscein,   I swabbed eyelids, everted, no foreign bodies,    Neurological: He is alert and oriented to person, place, and time. He has normal reflexes.  Skin: Skin is warm.  Psychiatric: He has a normal mood and affect.    ED Course  Procedures (including critical care time)  Labs Reviewed - No data to display No results found.   No diagnosis found.    MDM  Pt advised follow up with opthoomologist if symptoms persist more than 36 hours       Lonia Skinner [place], [place] 02/09/13 1436

## 2013-05-16 ENCOUNTER — Telehealth: Payer: Self-pay | Admitting: Family Medicine

## 2013-05-16 NOTE — Telephone Encounter (Signed)
Will fwd to [place], RN in triage.  Galileah Piggee, Darlyne Russian, CMA

## 2013-05-16 NOTE — Telephone Encounter (Signed)
Pt has pain in his right eye. This has been going on about 5 days. Remedies are not working Please advise

## 2013-05-19 NOTE — Telephone Encounter (Signed)
Returned call to pt - left message checking on pt. Wyatt Haste, RN-BSN

## 2013-07-23 ENCOUNTER — Telehealth: Payer: Self-pay | Admitting: Family Medicine

## 2013-07-23 DIAGNOSIS — G4700 Insomnia, unspecified: Secondary | ICD-10-CM

## 2013-07-23 NOTE — Telephone Encounter (Signed)
See note below. Wyatt Haste, RN-BSN

## 2013-07-23 NOTE — Telephone Encounter (Signed)
Pt is calling asking for enough medication to last him until his appointment 9/22, he needs diazepam, tramadol, and ambien. JW

## 2013-07-24 MED ORDER — TRAMADOL HCL 50 MG PO TABS: 50.0000 mg | ORAL_TABLET | Freq: Three times a day (TID) | ORAL | Status: DC | PRN

## 2013-07-24 MED ORDER — ZOLPIDEM TARTRATE 10 MG PO TABS: 10.0000 mg | ORAL_TABLET | Freq: Every evening | ORAL | Status: DC | PRN

## 2013-07-24 MED ORDER — DIAZEPAM 10 MG PO TABS: 10.0000 mg | ORAL_TABLET | Freq: Three times a day (TID) | ORAL | Status: DC | PRN

## 2013-07-24 NOTE — Telephone Encounter (Signed)
I called Jason Williams and told him his prescription is ready for pickup at the office. He will pick it up and I will see him at his upcoming office visit.

## 2013-08-18 ENCOUNTER — Encounter: Payer: Self-pay | Admitting: Family Medicine

## 2013-08-18 ENCOUNTER — Ambulatory Visit (INDEPENDENT_AMBULATORY_CARE_PROVIDER_SITE_OTHER): Payer: BC Managed Care – PPO | Admitting: Family Medicine

## 2013-08-18 VITALS — BP 134/78 | HR 89 | Temp 97.9°F | Resp 16 | Ht 70.0 in | Wt 203.0 lb

## 2013-08-18 DIAGNOSIS — I10 Essential (primary) hypertension: Secondary | ICD-10-CM

## 2013-08-18 DIAGNOSIS — G4700 Insomnia, unspecified: Secondary | ICD-10-CM

## 2013-08-18 MED ORDER — ATORVASTATIN CALCIUM 40 MG PO TABS: 40.0000 mg | ORAL_TABLET | Freq: Every day | ORAL | Status: DC

## 2013-08-18 MED ORDER — DIAZEPAM 10 MG PO TABS: 10.0000 mg | ORAL_TABLET | Freq: Three times a day (TID) | ORAL | Status: DC | PRN

## 2013-08-18 MED ORDER — TRAMADOL HCL 50 MG PO TABS: 50.0000 mg | ORAL_TABLET | Freq: Three times a day (TID) | ORAL | Status: DC | PRN

## 2013-08-18 MED ORDER — HYDROCHLOROTHIAZIDE 25 MG PO TABS: 25.0000 mg | ORAL_TABLET | Freq: Every day | ORAL | Status: DC

## 2013-08-18 MED ORDER — ZOLPIDEM TARTRATE 10 MG PO TABS: 10.0000 mg | ORAL_TABLET | Freq: Every evening | ORAL | Status: DC | PRN

## 2013-08-18 NOTE — Patient Instructions (Addendum)
It was great meeting you Jason Williams,  Today we spoke about a few things:  1. Hypertension: I will be refilling your medication. Please continue taking as you have. Please take home blood pressure readings as well. 2. Please come back in 1-1 1/2 weeks to get labs drawn. Please do not eat before coming in.  I have refilled the rest of your medication. Please schedule an appointment in 3 months to see me again.

## 2013-08-19 ENCOUNTER — Other Ambulatory Visit: Payer: Self-pay | Admitting: Family Medicine

## 2013-08-21 NOTE — Progress Notes (Signed)
  Subjective:    Patient ID: Jason Williams, male    DOB: January 29, 1961, 52 y.o.   MRN: [id number]  HPI Comments: No complaints today. Wants to get refills. Happy to have a doctor that he will be with for the next three years.     Review of Systems  Eyes: Positive for visual disturbance.  All other systems reviewed and are negative.       Objective:   Physical Exam  Vitals reviewed. Constitutional: He is oriented to person, place, and time. He appears well-developed and well-nourished. No distress.  Cardiovascular: Normal rate, regular rhythm and normal heart sounds.   Pulmonary/Chest: Effort normal and breath sounds normal. No respiratory distress. He has no wheezes. He has no rales.  Abdominal: Soft. He exhibits no distension. There is no tenderness. There is no rebound.  Musculoskeletal: Normal range of motion. He exhibits no edema and no tenderness.  Neurological: He is alert and oriented to person, place, and time.  Skin: Skin is warm and dry. He is not diaphoretic. No erythema.          Assessment & Plan:

## 2013-08-26 NOTE — Assessment & Plan Note (Addendum)
Currently at goal. I will ontinue current therapy

## 2013-09-03 NOTE — Telephone Encounter (Signed)
Already a prescription sent from visit.

## 2013-09-16 ENCOUNTER — Other Ambulatory Visit: Payer: BC Managed Care – PPO

## 2013-09-16 DIAGNOSIS — I10 Essential (primary) hypertension: Secondary | ICD-10-CM

## 2013-09-16 LAB — CBC WITH DIFFERENTIAL/PLATELET
Basophils Relative: 0 % (ref 0–1)
Eosinophils Absolute: 0.1 10*3/uL (ref 0.0–0.7)
Eosinophils Relative: 2 % (ref 0–5)
Hemoglobin: 15 g/dL (ref 13.0–17.0)
Lymphocytes Relative: 22 % (ref 12–46)
Lymphs Abs: 1.4 10*3/uL (ref 0.7–4.0)
MCH: 31.4 pg (ref 26.0–34.0)
MCHC: 35.2 g/dL (ref 30.0–36.0)
MCV: 89.3 fL (ref 78.0–100.0)
Monocytes Relative: 9 % (ref 3–12)
Neutrophils Relative %: 67 % (ref 43–77)
RBC: 4.77 MIL/uL (ref 4.22–5.81)
RDW: 14 % (ref 11.5–15.5)

## 2013-09-16 LAB — COMPREHENSIVE METABOLIC PANEL
ALT: 23 U/L (ref 0–53)
AST: 17 U/L (ref 0–37)
Albumin: 4 g/dL (ref 3.5–5.2)
Alkaline Phosphatase: 51 U/L (ref 39–117)
Calcium: 9.1 mg/dL (ref 8.4–10.5)
Chloride: 104 mEq/L (ref 96–112)
Glucose, Bld: 91 mg/dL (ref 70–99)
Potassium: 4.5 mEq/L (ref 3.5–5.3)
Sodium: 139 mEq/L (ref 135–145)
Total Bilirubin: 0.5 mg/dL (ref 0.3–1.2)
Total Protein: 6.7 g/dL (ref 6.0–8.3)

## 2013-09-16 LAB — LIPID PANEL
Cholesterol: 158 mg/dL (ref 0–200)
LDL Cholesterol: 80 mg/dL (ref 0–99)
Total CHOL/HDL Ratio: 3.4 Ratio
VLDL: 32 mg/dL (ref 0–40)

## 2013-09-16 NOTE — Progress Notes (Signed)
CBC WITH DIFF,CMP AND FLP DONE TODAY Jason Williams 

## 2013-09-19 ENCOUNTER — Telehealth: Payer: Self-pay

## 2013-09-19 NOTE — Telephone Encounter (Signed)
Patient calls inquiring about lab results from 10/21. Please call patient.

## 2013-09-23 ENCOUNTER — Other Ambulatory Visit: Payer: Self-pay | Admitting: Family Medicine

## 2013-09-23 DIAGNOSIS — G4700 Insomnia, unspecified: Secondary | ICD-10-CM

## 2013-09-23 MED ORDER — DIAZEPAM 10 MG PO TABS: 10.0000 mg | ORAL_TABLET | Freq: Three times a day (TID) | ORAL | Status: DC | PRN

## 2013-09-23 MED ORDER — TRAMADOL HCL 50 MG PO TABS: 50.0000 mg | ORAL_TABLET | Freq: Three times a day (TID) | ORAL | Status: DC | PRN

## 2013-09-23 MED ORDER — ZOLPIDEM TARTRATE 10 MG PO TABS: 10.0000 mg | ORAL_TABLET | Freq: Every evening | ORAL | Status: DC | PRN

## 2013-09-23 NOTE — Telephone Encounter (Signed)
Spoke with patient regarding his labs. He was thankful for the call and requested future refills for controlled medication. I will write prescriptions for him to pick up from the office.

## 2013-10-03 ENCOUNTER — Telehealth: Payer: Self-pay | Admitting: *Deleted

## 2013-10-03 MED ORDER — ZOLPIDEM TARTRATE 10 MG PO TABS: 10.0000 mg | ORAL_TABLET | Freq: Every evening | ORAL | Status: DC | PRN

## 2013-10-03 MED ORDER — TRAMADOL HCL 50 MG PO TABS: 50.0000 mg | ORAL_TABLET | Freq: Three times a day (TID) | ORAL | Status: DC | PRN

## 2013-10-03 MED ORDER — DIAZEPAM 10 MG PO TABS: 10.0000 mg | ORAL_TABLET | Freq: Three times a day (TID) | ORAL | Status: DC | PRN

## 2013-10-03 NOTE — Progress Notes (Signed)
Prescribed 2 month supply of ambien, tramadol and diazepam. First set are available to be filled today. Second set are available to be refilled at the earliest on 11/01/2013. I called the patient and he is aware that his medication will be available at the front of the office.

## 2013-10-03 NOTE — Telephone Encounter (Signed)
Jacquelin Hawking, MD at 10/03/2013 3:19 PM    Status: Signed        Prescribed 2 month supply of ambien, tramadol and diazepam. First set are available to be filled today. Second set are available to be refilled at the earliest on 11/01/2013. I called the patient and he is aware that his medication will be available at the front of the office.    SPOKE WITH PATIENT AND INFORMED HIM OF BELOW

## 2013-10-09 ENCOUNTER — Telehealth: Payer: Self-pay | Admitting: Family Medicine

## 2013-10-09 NOTE — Telephone Encounter (Signed)
Pt is upset because it has taken over  3 weeks to get his ambien and tramadol filled by Dr Caleb Popp. He says the pharmacy called twice yesterday but received no call back from [place]. He said it took dr netty over 2 weeks to get back to him when he requested his refills [facility] [address] [facility] [place] says they need a DEA number Please contact pt when this has been done.

## 2013-10-09 NOTE — Telephone Encounter (Signed)
Left message on patient voicemail that valium,ambien and tramadol were call in on the 7th of November and [facility]. Nile Dorning, Virgel Bouquet

## 2013-11-24 ENCOUNTER — Telehealth: Payer: Self-pay | Admitting: Family Medicine

## 2013-11-24 DIAGNOSIS — R358 Other polyuria: Secondary | ICD-10-CM

## 2013-11-24 NOTE — Telephone Encounter (Signed)
Pt is calling because the hydrochlorothiazide he is taking for his BP is making him dehydrated and would like to get something else. jw

## 2013-12-03 NOTE — Telephone Encounter (Signed)
Please advise. Jason Williams  

## 2013-12-03 NOTE — Telephone Encounter (Signed)
Pt is calling back about his bp med since he hasnt heard anything

## 2013-12-04 ENCOUNTER — Other Ambulatory Visit: Payer: Self-pay | Admitting: Family Medicine

## 2013-12-04 NOTE — Telephone Encounter (Signed)
Returning call from patient regarding concern about increased urination and thirst. Over the past few months, he has been constantly thirsty with an associated increase in frequency of urination. He has no dysuria, no hesitency and no urgency. He has had some dry mouth but no lightheadedness or dizziness. He has been cutting back on his HCTZ due to his symptoms. He reports no known family history of diabetes.  Has states that he has reservations about starting new medications because of side effects since he feels like his father had adverse reactions possibly causing his death.  Recommended patient schedule appointment with office to obtain fasting CBG and urinalysis and will discuss options after results are available. Patient understood plan and was agreeable.

## 2013-12-05 ENCOUNTER — Telehealth: Payer: Self-pay | Admitting: Family Medicine

## 2013-12-05 DIAGNOSIS — G4700 Insomnia, unspecified: Secondary | ICD-10-CM

## 2013-12-05 MED ORDER — ZOLPIDEM TARTRATE 10 MG PO TABS: 10.0000 mg | ORAL_TABLET | Freq: Every evening | ORAL | Status: DC | PRN

## 2013-12-05 MED ORDER — TRAMADOL HCL 50 MG PO TABS: 50.0000 mg | ORAL_TABLET | Freq: Three times a day (TID) | ORAL | Status: DC | PRN

## 2013-12-05 MED ORDER — DIAZEPAM 10 MG PO TABS: 10.0000 mg | ORAL_TABLET | Freq: Three times a day (TID) | ORAL | Status: DC | PRN

## 2013-12-05 NOTE — Telephone Encounter (Signed)
Refilled Tramadol x3 refills total; Ambien x3 refills total; diazepam x3 refills total

## 2013-12-05 NOTE — Telephone Encounter (Signed)
Needs refills on tramadol, daizepam and ambin. [facility] [address] [place] Please call pt when refills are sent to pharmacy

## 2013-12-17 ENCOUNTER — Other Ambulatory Visit (INDEPENDENT_AMBULATORY_CARE_PROVIDER_SITE_OTHER): Payer: BC Managed Care – PPO

## 2013-12-17 DIAGNOSIS — R358 Other polyuria: Secondary | ICD-10-CM

## 2013-12-17 DIAGNOSIS — R3589 Other polyuria: Secondary | ICD-10-CM

## 2013-12-17 LAB — POCT URINALYSIS DIPSTICK
Bilirubin, UA: NEGATIVE
Blood, UA: NEGATIVE
Glucose, UA: NEGATIVE
Ketones, UA: NEGATIVE
LEUKOCYTES UA: NEGATIVE
Nitrite, UA: NEGATIVE
PROTEIN UA: NEGATIVE
Urobilinogen, UA: 0.2
pH, UA: 6

## 2013-12-17 LAB — GLUCOSE, CAPILLARY: Glucose-Capillary: 95 mg/dL (ref 70–99)

## 2013-12-17 NOTE — Progress Notes (Signed)
Fasting glucose = 95 mg/dL   Urine = neg   Jason Williams, MLS

## 2014-01-05 ENCOUNTER — Telehealth: Payer: Self-pay | Admitting: *Deleted

## 2014-01-05 DIAGNOSIS — F411 Generalized anxiety disorder: Secondary | ICD-10-CM

## 2014-01-05 DIAGNOSIS — M25579 Pain in unspecified ankle and joints of unspecified foot: Secondary | ICD-10-CM

## 2014-01-05 DIAGNOSIS — G4700 Insomnia, unspecified: Secondary | ICD-10-CM

## 2014-01-05 NOTE — Telephone Encounter (Signed)
Phone call from pt regarding his Rx for tramadol, Ambien and diazepam.  Pt stated that when he called to get refills that the pharmacy was having problems with PCP DEA number.  [facility], [facility] stated that they are having some trouble with DEA numbers and need a new prescription for all three medications.  She was going to void the old prescriptions that were in the system.  Pt stated he needed his medications ASAP.  Pt did not take medication yesterday.  Please call in new Rx for the tramadol, Ambien and diazepam.  [phone].  Derl Barrow, RN

## 2014-01-06 MED ORDER — TRAMADOL HCL 50 MG PO TABS: 50.0000 mg | ORAL_TABLET | Freq: Three times a day (TID) | ORAL | Status: DC | PRN

## 2014-01-06 MED ORDER — DIAZEPAM 10 MG PO TABS: ORAL_TABLET | ORAL | Status: DC

## 2014-01-06 MED ORDER — ZOLPIDEM TARTRATE 10 MG PO TABS: 10.0000 mg | ORAL_TABLET | Freq: Every evening | ORAL | Status: DC | PRN

## 2014-01-06 NOTE — Telephone Encounter (Signed)
Returned call to pt regarding medication refills.  Pt informed that his information is being forward to a Preceptor for review and will try and get medications refilled as soon as possible.  Pt stated is not upset with nurse, but with the fact when he tries to get something done here it takes several weeks.  Apologized to pt regarding the issue.  Derl Barrow, RN

## 2014-01-06 NOTE — Telephone Encounter (Signed)
[facility] Dr. Verlon Au notes.  Pt informed that refills have been called in and he needed appt with Dr. Teryl Lucy.  Appt made for 01/21/2014 at 2 PM for office visit.  Derl Barrow, RN

## 2014-01-06 NOTE — Telephone Encounter (Signed)
Pt called again about his refills. He is not happy about the response time about anything he tries to do here. Would like to speak to director.

## 2014-01-06 NOTE — Telephone Encounter (Signed)
Notified as Market researcher that there is problem with some resident's DEA numbers not going through. Our Pharm D is checking into this. I will give this patient ONE MONTH of meds--I will NOT give refills as he is on high doses and I do not know him--Dr. Lonny Prude saw him recently and I spoke with Dr Lonny Prude. I will have him make appt with Dr Lonny Prude in next 3 weeks---MUST have appt before any more refills. We need UDS and pain/chronic benzodiazepine contract discussed.  TAMIKA You can call these in as below--note ONE MONTH only and he needs appt with Dr Lonny Prude in 3-4 weeks--no further refills w/o appt. Sorry for the inconcvvenience

## 2014-01-21 ENCOUNTER — Ambulatory Visit (INDEPENDENT_AMBULATORY_CARE_PROVIDER_SITE_OTHER): Payer: BC Managed Care – PPO | Admitting: Family Medicine

## 2014-01-21 VITALS — BP 134/93 | HR 85 | Temp 98.0°F | Wt 199.0 lb

## 2014-01-21 DIAGNOSIS — F411 Generalized anxiety disorder: Secondary | ICD-10-CM

## 2014-01-21 DIAGNOSIS — Z23 Encounter for immunization: Secondary | ICD-10-CM

## 2014-01-21 DIAGNOSIS — G4700 Insomnia, unspecified: Secondary | ICD-10-CM

## 2014-01-21 DIAGNOSIS — I10 Essential (primary) hypertension: Secondary | ICD-10-CM

## 2014-01-21 DIAGNOSIS — M25579 Pain in unspecified ankle and joints of unspecified foot: Secondary | ICD-10-CM

## 2014-01-21 MED ORDER — ZOLPIDEM TARTRATE 10 MG PO TABS: 10.0000 mg | ORAL_TABLET | Freq: Every evening | ORAL | Status: DC | PRN

## 2014-01-21 MED ORDER — ATORVASTATIN CALCIUM 40 MG PO TABS: 40.0000 mg | ORAL_TABLET | Freq: Every day | ORAL | Status: DC

## 2014-01-21 MED ORDER — DIAZEPAM 10 MG PO TABS: 10.0000 mg | ORAL_TABLET | Freq: Two times a day (BID) | ORAL | Status: DC

## 2014-01-21 MED ORDER — TRAMADOL HCL 50 MG PO TABS: 50.0000 mg | ORAL_TABLET | Freq: Three times a day (TID) | ORAL | Status: DC | PRN

## 2014-01-21 MED ORDER — AMLODIPINE BESYLATE 5 MG PO TABS: 5.0000 mg | ORAL_TABLET | Freq: Every day | ORAL | Status: DC

## 2014-01-21 NOTE — Progress Notes (Signed)
   Subjective:    Patient ID: Jason Williams, male    DOB: 06/07/1961, 53 y.o.   MRN: [id number]  HPI  Hypertension  Patient here for follow-up of elevated blood pressure.  He is exercising and is adherent to a low-salt diet. He currently takes hydrochlorothiazide and is adherent to regimen. Blood pressure is sometimes controlled at home with systolic pressures in the 135-145 range and diastolic pressures in the 85-95 range. He has had a history of chest pain in the past and has been worked up with prior stress test. Last stress test was about 3 years ago. He is not currently having chest pain. Cardiovascular risk factors: dyslipidemia, hypertension and male gender. Use of agents associated with hypertension: none. History of target organ damage: none.  Anxiety  Patient is here for follow-up. He feels his anxiety is controlled on his current regimen of Diazepam 10mg  TID PRN. No side effects of medication. He is willing to sign a controlled substance contract today.  GAD-7: 13  Review of Systems Please refer to HPI    Objective:   Physical Exam  Constitutional: He appears well-developed and well-nourished.  Cardiovascular: Normal rate, regular rhythm and normal heart sounds.   Pulmonary/Chest: Effort normal and breath sounds normal.       Assessment & Plan:

## 2014-01-21 NOTE — Patient Instructions (Signed)
Jason Williams, it was a pleasure seeing you today. Today we talked about your anxiety and hypertension.   1. Anxiety: We will continue your diazepam. I will write for you to take it twice a day scheduled instead of as needed. I also had you sign a controlled substance contract. Per this contract, I will periodically have you take urine tests. This contract also means that to get refills means you will have to be seen. 2. Hypertension: We will discontinue your hydrochlorothiazide because of your side effects. We will start amlodipine 5mg  every day and eventually work our way up to 10mg   I have refilled the rest of your medication.  If you have any questions or concerns, please do not hesitate to call the office at [phone].  Sincerely,  Cordelia Poche, MD

## 2014-01-31 NOTE — Assessment & Plan Note (Addendum)
Slightly above goal. Patient wants to switch medications because he believes the HCTZ is causing him his recent increased urination during night time. I will switch to amlodipine 5mg  and see him in 1-2 weeks to reassess his blood pressure.

## 2014-01-31 NOTE — Assessment & Plan Note (Addendum)
Patient states his anxiety is only controlled with medication. Is not open to therapy as an adjunct to medication management. Will continue current regimen and attempt to revisit this discussion at next appointment. Patient signed controlled substance contract today.

## 2014-02-05 ENCOUNTER — Ambulatory Visit: Payer: Self-pay | Admitting: Family Medicine

## 2014-03-03 ENCOUNTER — Telehealth: Payer: Self-pay | Admitting: Family Medicine

## 2014-03-03 DIAGNOSIS — M25579 Pain in unspecified ankle and joints of unspecified foot: Secondary | ICD-10-CM

## 2014-03-03 DIAGNOSIS — G4700 Insomnia, unspecified: Secondary | ICD-10-CM

## 2014-03-03 DIAGNOSIS — F411 Generalized anxiety disorder: Secondary | ICD-10-CM

## 2014-03-03 NOTE — Telephone Encounter (Signed)
Refill request for Tramadol, Ambien, and Valium. Patient has been made an appt for 4/24 to see Dr. Lonny Prude but will be out of meds before appt.

## 2014-03-03 NOTE — Telephone Encounter (Signed)
Will forward to PCP 

## 2014-03-04 NOTE — Telephone Encounter (Signed)
Patient signed controlled substance contract and went through terms. Will not be refilling Ambien or Valium. Will refill tramadol.

## 2014-03-05 NOTE — Telephone Encounter (Signed)
Spoke with patient and he is wanting to speak with Dr. Lonny Prude. Patient stated that the contract was not to cut medications but that he will have to have urine tests.

## 2014-03-06 MED ORDER — ZOLPIDEM TARTRATE 10 MG PO TABS: 10.0000 mg | ORAL_TABLET | Freq: Every evening | ORAL | Status: DC | PRN

## 2014-03-06 MED ORDER — TRAMADOL HCL 50 MG PO TABS: 50.0000 mg | ORAL_TABLET | Freq: Three times a day (TID) | ORAL | Status: DC | PRN

## 2014-03-06 MED ORDER — DIAZEPAM 10 MG PO TABS: 10.0000 mg | ORAL_TABLET | Freq: Two times a day (BID) | ORAL | Status: DC

## 2014-03-06 NOTE — Telephone Encounter (Signed)
Patient would like to speak to supervisor about the issues below. Please advise.

## 2014-03-06 NOTE — Telephone Encounter (Signed)
Patient called me to discuss his med refills (Ambien and Valium).  Patient states he "needs some continuity with care."  Signed a contract that he would consent to drug testing as needed.  States he has been taking Ambien for 10 years.  Went to sleep at 3:45 am this morning and had to be up at 7:00 am.  Usually sleeps 4-6 hours when on med, "which is better than no sleep at all."  Patient states he did not sign a contract to discontinue his meds.  Would like to speak with Dr. Lonny Prude today since it is Friday and does not want to go through the weekend without his meds.  Message routed to Dr. Lonny Prude.  Burna Forts, BSN, RN-BC

## 2014-03-06 NOTE — Telephone Encounter (Signed)
Spoke with patient and went over the terms of the controlled substance contract. Explained to him that since I cannot see him in clinic in a timely fashion that I will refill his medication until his next visit. This will be the first and last time this exception is made. Terms will be revisited at next scheduled visit. Patient is aware of this and understands. He will see me on 4/24. I will refill Ambien, tramadol and Valium.

## 2014-03-20 ENCOUNTER — Ambulatory Visit (HOSPITAL_COMMUNITY)
Admission: RE | Admit: 2014-03-20 | Discharge: 2014-03-20 | Disposition: A | Discharge status: Home / Self Care | Payer: BC Managed Care – PPO | Source: Ambulatory Visit | Attending: Family Medicine | Admitting: Family Medicine

## 2014-03-20 ENCOUNTER — Encounter: Payer: Self-pay | Admitting: Family Medicine

## 2014-03-20 ENCOUNTER — Ambulatory Visit (INDEPENDENT_AMBULATORY_CARE_PROVIDER_SITE_OTHER): Payer: BC Managed Care – PPO | Admitting: Family Medicine

## 2014-03-20 VITALS — BP 132/79 | HR 81 | Temp 98.2°F | Ht 70.0 in | Wt 200.3 lb

## 2014-03-20 DIAGNOSIS — R079 Chest pain, unspecified: Secondary | ICD-10-CM | POA: Insufficient documentation

## 2014-03-20 DIAGNOSIS — G4700 Insomnia, unspecified: Secondary | ICD-10-CM

## 2014-03-20 DIAGNOSIS — F411 Generalized anxiety disorder: Secondary | ICD-10-CM

## 2014-03-20 DIAGNOSIS — R0982 Postnasal drip: Secondary | ICD-10-CM

## 2014-03-20 DIAGNOSIS — M25579 Pain in unspecified ankle and joints of unspecified foot: Secondary | ICD-10-CM

## 2014-03-20 DIAGNOSIS — I10 Essential (primary) hypertension: Secondary | ICD-10-CM

## 2014-03-20 MED ORDER — FLUTICASONE PROPIONATE 50 MCG/ACT NA SUSP: 2.0000 | Freq: Every day | NASAL | Status: DC

## 2014-03-20 MED ORDER — DIAZEPAM 10 MG PO TABS: 10.0000 mg | ORAL_TABLET | Freq: Three times a day (TID) | ORAL | Status: DC

## 2014-03-20 MED ORDER — AMLODIPINE BESYLATE 5 MG PO TABS: 5.0000 mg | ORAL_TABLET | Freq: Every day | ORAL | Status: DC

## 2014-03-20 MED ORDER — ZOLPIDEM TARTRATE 10 MG PO TABS: 10.0000 mg | ORAL_TABLET | Freq: Every evening | ORAL | Status: DC | PRN

## 2014-03-20 MED ORDER — TRAMADOL HCL 50 MG PO TABS: 50.0000 mg | ORAL_TABLET | Freq: Three times a day (TID) | ORAL | Status: DC | PRN

## 2014-03-20 MED ORDER — ATORVASTATIN CALCIUM 40 MG PO TABS: 40.0000 mg | ORAL_TABLET | Freq: Every day | ORAL | Status: DC

## 2014-03-20 NOTE — Assessment & Plan Note (Signed)
Pain currently controlled on tramadol. Will continue current regimen.

## 2014-03-20 NOTE — Assessment & Plan Note (Signed)
Blood pressure controled today. Will continue amlodipine 5mg  daily

## 2014-03-20 NOTE — Progress Notes (Signed)
   Subjective:    Patient ID: Jason Williams, male    DOB: 04/18/1961, 53 y.o.   MRN: [id number]  HPI  Cough: patient presents with history of cough for the past 1 day with productive yellow sputum  Chronic problems Patient has history of generalized anxiety, currently adherent with diazepam regimen with inadequate control of his anxiety. Most of his anxiety is caused by his job, which he is not able to do much to change. He has a history of plantar fasciitis and is currently adequately controlled on tramadol for pain with no side effects. Patient also has a history of poor sleep hygiene which is adequately controlled with Ambien nightly and no side effects. He has a history of controlled hypertension, currently on amlodipine Has history of chest pain that occurs with or without exertion and is relieved after a few minutes with no radiation.   Review of Systems  HENT: Positive for postnasal drip.   Respiratory: Positive for cough. Negative for shortness of breath.   Psychiatric/Behavioral: Positive for sleep disturbance and agitation. The patient is nervous/anxious.        Objective:   Physical Exam  Constitutional: He is oriented to person, place, and time. He appears well-developed and well-nourished.  HENT:  Mouth/Throat: No posterior oropharyngeal erythema.  Cobblestoning identified  Cardiovascular: Normal rate and regular rhythm.   No murmur heard. Pulmonary/Chest: Effort normal. No respiratory distress.  Neurological: He is alert and oriented to person, place, and time.   EKG: Normal sinus rhythm.     Assessment & Plan:

## 2014-03-20 NOTE — Assessment & Plan Note (Signed)
Most likely cause of patient's cough. Will prescribe Flonase 1-2 sprays in each nostril before bed

## 2014-03-20 NOTE — Assessment & Plan Note (Signed)
Patient currently on Ambien, which helps. Will continue current regimen.

## 2014-03-20 NOTE — Patient Instructions (Signed)
Jason Williams, it was a pleasure seeing you today. Today we talked about your anxiety, sleeping, pain and hypertension. I am increasing you back to diazepam 10mg  three times per day. I will continue your Ambien and Tramadol. Regarding your high blood pressure, it looks great today. I will refer you to cardiology for an exercise stress test because of your chest pain.  Please make an appointment to see me in 4 weeks.  If you have any questions or concerns, please do not hesitate to call the office at [phone].  Sincerely,  Cordelia Poche, MD

## 2014-03-20 NOTE — Assessment & Plan Note (Addendum)
Patient signed controlled substance contract a second time today because of a misunderstanding last visit. Currently on diazepam 10mg  bid, which has not been helping to control his anxiety. His GAD-7 today is 16. Will increase back to TID dosing and may consider adding SSRI. Has tried a few in the past, but may try Celexa. Patient also possibly open to therapy

## 2014-03-20 NOTE — Assessment & Plan Note (Signed)
Patient has long history of intermittent chest pain sometimes exertional. Will refer to cardiology for stress test. EKG normal sinus rhythm in clinic.

## 2014-03-24 ENCOUNTER — Encounter: Payer: Self-pay | Admitting: *Deleted

## 2014-03-27 ENCOUNTER — Institutional Professional Consult (permissible substitution): Payer: Self-pay | Admitting: Cardiology

## 2014-03-30 ENCOUNTER — Telehealth: Payer: Self-pay | Admitting: Family Medicine

## 2014-03-30 NOTE — Telephone Encounter (Signed)
Left message on patients voicemail,informing  Patient that he  would need to reschedule or change appointment if necessary,we schedule intal visit only.Requested a return call if he had more question or concern.[place]

## 2014-03-30 NOTE — Telephone Encounter (Signed)
Pt needs appt for stress test rescheduled.  He was told by them the only reason it was scheduled for then was because Dr Lonny Prude had requested it. Pt needs to have the appt before may 21 because he will be going out of town with his job.

## 2014-04-14 ENCOUNTER — Ambulatory Visit (INDEPENDENT_AMBULATORY_CARE_PROVIDER_SITE_OTHER): Payer: BC Managed Care – PPO | Admitting: Family Medicine

## 2014-04-14 ENCOUNTER — Encounter: Payer: Self-pay | Admitting: Family Medicine

## 2014-04-14 VITALS — BP 123/72 | HR 83 | Temp 98.1°F | Ht 70.0 in | Wt 191.0 lb

## 2014-04-14 DIAGNOSIS — F411 Generalized anxiety disorder: Secondary | ICD-10-CM

## 2014-04-14 DIAGNOSIS — I10 Essential (primary) hypertension: Secondary | ICD-10-CM

## 2014-04-14 DIAGNOSIS — M25579 Pain in unspecified ankle and joints of unspecified foot: Secondary | ICD-10-CM

## 2014-04-14 DIAGNOSIS — G4700 Insomnia, unspecified: Secondary | ICD-10-CM

## 2014-04-14 MED ORDER — ZOLPIDEM TARTRATE ER 12.5 MG PO TBCR: 12.5000 mg | EXTENDED_RELEASE_TABLET | Freq: Every evening | ORAL | Status: DC | PRN

## 2014-04-14 MED ORDER — DIAZEPAM 10 MG PO TABS: 10.0000 mg | ORAL_TABLET | Freq: Three times a day (TID) | ORAL | Status: DC

## 2014-04-14 MED ORDER — TRAMADOL HCL 50 MG PO TABS: 50.0000 mg | ORAL_TABLET | Freq: Three times a day (TID) | ORAL | Status: DC | PRN

## 2014-04-14 NOTE — Assessment & Plan Note (Signed)
Will continue current medication regimen.

## 2014-04-14 NOTE — Assessment & Plan Note (Signed)
Will switch to Ambien CR.

## 2014-04-14 NOTE — Assessment & Plan Note (Signed)
Refill tramadol

## 2014-04-14 NOTE — Assessment & Plan Note (Signed)
Blood pressure at goal. Will continue current regimen.

## 2014-04-14 NOTE — Progress Notes (Signed)
   Subjective:    Patient ID: Jason Williams, male    DOB: 08-05-1961, 53 y.o.   MRN: [id number]  HPI  Patient presents for management of anxiety, insomnia, ankle pain and hypertension. Anxiety is controlled on diazepam TID. Patient feels symptoms are adequately controlled. Feels anxious about general things. Has tried therapy alone in the past without success but is willing to try therapy with medication management. He has had less stress from his job recently. Insomnia is managed with Ambien. Patient sleeps for about 4-5 hours and wakes up. He has trouble falling asleep for about one hour but eventually goes to sleep for another 2 hours. Ankle pain is controlled on tramadol with adequate response. Hypertension is currently managed with amlodipine with no side effects.  Review of Systems  Respiratory: Negative for shortness of breath.   Cardiovascular: Negative for chest pain, palpitations and leg swelling.  Neurological: Negative for headaches.  Psychiatric/Behavioral: Positive for sleep disturbance. Negative for confusion. The patient is nervous/anxious.       Objective:   Physical Exam  Constitutional: He is oriented to person, place, and time. He appears well-developed and well-nourished.  Eyes: Conjunctivae and EOM are normal.  Cardiovascular: Normal rate, regular rhythm, normal heart sounds and intact distal pulses.   Pulmonary/Chest: Effort normal and breath sounds normal.  Neurological: He is alert and oriented to person, place, and time.  Skin: Skin is warm and dry.       Assessment & Plan:

## 2014-04-14 NOTE — Patient Instructions (Signed)
Jason Williams, it was a pleasure seeing you today. Today we talked about your anxiety, blood pressure and sleep. I am going to change your Ambien to a controlled release version which will hopefully help with how long you stay asleep. Your blood pressure is controlled today so I will not be making any changes. I will not make any changes to your anxiety medication either.   Please make an appointment to see me in 3 months.  If you have any questions or concerns, please do not hesitate to call the office at [phone].  Sincerely,  Cordelia Poche, MD

## 2014-04-17 ENCOUNTER — Institutional Professional Consult (permissible substitution): Payer: Self-pay | Admitting: Cardiology

## 2014-04-21 ENCOUNTER — Telehealth: Payer: Self-pay | Admitting: Family Medicine

## 2014-04-21 NOTE — Telephone Encounter (Signed)
Pharmacy says tramadol rx needs to be sent to [facility] for a 90 day supply Please advise

## 2014-04-23 ENCOUNTER — Other Ambulatory Visit: Payer: Self-pay | Admitting: *Deleted

## 2014-04-23 DIAGNOSIS — F411 Generalized anxiety disorder: Secondary | ICD-10-CM

## 2014-04-23 DIAGNOSIS — M25579 Pain in unspecified ankle and joints of unspecified foot: Secondary | ICD-10-CM

## 2014-04-23 NOTE — Telephone Encounter (Signed)
Request for 90 day supply. Wilhelmine Krogstad L Katrin Grabel, RN  

## 2014-04-23 NOTE — Telephone Encounter (Signed)
Pt called because [facility] needs his Amlodipine to be in 90 day for them to be able to fill this. This is a requirement of his insurance for them to cover this. jw

## 2014-04-24 MED ORDER — FLUTICASONE PROPIONATE 50 MCG/ACT NA SUSP: 2.0000 | Freq: Every day | NASAL | Status: DC

## 2014-04-24 MED ORDER — AMLODIPINE BESYLATE 5 MG PO TABS: 5.0000 mg | ORAL_TABLET | Freq: Every day | ORAL | Status: DC

## 2014-04-27 ENCOUNTER — Encounter: Payer: Self-pay | Admitting: Cardiovascular Disease

## 2014-04-27 ENCOUNTER — Ambulatory Visit (INDEPENDENT_AMBULATORY_CARE_PROVIDER_SITE_OTHER): Payer: BC Managed Care – PPO | Admitting: Cardiovascular Disease

## 2014-04-27 VITALS — BP 137/90 | HR 67 | Ht 70.0 in | Wt 197.0 lb

## 2014-04-27 DIAGNOSIS — I10 Essential (primary) hypertension: Secondary | ICD-10-CM

## 2014-04-27 DIAGNOSIS — R079 Chest pain, unspecified: Secondary | ICD-10-CM

## 2014-04-27 NOTE — Progress Notes (Signed)
History of Present Illness: 53 yo male with history of HTN, HLD, GERD, anxiety, depression here today as a new patient for evaluation of chest pain. He has no prior history of heart disease. He describes left sided chest pains. This occurs mostly at exertion but can occur at rest. No associated dyspnea, diaphoresis, N/V.   Primary Care Physician: Sharene Skeans  Last Lipid Profile:Lipid Panel     Component Value Date/Time   CHOL 158 09/16/2013 0935   TRIG 158* 09/16/2013 0935   HDL 46 09/16/2013 0935   CHOLHDL 3.4 09/16/2013 0935   VLDL 32 09/16/2013 0935   [place] 80 09/16/2013 0935   Past Medical History  Diagnosis Date  . Anxiety   . Depression   . GERD (gastroesophageal reflux disease)   . Hyperlipidemia   . Hypertension     Past Surgical History  Procedure Laterality Date  . Appendectomy  1981  . Ankle re construction  2011    bilateral    Current Outpatient Prescriptions  Medication Sig Dispense Refill  . amLODipine (NORVASC) 5 MG tablet Take 1 tablet (5 mg total) by mouth daily.  90 tablet  3  . atorvastatin (LIPITOR) 40 MG tablet Take 1 tablet (40 mg total) by mouth daily.  90 tablet  3  . [facility] ASPIRIN CHILD 81 MG chewable tablet TAKE 1 PILL DAILY TO THIN BLOOD TO HELP TO PREVENT HEART ATTACK AND STROKE  90 tablet  3  . diazepam (VALIUM) 10 MG tablet Take 1 tablet (10 mg total) by mouth 3 (three) times daily.  90 tablet  0  . esomeprazole (NEXIUM) 20 MG capsule Take 20 mg by mouth daily as needed. For acid reflux. Take 1 hour prior to first meal of the day.      . fluticasone (FLONASE) 50 MCG/ACT nasal spray Place 2 sprays into both nostrils daily.  16 g  6  . traMADol (ULTRAM) 50 MG tablet Take 1 tablet (50 mg total) by mouth every 8 (eight) hours as needed.  90 tablet  0  . zolpidem (AMBIEN CR) 12.5 MG CR tablet Take 1 tablet (12.5 mg total) by mouth at bedtime as needed for sleep.  30 tablet  0   No current facility-administered medications for this visit.     No Known Allergies  History   Social History  . Marital Status: Single    Spouse Name: N/A    Number of Children: 1  . Years of Education: N/A   Occupational History  . Engineer    Social History Main Topics  . Smoking status: Never Smoker   . Smokeless tobacco: Never Used  . Alcohol Use: 0.6 oz/week    1 Glasses of wine per week  . Drug Use: No  . Sexual Activity: Not on file   Other Topics Concern  . Not on file   Social History Narrative  . No narrative on file    Family History  Problem Relation Age of Onset  . Esophageal cancer Mother   . Heart disease Maternal Grandfather     MI/CABG    Review of Systems:  As stated in the HPI and otherwise negative.   BP 137/90  Pulse 67  Ht 5\' 10"  (1.778 m)  Wt 197 lb (89.359 kg)  BMI 28.27 kg/m2  Physical Examination: General: Well developed, well nourished, NAD HEENT: OP clear, mucus membranes moist SKIN: warm, dry. No rashes. Neuro: No focal deficits Musculoskeletal: Muscle strength 5/5 all ext Psychiatric:  Mood and affect normal Neck: No JVD, no carotid bruits, no thyromegaly, no lymphadenopathy. Lungs:Clear bilaterally, no wheezes, rhonci, crackles Cardiovascular: Regular rate and rhythm. Soft systolic murmur. No gallops or rubs. Abdomen:Soft. Bowel sounds present. Non-tender.  Extremities: No lower extremity edema. Pulses are 2 + in the bilateral DP/PT.  EKG: NSR, rate 67 bpm.   Assessment and Plan:   1. Chest pain: His pain has typical and atypical features. Risk factors for CAD include HTN, HLD, and FH of CAD in his grandfather. Will arrange exercise treadmill stress test to exclude ischemia and echo to assess LV function, exclude structural heart disease.

## 2014-04-27 NOTE — Patient Instructions (Signed)
Your physician recommends that you schedule a follow-up appointment in:  4-6 weeks.   Your physician has requested that you have an echocardiogram. Echocardiography is a painless test that uses sound waves to create images of your heart. It provides your doctor with information about the size and shape of your heart and how well your heart's chambers and valves are working. This procedure takes approximately one hour. There are no restrictions for this procedure.   Your physician has requested that you have an exercise tolerance test. Can be done with NP or PA or at [facility] office. For further information please visit [email or web address]. Please also follow instruction sheet, as given.

## 2014-05-12 ENCOUNTER — Telehealth (HOSPITAL_COMMUNITY): Payer: Self-pay

## 2014-05-14 ENCOUNTER — Encounter (HOSPITAL_COMMUNITY): Payer: Self-pay

## 2014-05-14 ENCOUNTER — Ambulatory Visit (HOSPITAL_COMMUNITY): Payer: Self-pay

## 2014-05-20 ENCOUNTER — Telehealth: Payer: Self-pay | Admitting: Family Medicine

## 2014-05-20 DIAGNOSIS — M25579 Pain in unspecified ankle and joints of unspecified foot: Secondary | ICD-10-CM

## 2014-05-20 DIAGNOSIS — F411 Generalized anxiety disorder: Secondary | ICD-10-CM

## 2014-05-20 NOTE — Telephone Encounter (Signed)
Jason Williams need refill on his ambien, diazepam, and tramadol.  Please inform when ready for pickup up.  Leaving for out of town tomorrow.  Would need to pickup before end of clinic today.

## 2014-05-21 NOTE — Telephone Encounter (Signed)
Pt asked to be texted because his phone does not get a signal where he works

## 2014-05-22 MED ORDER — DIAZEPAM 10 MG PO TABS: 10.0000 mg | ORAL_TABLET | Freq: Three times a day (TID) | ORAL | Status: DC

## 2014-05-22 MED ORDER — TRAMADOL HCL 50 MG PO TABS: 50.0000 mg | ORAL_TABLET | Freq: Three times a day (TID) | ORAL | Status: DC | PRN

## 2014-05-22 MED ORDER — ZOLPIDEM TARTRATE ER 12.5 MG PO TBCR: 12.5000 mg | EXTENDED_RELEASE_TABLET | Freq: Every evening | ORAL | Status: DC | PRN

## 2014-05-22 NOTE — Telephone Encounter (Signed)
Will write prescription and leave up at desk for pick up after 2:00PM today (6/26). I will have patient reminded that he needs to call one week prior to needing a refill. Next refill due 7/27.

## 2014-05-28 NOTE — Telephone Encounter (Signed)
Encounter complete. 

## 2014-06-05 ENCOUNTER — Ambulatory Visit: Payer: Self-pay | Admitting: Cardiovascular Disease

## 2014-06-22 ENCOUNTER — Telehealth: Payer: Self-pay | Admitting: Family Medicine

## 2014-06-22 DIAGNOSIS — M25579 Pain in unspecified ankle and joints of unspecified foot: Secondary | ICD-10-CM

## 2014-06-22 DIAGNOSIS — F411 Generalized anxiety disorder: Secondary | ICD-10-CM

## 2014-06-22 MED ORDER — DIAZEPAM 10 MG PO TABS: 10.0000 mg | ORAL_TABLET | Freq: Three times a day (TID) | ORAL | Status: DC

## 2014-06-22 MED ORDER — ZOLPIDEM TARTRATE ER 12.5 MG PO TBCR: 12.5000 mg | EXTENDED_RELEASE_TABLET | Freq: Every evening | ORAL | Status: DC | PRN

## 2014-06-22 MED ORDER — TRAMADOL HCL 50 MG PO TABS: 50.0000 mg | ORAL_TABLET | Freq: Three times a day (TID) | ORAL | Status: DC | PRN

## 2014-06-22 NOTE — Telephone Encounter (Signed)
Patient informed.Jason Williams, [name]

## 2014-06-22 NOTE — Telephone Encounter (Signed)
Need refills for Diazepam, Tramadol and Zolpidem

## 2014-06-22 NOTE — Telephone Encounter (Signed)
Medications refilled

## 2014-07-07 ENCOUNTER — Encounter: Payer: Self-pay | Admitting: Family Medicine

## 2014-07-07 ENCOUNTER — Ambulatory Visit (INDEPENDENT_AMBULATORY_CARE_PROVIDER_SITE_OTHER): Payer: BC Managed Care – PPO | Admitting: Family Medicine

## 2014-07-07 VITALS — BP 136/78 | HR 63 | Temp 98.1°F | Wt 196.0 lb

## 2014-07-07 DIAGNOSIS — F411 Generalized anxiety disorder: Secondary | ICD-10-CM

## 2014-07-07 DIAGNOSIS — M25579 Pain in unspecified ankle and joints of unspecified foot: Secondary | ICD-10-CM

## 2014-07-07 DIAGNOSIS — G4700 Insomnia, unspecified: Secondary | ICD-10-CM

## 2014-07-07 MED ORDER — TRAMADOL HCL 50 MG PO TABS: 50.0000 mg | ORAL_TABLET | Freq: Three times a day (TID) | ORAL | Status: DC | PRN

## 2014-07-07 MED ORDER — DIAZEPAM 10 MG PO TABS: 10.0000 mg | ORAL_TABLET | Freq: Three times a day (TID) | ORAL | Status: DC

## 2014-07-07 MED ORDER — DIAZEPAM 10 MG PO TABS
10.0000 mg | ORAL_TABLET | Freq: Three times a day (TID) | ORAL | Status: DC
Start: 2014-07-07 — End: 2014-08-05

## 2014-07-07 MED ORDER — ZOLPIDEM TARTRATE ER 6.25 MG PO TBCR: 6.2500 mg | EXTENDED_RELEASE_TABLET | Freq: Every evening | ORAL | Status: DC | PRN

## 2014-07-07 NOTE — Progress Notes (Signed)
    Subjective  Jason Williams is a 53 y.o. male that presents for a follow-up visit for chronic issues.  1. Hypertension: adherent to medication regimen. Has had some headaches, which are not new. No chest pain or shortness of breath. No leg swelling  2. Insomnia: Ambien too strong. Currently feeling groggy during the day. Would like to go back to previous regimen.  3. Anxiety: Patient at baseline. Adherent to medication regimen.  History  Substance Use Topics  . Smoking status: Never Smoker   . Smokeless tobacco: Never Used  . Alcohol Use: 0.6 oz/week    1 Glasses of wine per week   No Known Allergies  Objective  BP 136/78  Pulse 63  Temp(Src) 98.1 F (36.7 C) (Oral)  Wt 196 lb (88.905 kg)  Gen: well appearing  Assessment and Plan   Please refer to problem based charting of assessment and plan

## 2014-07-07 NOTE — Assessment & Plan Note (Signed)
Patient not tolerating Ambien 12.5mg  CR. Will reduce to Ambien 6.25mg  CR. If patient still does not tolerate, will revert to previous regimen of Ambien 10 immediate release.

## 2014-07-07 NOTE — Patient Instructions (Addendum)
Jason Williams, it was a pleasure seeing you today. Today we talked about your chronic issues. We will change ambien CR to 6.25mg . If this still causes problems still, call me to write a prescription for the immediate release ambien. No changes to the rest of your medication today.  Please see me in 3 months for a complete physical exam. Please call for refills a week before they are due.  If you have any questions or concerns, please do not hesitate to call the office at [phone].  Sincerely,  Cordelia Poche, MD

## 2014-07-07 NOTE — Assessment & Plan Note (Signed)
Continue tramadol 

## 2014-07-07 NOTE — Assessment & Plan Note (Signed)
Continue current regimen

## 2014-07-08 ENCOUNTER — Telehealth: Payer: Self-pay

## 2014-07-08 NOTE — Telephone Encounter (Signed)
[facility] called regarding patient medication refills.  They are unable to approve due to expired DEA of Dr Lonny Prude.  Verbal given to use Dr Ree Kida as prescribing physician.

## 2014-07-20 ENCOUNTER — Telehealth: Payer: Self-pay | Admitting: Family Medicine

## 2014-07-20 NOTE — Telephone Encounter (Signed)
Pt called because the new strength on Ambien is not working and he would like to be put back on the original prescription of Ambien. If possible can we call in a new prescription today since he does not want to take the new strength again. jw

## 2014-07-21 MED ORDER — ZOLPIDEM TARTRATE 10 MG PO TABS: 10.0000 mg | ORAL_TABLET | Freq: Every evening | ORAL | Status: DC | PRN

## 2014-07-21 NOTE — Telephone Encounter (Signed)
Medication printed and up front for patient to pick up. Will D/C CR version.

## 2014-07-21 NOTE — Telephone Encounter (Signed)
Pt called again

## 2014-07-21 NOTE — Telephone Encounter (Signed)
Pt calls again.

## 2014-07-22 NOTE — Telephone Encounter (Signed)
Pt informed

## 2014-08-04 ENCOUNTER — Telehealth: Payer: Self-pay | Admitting: Family Medicine

## 2014-08-04 DIAGNOSIS — F411 Generalized anxiety disorder: Secondary | ICD-10-CM

## 2014-08-04 DIAGNOSIS — M25579 Pain in unspecified ankle and joints of unspecified foot: Secondary | ICD-10-CM

## 2014-08-04 NOTE — Telephone Encounter (Signed)
Forward to PCP for refills.Busick, Robert Lee  

## 2014-08-04 NOTE — Telephone Encounter (Signed)
He is completely out

## 2014-08-04 NOTE — Telephone Encounter (Signed)
Pt called and needs refills left up front on his Ambien, Tramadol, and Valium. Please call when ready for pickup. jw

## 2014-08-05 MED ORDER — DIAZEPAM 10 MG PO TABS: 10.0000 mg | ORAL_TABLET | Freq: Three times a day (TID) | ORAL | Status: DC

## 2014-08-05 MED ORDER — ZOLPIDEM TARTRATE 10 MG PO TABS: 10.0000 mg | ORAL_TABLET | Freq: Every evening | ORAL | Status: DC | PRN

## 2014-08-05 MED ORDER — TRAMADOL HCL 50 MG PO TABS: 50.0000 mg | ORAL_TABLET | Freq: Three times a day (TID) | ORAL | Status: DC | PRN

## 2014-08-05 NOTE — Telephone Encounter (Signed)
Refilled medication this time. This will be the last time I will provide a prescription in this manner. We have discussed at a previous visit the need for him to request these refills a week in advance.

## 2014-08-05 NOTE — Telephone Encounter (Signed)
Called and informed patient that Rxs are ready to pick up at front desk.  Also informed to request refill 1 week in advance per Dr. Lisbeth Ply previous discussion.  Patient verbalized understanding.  Burna Forts, BSN, RN-BC

## 2014-08-05 NOTE — Telephone Encounter (Signed)
PT calls again, is completely out of meds. Please refill asap.

## 2014-09-01 ENCOUNTER — Other Ambulatory Visit: Payer: Self-pay | Admitting: Family Medicine

## 2014-09-01 DIAGNOSIS — F411 Generalized anxiety disorder: Secondary | ICD-10-CM

## 2014-09-01 DIAGNOSIS — M25579 Pain in unspecified ankle and joints of unspecified foot: Secondary | ICD-10-CM

## 2014-09-01 NOTE — Telephone Encounter (Signed)
Needs refills on tramadol, diazepam and Ambien Pt says he was told by dr to call each month for the refills

## 2014-09-03 MED ORDER — DIAZEPAM 10 MG PO TABS: 10.0000 mg | ORAL_TABLET | Freq: Three times a day (TID) | ORAL | Status: DC

## 2014-09-03 MED ORDER — TRAMADOL HCL 50 MG PO TABS: 50.0000 mg | ORAL_TABLET | Freq: Three times a day (TID) | ORAL | Status: DC | PRN

## 2014-09-03 MED ORDER — ZOLPIDEM TARTRATE 10 MG PO TABS: 10.0000 mg | ORAL_TABLET | Freq: Every evening | ORAL | Status: DC | PRN

## 2014-09-03 NOTE — Telephone Encounter (Signed)
LVM for patient to call back. ?

## 2014-09-04 ENCOUNTER — Telehealth: Payer: Self-pay | Admitting: Family Medicine

## 2014-09-04 NOTE — Telephone Encounter (Signed)
LVM for patient to call back. ?

## 2014-09-04 NOTE — Telephone Encounter (Signed)
Pt calling concerning his Lorrin Mais, says rx was only written for 15 day supply, wants to know if MD meant to do that or does MD want him to call again next week for another refill?

## 2014-09-04 NOTE — Telephone Encounter (Signed)
That was a mistake. I meant to write the prescription for 30 days.

## 2014-09-07 NOTE — Telephone Encounter (Signed)
Spoke with patient and informed him of below 

## 2014-09-08 ENCOUNTER — Telehealth: Payer: Self-pay | Admitting: Family Medicine

## 2014-09-08 MED ORDER — ZOLPIDEM TARTRATE 10 MG PO TABS: 10.0000 mg | ORAL_TABLET | Freq: Every evening | ORAL | Status: DC | PRN

## 2014-09-08 NOTE — Telephone Encounter (Signed)
Refilled Ambien to allow for 30 day supply.

## 2014-09-09 NOTE — Telephone Encounter (Signed)
Pt informed. [name], Jason Williams  

## 2014-09-09 NOTE — Telephone Encounter (Signed)
LVM for patient to call back. ?

## 2014-09-22 ENCOUNTER — Telehealth: Payer: Self-pay | Admitting: *Deleted

## 2014-09-22 NOTE — Telephone Encounter (Signed)
Pt called stating he has been having a headache very day this month.  Pt stated he might think it is coming from his blood pressure medication, Norvasc.  Pt has been on Norvasc was some time now.  Pt asked if he is having an aura with his headache; pt denied.  Pt advised he should schedule an appt with his PCP to discuss his medications and headache.  Appt scheduled for 10/05/2014 at 3:45 PM.  Will forward to PCP for further advise.  Please give pt a call. Derl Barrow, RN

## 2014-09-30 ENCOUNTER — Other Ambulatory Visit: Payer: Self-pay | Admitting: Family Medicine

## 2014-10-05 ENCOUNTER — Telehealth: Payer: Self-pay | Admitting: *Deleted

## 2014-10-05 ENCOUNTER — Encounter: Payer: Self-pay | Admitting: Family Medicine

## 2014-10-05 ENCOUNTER — Telehealth: Payer: Self-pay | Admitting: Family Medicine

## 2014-10-05 ENCOUNTER — Ambulatory Visit (INDEPENDENT_AMBULATORY_CARE_PROVIDER_SITE_OTHER): Payer: BC Managed Care – PPO | Admitting: Family Medicine

## 2014-10-05 VITALS — BP 140/75 | HR 77 | Temp 97.7°F | Ht 70.0 in | Wt 198.3 lb

## 2014-10-05 DIAGNOSIS — N529 Male erectile dysfunction, unspecified: Secondary | ICD-10-CM | POA: Diagnosis not present

## 2014-10-05 DIAGNOSIS — Z13220 Encounter for screening for lipoid disorders: Secondary | ICD-10-CM

## 2014-10-05 DIAGNOSIS — Z0000 Encounter for general adult medical examination without abnormal findings: Secondary | ICD-10-CM

## 2014-10-05 DIAGNOSIS — M25579 Pain in unspecified ankle and joints of unspecified foot: Secondary | ICD-10-CM

## 2014-10-05 DIAGNOSIS — F411 Generalized anxiety disorder: Secondary | ICD-10-CM

## 2014-10-05 DIAGNOSIS — Z1329 Encounter for screening for other suspected endocrine disorder: Secondary | ICD-10-CM

## 2014-10-05 DIAGNOSIS — R519 Headache, unspecified: Secondary | ICD-10-CM

## 2014-10-05 DIAGNOSIS — Z125 Encounter for screening for malignant neoplasm of prostate: Secondary | ICD-10-CM

## 2014-10-05 DIAGNOSIS — R51 Headache: Secondary | ICD-10-CM

## 2014-10-05 DIAGNOSIS — Z131 Encounter for screening for diabetes mellitus: Secondary | ICD-10-CM

## 2014-10-05 MED ORDER — ZOLPIDEM TARTRATE 10 MG PO TABS: 10.0000 mg | ORAL_TABLET | Freq: Every evening | ORAL | Status: DC | PRN

## 2014-10-05 MED ORDER — TRAMADOL HCL 50 MG PO TABS: 50.0000 mg | ORAL_TABLET | Freq: Three times a day (TID) | ORAL | Status: DC | PRN

## 2014-10-05 MED ORDER — DIAZEPAM 10 MG PO TABS: 10.0000 mg | ORAL_TABLET | Freq: Three times a day (TID) | ORAL | Status: DC

## 2014-10-05 MED ORDER — NORTRIPTYLINE HCL 10 MG PO CAPS: 10.0000 mg | ORAL_CAPSULE | Freq: Every day | ORAL | Status: DC

## 2014-10-05 NOTE — Telephone Encounter (Signed)
Patient seen today. Refill given to patient.

## 2014-10-05 NOTE — Progress Notes (Signed)
  Patient ID: Jason Williams, male   DOB: 1961/02/10, 53 y.o.   MRN: [id number]    Subjective    Jason Williams is a 53 y.o. male that presents for a preventive office visit  Concerns:  1. Headache: he has intermittent headaches but has had a worsened headache since 2 months ago. Around that time, he remembers hitting his head on some machinery when attempting to stand up. Pain is a burning pain that radiates from right base, across the top of his head and to the back of his eye. The pain at the back of his eye is throbbing in nature. No change in vision, no weakness, no paraesthesia.  2. Erectile dysfunction: chronic issue. Patient was on Viagra before which worked well for him.  Past Medical History  Diagnosis Date  . Anxiety   . Depression   . GERD (gastroesophageal reflux disease)   . Hyperlipidemia   . Hypertension    Past Surgical History  Procedure Laterality Date  . Appendectomy  1981  . Ankle re construction  2011    bilateral   Family History  Problem Relation Age of Onset  . Esophageal cancer Mother   . Heart disease Maternal Grandfather     MI/CABG     History  Substance Use Topics  . Smoking status: Never Smoker   . Smokeless tobacco: Never Used  . Alcohol Use: 0.6 oz/week    1 Glasses of wine per week    No Known Allergies  No orders of the defined types were placed in this encounter.    ROS  Per HPI   Objective   BP 140/75 mmHg  Pulse 77  Temp(Src) 97.7 F (36.5 C) (Oral)  Ht 5\' 10"  (1.778 m)  Wt 198 lb 4.8 oz (89.948 kg)  BMI 28.45 kg/m2  General: Well appearing, in no distress HEENT: No cervical bony tenderness. No tenderness along neck or scalp. PERRLA, EOMI, no cervical adenopathy, oropharynx clear and moist. Respiratory/Chest: Clear to auscultation bilaterally Cardiovascular: Regular rate and rhythm, no murmur Gastrointestinal: Soft, non-tender, non-distended, normal bowel sounds Genitourinary: Not examined. Patient requests PSA  instead of DRE   Musculoskeletal: No edema Neuro: Alert, oriented, no CN deficits  Assessment and Plan    53 y.o. male with medical conditions listed in his problem list.  - CBC, CMET, Lipid panel, PSA, TSH, Hemoglobin A1C  Please refer to problem based charting of assessment and plan

## 2014-10-05 NOTE — Telephone Encounter (Signed)
Spoke with patient and informed him of below 

## 2014-10-05 NOTE — Patient Instructions (Addendum)
Serotonin Syndrome Serotonin is a brain chemical that regulates the nervous system. Some kinds of drugs increase the amount of serotonin in your body. Drugs that increase the serotonin in your body include:   Anti-depressant medications.  St. John's wort.  Recreational drugs.  Migraine medicines.  Some pain medicines. SYMPTOMS Combining these drugs increases the risk that you will become ill with a toxic condition called serotonin syndrome.  Symptoms of too much serotonin include:  Confusion.  Agitation.  Weakness.  Insomnia.  Fever.  Sweats. Other symptoms that may develop include:  Shakiness.  Muscle spasms.  Seizures. TREATMENT  Hospital treatment is often needed until the effects are controlled.  Avoiding the combination of medicines listed above is recommended.  Check with your doctor if you are concerned about your medicine or the side effects. Document Released: 12/21/2004 Document Revised: 02/05/2012 Document Reviewed: 11/13/2005 [facility] Patient Information 2015 [place], [place]. This information is not intended to replace advice given to you by your health care provider. Make sure you discuss any questions you have with your health care provider.   Health Maintenance A healthy lifestyle and preventative care can promote health and wellness.  Maintain regular health, dental, and eye exams.  Eat a healthy diet. Foods like vegetables, fruits, whole grains, low-fat dairy products, and lean protein foods contain the nutrients you need and are low in calories. Decrease your intake of foods high in solid fats, added sugars, and salt. Get information about a proper diet from your health care provider, if necessary.  Regular physical exercise is one of the most important things you can do for your health. Most adults should get at least 150 minutes of moderate-intensity exercise (any activity that increases your heart rate and causes you to sweat) each week. In  addition, most adults need muscle-strengthening exercises on 2 or more days a week.   Maintain a healthy weight. The body mass index (BMI) is a screening tool to identify possible weight problems. It provides an estimate of body fat based on height and weight. Your health care provider can find your BMI and can help you achieve or maintain a healthy weight. For males 20 years and older:  A BMI below 18.5 is considered underweight.  A BMI of 18.5 to 24.9 is normal.  A BMI of 25 to 29.9 is considered overweight.  A BMI of 30 and above is considered obese.  Maintain normal blood lipids and cholesterol by exercising and minimizing your intake of saturated fat. Eat a balanced diet with plenty of fruits and vegetables. Blood tests for lipids and cholesterol should begin at age 38 and be repeated every 5 years. If your lipid or cholesterol levels are high, you are over age 64, or you are at high risk for heart disease, you may need your cholesterol levels checked more frequently.Ongoing high lipid and cholesterol levels should be treated with medicines if diet and exercise are not working.  If you smoke, find out from your health care provider how to quit. If you do not use tobacco, do not start.  Lung cancer screening is recommended for adults aged 80-80 years who are at high risk for developing lung cancer because of a history of smoking. A yearly low-dose CT scan of the lungs is recommended for people who have at least a 30-pack-year history of smoking and are current smokers or have quit within the past 15 years. A pack year of smoking is smoking an average of 1 pack of cigarettes a day  for 1 year (for example, a 30-pack-year history of smoking could mean smoking 1 pack a day for 30 years or 2 packs a day for 15 years). Yearly screening should continue until the smoker has stopped smoking for at least 15 years. Yearly screening should be stopped for people who develop a health problem that would  prevent them from having lung cancer treatment.  If you choose to drink alcohol, do not have more than 2 drinks per day. One drink is considered to be 12 oz (360 mL) of beer, 5 oz (150 mL) of wine, or 1.5 oz (45 mL) of liquor.  Avoid the use of street drugs. Do not share needles with anyone. Ask for help if you need support or instructions about stopping the use of drugs.  High blood pressure causes heart disease and increases the risk of stroke. Blood pressure should be checked at least every 1-2 years. Ongoing high blood pressure should be treated with medicines if weight loss and exercise are not effective.  If you are 58-63 years old, ask your health care provider if you should take aspirin to prevent heart disease.  Diabetes screening involves taking a blood sample to check your fasting blood sugar level. This should be done once every 3 years after age 47 if you are at a normal weight and without risk factors for diabetes. Testing should be considered at a younger age or be carried out more frequently if you are overweight and have at least 1 risk factor for diabetes.  Colorectal cancer can be detected and often prevented. Most routine colorectal cancer screening begins at the age of 17 and continues through age 58. However, your health care provider may recommend screening at an earlier age if you have risk factors for colon cancer. On a yearly basis, your health care provider may provide home test kits to check for hidden blood in the stool. A small camera at the end of a tube may be used to directly examine the colon (sigmoidoscopy or colonoscopy) to detect the earliest forms of colorectal cancer. Talk to your health care provider about this at age 39 when routine screening begins. A direct exam of the colon should be repeated every 5-10 years through age 51, unless early forms of precancerous polyps or small growths are found.  People who are at an increased risk for hepatitis B should be  screened for this virus. You are considered at high risk for hepatitis B if:  You were born in a country where hepatitis B occurs often. Talk with your health care provider about which countries are considered high risk.  Your parents were born in a high-risk country and you have not received a shot to protect against hepatitis B (hepatitis B vaccine).  You have HIV or AIDS.  You use needles to inject street drugs.  You live with, or have sex with, someone who has hepatitis B.  You are a man who has sex with other men (MSM).  You get hemodialysis treatment.  You take certain medicines for conditions like cancer, organ transplantation, and autoimmune conditions.  Hepatitis C blood testing is recommended for all people born from [birth year] through 1965 and any individual with known risk factors for hepatitis C.  Healthy men should no longer receive prostate-specific antigen (PSA) blood tests as part of routine cancer screening. Talk to your health care provider about prostate cancer screening.  Testicular cancer screening is not recommended for adolescents or adult males who have no symptoms.  Screening includes self-exam, a health care provider exam, and other screening tests. Consult with your health care provider about any symptoms you have or any concerns you have about testicular cancer.  Practice safe sex. Use condoms and avoid high-risk sexual practices to reduce the spread of sexually transmitted infections (STIs).  You should be screened for STIs, including gonorrhea and chlamydia if:  You are sexually active and are younger than 24 years.  You are older than 24 years, and your health care provider tells you that you are at risk for this type of infection.  Your sexual activity has changed since you were last screened, and you are at an increased risk for chlamydia or gonorrhea. Ask your health care provider if you are at risk.  If you are at risk of being infected with HIV, it is  recommended that you take a prescription medicine daily to prevent HIV infection. This is called pre-exposure prophylaxis (PrEP). You are considered at risk if:  You are a man who has sex with other men (MSM).  You are a heterosexual man who is sexually active with multiple partners.  You take drugs by injection.  You are sexually active with a partner who has HIV.  Talk with your health care provider about whether you are at high risk of being infected with HIV. If you choose to begin PrEP, you should first be tested for HIV. You should then be tested every 3 months for as long as you are taking PrEP.  Use sunscreen. Apply sunscreen liberally and repeatedly throughout the day. You should seek shade when your shadow is shorter than you. Protect yourself by wearing long sleeves, pants, a wide-brimmed hat, and sunglasses year round whenever you are outdoors.  Tell your health care provider of new moles or changes in moles, especially if there is a change in shape or color. Also, tell your health care provider if a mole is larger than the size of a pencil eraser.  A one-time screening for abdominal aortic aneurysm (AAA) and surgical repair of large AAAs by ultrasound is recommended for men aged 78-75 years who are current or former smokers.  Stay current with your vaccines (immunizations). Document Released: 05/11/2008 Document Revised: 11/18/2013 Document Reviewed: 04/10/2011 [facility] Patient Information 2015 [place], [place]. This information is not intended to replace advice given to you by your health care provider. Make sure you discuss any questions you have with your health care provider.

## 2014-10-05 NOTE — Telephone Encounter (Signed)
Pt needs a refill for Tramadol 50 mg tablet.  Derl Barrow, RN

## 2014-10-05 NOTE — Telephone Encounter (Signed)
Pt calls, has appt with Dr. Lonny Prude today at 3:45pm. States that he was suppose to be getting labwork done, no future labs ordered. Pt is requesting labs to be ordered so he can get them drawn before his appt today. Please call patient if this is possible.

## 2014-10-06 ENCOUNTER — Other Ambulatory Visit: Payer: Self-pay | Admitting: Family Medicine

## 2014-10-06 ENCOUNTER — Telehealth: Payer: Self-pay | Admitting: Family Medicine

## 2014-10-06 DIAGNOSIS — N529 Male erectile dysfunction, unspecified: Secondary | ICD-10-CM

## 2014-10-06 NOTE — Telephone Encounter (Signed)
Spoke with Mickel Baas at [facility] and she stated that they have fixed the issue

## 2014-10-06 NOTE — Telephone Encounter (Signed)
At visit yesterday, dr Lonny Prude did not call in prescription for Viagra. He says the bigger pill is cheaper for him

## 2014-10-06 NOTE — Telephone Encounter (Signed)
Patient requests to provide Doctor's DEA number in order to get his prescriptions at [facility] on [address].

## 2014-10-07 DIAGNOSIS — R519 Headache, unspecified: Secondary | ICD-10-CM | POA: Insufficient documentation

## 2014-10-07 DIAGNOSIS — R51 Headache: Secondary | ICD-10-CM

## 2014-10-07 DIAGNOSIS — N529 Male erectile dysfunction, unspecified: Secondary | ICD-10-CM | POA: Insufficient documentation

## 2014-10-07 MED ORDER — SILDENAFIL CITRATE 100 MG PO TABS: 100.0000 mg | ORAL_TABLET | Freq: Every day | ORAL | Status: DC | PRN

## 2014-10-07 NOTE — Telephone Encounter (Signed)
Spoke with patient and informed him of below 

## 2014-10-07 NOTE — Assessment & Plan Note (Signed)
Restart Viagra 100mg  PRN

## 2014-10-07 NOTE — Assessment & Plan Note (Signed)
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 No neurologic sequela. Will treat with with nortriptyline 10mg  qhs.

## 2014-10-07 NOTE — Telephone Encounter (Signed)
Medication sent to pharmacy  

## 2014-12-31 ENCOUNTER — Other Ambulatory Visit: Payer: Self-pay | Admitting: Family Medicine

## 2014-12-31 DIAGNOSIS — M25579 Pain in unspecified ankle and joints of unspecified foot: Secondary | ICD-10-CM

## 2014-12-31 DIAGNOSIS — F411 Generalized anxiety disorder: Secondary | ICD-10-CM

## 2014-12-31 NOTE — Telephone Encounter (Signed)
Pt called and needs refills on his Tramadol, Valium, and Ambien. He will be out on the 9th of February. jw

## 2015-01-05 ENCOUNTER — Ambulatory Visit (INDEPENDENT_AMBULATORY_CARE_PROVIDER_SITE_OTHER): Payer: BLUE CROSS/BLUE SHIELD | Admitting: Family Medicine

## 2015-01-05 ENCOUNTER — Encounter: Payer: Self-pay | Admitting: Family Medicine

## 2015-01-05 VITALS — BP 134/80 | HR 85 | Temp 98.2°F | Ht 70.0 in | Wt 196.5 lb

## 2015-01-05 DIAGNOSIS — Z23 Encounter for immunization: Secondary | ICD-10-CM | POA: Diagnosis not present

## 2015-01-05 DIAGNOSIS — F411 Generalized anxiety disorder: Secondary | ICD-10-CM | POA: Diagnosis not present

## 2015-01-05 DIAGNOSIS — E785 Hyperlipidemia, unspecified: Secondary | ICD-10-CM

## 2015-01-05 DIAGNOSIS — M25579 Pain in unspecified ankle and joints of unspecified foot: Secondary | ICD-10-CM | POA: Diagnosis not present

## 2015-01-05 DIAGNOSIS — G4700 Insomnia, unspecified: Secondary | ICD-10-CM

## 2015-01-05 DIAGNOSIS — I10 Essential (primary) hypertension: Secondary | ICD-10-CM | POA: Diagnosis not present

## 2015-01-05 MED ORDER — DIAZEPAM 10 MG PO TABS: 10.0000 mg | ORAL_TABLET | Freq: Three times a day (TID) | ORAL | Status: DC

## 2015-01-05 MED ORDER — ZOLPIDEM TARTRATE 10 MG PO TABS: 10.0000 mg | ORAL_TABLET | Freq: Every evening | ORAL | Status: DC | PRN

## 2015-01-05 MED ORDER — ATORVASTATIN CALCIUM 20 MG PO TABS: 20.0000 mg | ORAL_TABLET | Freq: Every day | ORAL | Status: DC

## 2015-01-05 MED ORDER — TRAMADOL HCL 50 MG PO TABS: 50.0000 mg | ORAL_TABLET | Freq: Three times a day (TID) | ORAL | Status: DC | PRN

## 2015-01-05 NOTE — Assessment & Plan Note (Signed)
Symptoms controlled on diazepam  Refill diazepam 10mg  TID #90 x2 refills.

## 2015-01-05 NOTE — Patient Instructions (Signed)
Thank you for coming to see me today. It was a pleasure. Today we talked about:   Anxiety: no changes. Diazepam refilled  Insomnia: no changes. Ambien refilled  Ankle pain: no changes. Tramadol refilled  Hypertension: Blood pressure at goal. No changes.  Myalgias: We will cut your atorvastatin to 20mg  daily to see if you can tolerate this  Please make an appointment to see me in 3 months for follow-up. Also, please make an appointment to get your lab work done.  If you have any questions or concerns, please do not hesitate to call the office at [phone].  Sincerely,  Cordelia Poche, MD

## 2015-01-05 NOTE — Assessment & Plan Note (Signed)
Patient satisfied with sleeping. Does not want to come off of Ambien. Although he is waking up throughout the night, he does not wish to restart Ambien CR  Refill Ambien 10mg  qHS #30 x2 refills

## 2015-01-05 NOTE — Progress Notes (Signed)
    Subjective    Jason Williams is a 54 y.o. male that presents for an office visit.   1. Anxiety: Adherent with medication regimen. No complaints.  2. Chronic ankle pain: Adherent with medication regimen.   3. Insomnia: Adherent with regimen. Wakes up about 2 to 3 hours per night but is able to go back to sleep. He gets about 6 hours of sleep per night. He is not interested in restarting Ambien CR.  4. Myalgia/Joint pain: Pain in elbows, knees, neck and hip area that resolved about 3 weeks ago. He does not have any recent history of trauma. No gross hematuria noted. He noticed that stopping atorvastatin resolved the pain.  History  Substance Use Topics  . Smoking status: Never Smoker   . Smokeless tobacco: Never Used  . Alcohol Use: 1.2 oz/week    2 Glasses of wine per week    No Known Allergies  No orders of the defined types were placed in this encounter.    ROS  Per HPI   Objective   BP 134/80 mmHg  Pulse 85  Temp(Src) 98.2 F (36.8 C) (Oral)  Ht 5\' 10"  (1.778 m)  Wt 196 lb 8 oz (89.132 kg)  BMI 28.19 kg/m2  General: Well appearing  Assessment and Plan   Please refer to problem based charting of assessment and plan

## 2015-01-05 NOTE — Assessment & Plan Note (Signed)
Pain managed with Tramadol. No side effects. No trouble with ambulation  Refill Tramadol 50mg  q8hrs PRN #90 x2 refills

## 2015-01-05 NOTE — Assessment & Plan Note (Signed)
Patient with myalgias and arthralgias that have improved after stopping statin therapy. Possibly related. No current symptoms  Decrease to atorvastatin 20mg  daily  Instructed that he may discontinue if symptoms return  He may start taking omega-3 fatty acids instead

## 2015-01-25 ENCOUNTER — Encounter: Payer: Self-pay | Admitting: Family Medicine

## 2015-01-25 ENCOUNTER — Ambulatory Visit (INDEPENDENT_AMBULATORY_CARE_PROVIDER_SITE_OTHER): Payer: BLUE CROSS/BLUE SHIELD | Admitting: Family Medicine

## 2015-01-25 VITALS — BP 118/77 | HR 79 | Temp 98.0°F | Wt 198.6 lb

## 2015-01-25 DIAGNOSIS — Z13220 Encounter for screening for lipoid disorders: Secondary | ICD-10-CM

## 2015-01-25 DIAGNOSIS — Z0000 Encounter for general adult medical examination without abnormal findings: Secondary | ICD-10-CM

## 2015-01-25 DIAGNOSIS — Z125 Encounter for screening for malignant neoplasm of prostate: Secondary | ICD-10-CM

## 2015-01-25 DIAGNOSIS — Z131 Encounter for screening for diabetes mellitus: Secondary | ICD-10-CM | POA: Diagnosis not present

## 2015-01-25 DIAGNOSIS — Z1329 Encounter for screening for other suspected endocrine disorder: Secondary | ICD-10-CM

## 2015-01-25 DIAGNOSIS — J209 Acute bronchitis, unspecified: Secondary | ICD-10-CM

## 2015-01-25 LAB — COMPREHENSIVE METABOLIC PANEL WITH GFR
ALT: 26 U/L (ref 0–53)
AST: 17 U/L (ref 0–37)
Albumin: 4.1 g/dL (ref 3.5–5.2)
Alkaline Phosphatase: 59 U/L (ref 39–117)
BUN: 11 mg/dL (ref 6–23)
CO2: 24 meq/L (ref 19–32)
Calcium: 9.7 mg/dL (ref 8.4–10.5)
Chloride: 104 meq/L (ref 96–112)
Creat: 0.95 mg/dL (ref 0.50–1.35)
Glucose, Bld: 99 mg/dL (ref 70–99)
Potassium: 4.6 meq/L (ref 3.5–5.3)
Sodium: 139 meq/L (ref 135–145)
Total Bilirubin: 0.4 mg/dL (ref 0.2–1.2)
Total Protein: 6.7 g/dL (ref 6.0–8.3)

## 2015-01-25 LAB — CBC WITH DIFFERENTIAL/PLATELET
BASOS ABS: 0 10*3/uL (ref 0.0–0.1)
BASOS PCT: 0 % (ref 0–1)
EOS PCT: 2 % (ref 0–5)
Eosinophils Absolute: 0.1 10*3/uL (ref 0.0–0.7)
HEMATOCRIT: 42.9 % (ref 39.0–52.0)
HEMOGLOBIN: 14.6 g/dL (ref 13.0–17.0)
LYMPHS ABS: 1.5 10*3/uL (ref 0.7–4.0)
Lymphocytes Relative: 24 % (ref 12–46)
MCH: 30.7 pg (ref 26.0–34.0)
MCHC: 34 g/dL (ref 30.0–36.0)
MCV: 90.1 fL (ref 78.0–100.0)
MONO ABS: 0.5 10*3/uL (ref 0.1–1.0)
MPV: 9.2 fL (ref 8.6–12.4)
Monocytes Relative: 8 % (ref 3–12)
Neutro Abs: 4.1 10*3/uL (ref 1.7–7.7)
Neutrophils Relative %: 66 % (ref 43–77)
Platelets: 241 10*3/uL (ref 150–400)
RBC: 4.76 MIL/uL (ref 4.22–5.81)
RDW: 13.6 % (ref 11.5–15.5)
WBC: 6.2 10*3/uL (ref 4.0–10.5)

## 2015-01-25 LAB — LIPID PANEL
CHOL/HDL RATIO: 6 ratio
CHOLESTEROL: 205 mg/dL — AB (ref 0–200)
HDL: 34 mg/dL — AB (ref >=40)
LDL Cholesterol: 142 mg/dL — ABNORMAL HIGH (ref 0–99)
TRIGLYCERIDES: 143 mg/dL (ref <150)
VLDL: 29 mg/dL (ref 0–40)

## 2015-01-25 LAB — TSH: TSH: 2.248 u[IU]/mL (ref 0.350–4.500)

## 2015-01-25 LAB — POCT GLYCOSYLATED HEMOGLOBIN (HGB A1C): Hemoglobin A1C: 5.3

## 2015-01-25 MED ORDER — AZITHROMYCIN 250 MG PO TABS: ORAL_TABLET | ORAL | Status: DC

## 2015-01-25 NOTE — Assessment & Plan Note (Signed)
2 weeks productive heavy mucous. No red flags. Will treat empirically with azithromycin.

## 2015-01-25 NOTE — Patient Instructions (Signed)
Azithromycin antibiotic: take 2 tablets on day 1, then 1 tablet days 2-5

## 2015-01-25 NOTE — Progress Notes (Signed)
   Subjective:    Patient ID: Jason Williams, male    DOB: Apr 14, 1961, 54 y.o.   MRN: [id number]  HPI  Patient presents for Same Day Appointment  CC: Cough  # Cough:  Started 2 weeks ago with sore throat  Productive of thick green phlegm/sputum, primarily in the morning but also throughout the day. Noted to have some small streaks of blood more recently  Associated runny nose/congestion, sore throat, left ear pain  Denies any fevers/chills  Taking nyquil and other OTC cough/cold without much relief  Felt like he was getting better recently but then got worse. Feels a little better today.  Denies smoking ROS: No SOB, mild CP with cough only, no changes in weight.  Review of Systems   See HPI for ROS. All other systems reviewed and are negative.  Past medical history, surgical, family, and social history reviewed and updated in the EMR as appropriate.  Objective:  BP 118/77 mmHg  Pulse 79  Temp(Src) 98 F (36.7 C) (Oral)  Wt 198 lb 9 oz (90.067 kg) Vitals and nursing note reviewed  General: NAD HEENT: PERRL, EOMI. Nares without discharge. Oropharynx without exudate. TMs pearly gray bilaterally CV: RRR, normal s1 and s2, no m/r/g Resp: clear bilaterally  Assessment & Plan:  See Problem List Documentation

## 2015-01-26 ENCOUNTER — Encounter: Payer: Self-pay | Admitting: Family Medicine

## 2015-01-26 LAB — PSA: PSA: 2.35 ng/mL (ref <=4.00)

## 2015-02-10 ENCOUNTER — Telehealth: Payer: Self-pay | Admitting: Family Medicine

## 2015-02-10 NOTE — Telephone Encounter (Signed)
Has fluid on right knee Can dr Lonny Prude remove it?

## 2015-02-12 NOTE — Telephone Encounter (Signed)
This is possible at our office. He can make an appointment to see me. He can also make an appointment to be seen as a "Same day" so he can be seen sooner if needed.

## 2015-02-15 NOTE — Telephone Encounter (Signed)
Spoke with patient and appointment made for same day clinic with Dr. Lonny Prude on 3/24 @ 9am

## 2015-02-18 ENCOUNTER — Ambulatory Visit: Payer: Self-pay | Admitting: Family Medicine

## 2015-02-22 ENCOUNTER — Ambulatory Visit: Payer: Self-pay | Admitting: Family Medicine

## 2015-03-03 ENCOUNTER — Other Ambulatory Visit: Payer: Self-pay | Admitting: Family Medicine

## 2015-03-03 NOTE — Telephone Encounter (Signed)
Pt called and needs a refill on his Nexium called in. jw

## 2015-03-05 NOTE — Telephone Encounter (Signed)
Spoke with patient and informed him of below, patient states that he is leaving for [place] and will not be able to come in and discuss.

## 2015-03-05 NOTE — Telephone Encounter (Signed)
Patient has not been prescribed this for over 3 years. I don't believe we've ever discussed any symptoms requiring the use of a proton pump inhibitor. If he is having issues with GERD, he should follow-up with me and/or use OTC medications and avoid food that aggravate symptoms. General reflux is not an indication for PPIs.

## 2015-03-05 NOTE — Telephone Encounter (Signed)
Pt called and was checking the status of his refill request from 2 days ago. jw

## 2015-03-16 ENCOUNTER — Other Ambulatory Visit: Payer: Self-pay | Admitting: *Deleted

## 2015-03-16 MED ORDER — ATORVASTATIN CALCIUM 20 MG PO TABS: 20.0000 mg | ORAL_TABLET | Freq: Every day | ORAL | Status: DC

## 2015-03-16 NOTE — Telephone Encounter (Signed)
Request is for a 90 day supply. Ade Stmarie L, RN  

## 2015-03-18 ENCOUNTER — Ambulatory Visit (INDEPENDENT_AMBULATORY_CARE_PROVIDER_SITE_OTHER): Payer: BLUE CROSS/BLUE SHIELD | Admitting: Family Medicine

## 2015-03-18 ENCOUNTER — Encounter: Payer: Self-pay | Admitting: Family Medicine

## 2015-03-18 VITALS — BP 140/86 | HR 70 | Temp 98.1°F | Ht 70.0 in | Wt 202.2 lb

## 2015-03-18 DIAGNOSIS — K219 Gastro-esophageal reflux disease without esophagitis: Secondary | ICD-10-CM | POA: Diagnosis not present

## 2015-03-18 DIAGNOSIS — M7041 Prepatellar bursitis, right knee: Secondary | ICD-10-CM | POA: Diagnosis not present

## 2015-03-18 MED ORDER — ESOMEPRAZOLE MAGNESIUM 20 MG PO CPDR: 20.0000 mg | DELAYED_RELEASE_CAPSULE | Freq: Every day | ORAL | Status: DC | PRN

## 2015-03-18 NOTE — Progress Notes (Signed)
   Subjective:    Patient ID: Jason Williams, male    DOB: [birth year], 54 y.o.   MRN: [id number]  Patient presents for a same day appointment.  HPI  PREPATELLAR BURSITIS, RIGHT KNEE: - Reports no prior history of any knee problems. No prior knee injuries, accident or trauma. No surgeries or injections previously. - Currently reports that Right Knee developed gradual swelling about 2 months ago, described as swelling directly over kneecap (prepatellar) without redness, warmth, or pain. Initially had significant amount of swelling at most was "as large as an egg", but did not seem to limit his range of motion, walking, or activities, mainly uncomfortable due to swelling. He was unable to follow-up sooner, and now states swelling has significantly improved, still present but improved over past 2 weeks. Additionally, recently returned from business trip to [place] for about 10 days (without significant activity on trip) - Taking Tramadol 50mg  TID PRN (usually takes every day), for chronic ankle pain s/p reconstructive surgery - Has not tried any ice or heat, does not wear any knee sleeve - Active with walking, jogging/running on treadmill 2-3x weekly, plays golf 2-3x week - Denies any fevers/chills, swelling within main part of knee, left knee swelling, tenderness or pain within joints, rash  GERD: - Reports history of prior acid reflux, worse with certain trigger foods. - Previously on Nexium 20mg  daily (however was not using daily), which seemed to control his symptoms. He has been out for a while - Currently reports increasing epigastric abdominal burning and discomfort. Previously advised to improve dietary choices and reduce trigger foods, without relief - Denies any nausea / vomiting, diarrhea, constipation, abd bloating, cough  I have reviewed and updated the following as appropriate: allergies and current medications  Social Hx: - Never smoker  Review of Systems  See above HPI      Objective:   Physical Exam  BP 140/86 mmHg  Pulse 70  Temp(Src) 98.1 F (36.7 C) (Oral)  Ht 5\' 10"  (1.778 m)  Wt 202 lb 3.2 oz (91.717 kg)  BMI 29.01 kg/m2  Gen - well-appearing, NAD HEENT- MMM Abd - soft, mild epigastric tenderness, otherwise non-tender, non-distended, no rebound, no masses, +active BS MSK - Right Knee - significant localized prepatellar swelling about 3x3 cm limited to anterior knee without any evidence of knee joint effusion. Soft and fluctuant to palpation. Non-tender, no erythema or warmth. Full active ROM knee flex / ext. Knee joint line non-tender to palpation, ligaments intact. Left Knee - normal appearance, without effusion, no erythema or tenderness, full ROM. Ext - non-tender, no edema, peripheral pulses intact +2 b/l Skin - warm, dry, no rashes  Procedure: RIGHT KNEE, PREPATELLAR BURSA ASPIRATION Consent signed and scanned into record. Medication:  None (opted to do without Lidocaine) Time Out taken  Landmarks identified, Right anterior knee area cleansed with betadine swabs and wiped with alcohol swabs.Cold spray followed by insertion of 21 1/2 gauge needle (10 cc syringe) to aspirate normal appearing clear yellow fluid about 7.5 cc.Sterile bandage placed and ACE wrap used for pressure.Patient tolerated procedure well without bleeding or paresthesias.No complications.     Assessment & Plan:   See specific A&P problem list for details.

## 2015-03-18 NOTE — Patient Instructions (Signed)
Dear Jason Williams, Thank you for coming in to clinic today. It was good to see you!  1. You have Prepatellar Bursitis in your Right Knee - this is a common problem, usually caused by underlying arthritis or irritation to your prepatellar burs - There is no sign of infection or other concerns 3. Drained bursa successfully today - clear normal fluid. May have mild bleeding, and some pain (take medicine as needed) leave ace wrap on for 24 hours - Try wearing a knee sleeve in future to prevent reoccurrences   Refilled Nexium 20mg  daily  Please schedule a follow-up appointment with Dr. Lonny Prude as needed  If you have any other questions or concerns, please feel free to call the clinic to contact me. You may also schedule an earlier appointment if necessary.  However, if your symptoms get significantly worse, please go to the Emergency Department to seek immediate medical attention.  Nobie Putnam, DO [facility]   Prepatellar Bursitis with Rehab  Bursitis is a condition that is characterized by inflammation of a bursa. Saunders Revel exists in many areas of the body. They are fluid-filled sacs that lie between a soft tissue (skin, tendon, or ligament) and a bone, and they reduce friction between the structures as well as the stress placed on the soft tissue. Prepatellar bursitis is inflammation of the bursa that lies between the skin and the kneecap (patella). This condition often causes pain over the patella. SYMPTOMS   Pain, tenderness, and/or inflammation over the patella.  Pain that worsens with movement of the knee joint.  Decreased range of motion for the knee joint.  A crackling sound (crepitation) when the bursa is moved or touched.  Occasionally, painless swelling of the bursa.  Fever (when infected). CAUSES  Bursitis is caused by damage to the bursa, which results in an inflammatory response. Common mechanisms of injury include:  Direct trauma to the front of  the knee.  Repetitive and/or stressful use of the knee. RISK INCREASES WITH:  Activities in which kneeling and/or falling on one's knees is likely (volleyball or football).  Repetitive and stressful training, especially if it involves running on hills.  Improper training techniques, such as a sudden increase in the intensity, frequency, or duration of training.  Failure to warm up properly before activity.  Poor technique.  Artificial turf. PREVENTION   Avoid kneeling or falling on your knees.  Warm up and stretch properly before activity.  Allow for adequate recovery between workouts.  Maintain physical fitness:  Strength, flexibility, and endurance.  Cardiovascular fitness.  Learn and use proper technique. When possible, have a coach correct improper technique.  Wear properly fitted and padded protective equipment (kneepads). PROGNOSIS  If treated properly, then the symptoms of prepatellar bursitis usually resolve within 2 weeks. RELATED COMPLICATIONS   Recurrent symptoms that result in a chronic problem.  Prolonged healing time, if improperly treated or reinjured.  Limited range of motion.  Infection of bursa.  Chronic inflammation or scarring of bursa. TREATMENT  Treatment initially involves the use of ice and medication to help reduce pain and inflammation. The use of strengthening and stretching exercises may help reduce pain with activity, especially those of the quadriceps and hamstring muscles. These exercises may be performed at home or with referral to a therapist. Your caregiver may recommend kneepads when you return to playing sports, in order to reduce the stress on the prepatellar bursa. If symptoms persist despite treatment, then your caregiver may drain fluid out with a  needle (aspirate) the bursa. If symptoms persist for greater than 6 months despite nonsurgical (conservative) treatment, then surgery may be recommended to remove the bursa.   MEDICATION  If pain medication is necessary, then nonsteroidal anti-inflammatory medications, such as aspirin and ibuprofen, or other minor pain relievers, such as acetaminophen, are often recommended.  Do not take pain medication for 7 days before surgery.  Prescription pain relievers may be given if deemed necessary by your caregiver. Use only as directed and only as much as you need.  Corticosteroid injections may be given by your caregiver. These injections should be reserved for the most serious cases, because they may only be given a certain number of times. HEAT AND COLD  Cold treatment (icing) relieves pain and reduces inflammation. Cold treatment should be applied for 10 to 15 minutes every 2 to 3 hours for inflammation and pain and immediately after any activity that aggravates your symptoms. Use ice packs or massage the area with a piece of ice (ice massage).  Heat treatment may be used prior to performing the stretching and strengthening activities prescribed by your caregiver, physical therapist, or athletic trainer. Use a heat pack or soak the injury in warm water. SEEK MEDICAL CARE IF:  Treatment seems to offer no benefit, or the condition worsens.  Any medications produce adverse side effects. EXERCISES RANGE OF MOTION (ROM) AND STRETCHING EXERCISES - Prepatellar Bursitis These exercises may help you when beginning to rehabilitate your injury. Your symptoms may resolve with or without further involvement from your physician, physical therapist or athletic trainer. While completing these exercises, remember:   Restoring tissue flexibility helps normal motion to return to the joints. This allows healthier, less painful movement and activity.  An effective stretch should be held for at least 30 seconds.  A stretch should never be painful. You should only feel a gentle lengthening or release in the stretched tissue. STRETCH - Hamstrings, Standing  Stand or sit and extend  your right / left leg, placing your foot on a chair or foot stool  Keeping a slight arch in your low back and your hips straight forward.  Lead with your chest and lean forward at the waist until you feel a gentle stretch in the back of your right / left knee or thigh. (When done correctly, this exercise requires leaning only a small distance.)  Hold this position for __________ seconds. Repeat __________ times. Complete this stretch __________ times per day. STRETCH - Quadriceps, Prone   Lie on your stomach on a firm surface, such as a bed or padded floor.  Bend your right / left knee and grasp your ankle. If you are unable to reach, your ankle or pant leg, use a belt around your foot to lengthen your reach.  Gently pull your heel toward your buttocks. Your knee should not slide out to the side. You should feel a stretch in the front of your thigh and/or knee.  Hold this position for __________ seconds. Repeat __________ times. Complete this stretch __________ times per day.  STRETCH - Hamstrings/Adductors, V-Sit   Sit on the floor with your legs extended in a large "V," keeping your knees straight.  With your head and chest upright, bend at your waist reaching for your right foot to stretch your left adductors.  You should feel a stretch in your left inner thigh. Hold for __________ seconds.  Return to the upright position to relax your leg muscles.  Continuing to keep your chest upright, bend straight  forward at your waist to stretch your hamstrings.  You should feel a stretch behind both of your thighs and/or knees. Hold for __________ seconds.  Return to the upright position to relax your leg muscles.  Repeat steps 2 through 4. Repeat __________ times. Complete this exercise __________ times per day.  STRENGTHENING EXERCISES - Prepatellar Bursitis  These exercises may help you when beginning to rehabilitate your injury. They may resolve your symptoms with or without further  involvement from your physician, physical therapist or athletic trainer. While completing these exercises, remember:  Muscles can gain both the endurance and the strength needed for everyday activities through controlled exercises.  Complete these exercises as instructed by your physician, physical therapist or athletic trainer. Progress the resistance and repetitions only as guided. STRENGTH - Quadriceps, Isometrics  Lie on your back with your right / left leg extended and your opposite knee bent.  Gradually tense the muscles in the front of your right / left thigh. You should see either your kneecap slide up toward your hip or increased dimpling just above the knee. This motion will push the back of the knee down toward the floor/mat/bed on which you are lying.  Hold the muscle as tight as you can without increasing your pain for __________ seconds.  Relax the muscles slowly and completely in between each repetition. Repeat __________ times. Complete this exercise __________ times per day.  STRENGTH - Quadriceps, Short Arcs   Lie on your back. Place a __________ inch towel roll under your knee so that the knee slightly bends.  Raise only your lower leg by tightening the muscles in the front of your thigh. Do not allow your thigh to rise.  Hold this position for __________ seconds. Repeat __________ times. Complete this exercise __________ times per day.  OPTIONAL ANKLE WEIGHTS: Begin with ____________________, but DO NOT exceed ____________________. Increase in1 lb/0.5 kg increments.  STRENGTH - Quadriceps, Straight Leg Raises  Quality counts! Watch for signs that the quadriceps muscle is working to insure you are strengthening the correct muscles and not "cheating" by substituting with healthier muscles.  Lay on your back with your right / left leg extended and your opposite knee bent.  Tense the muscles in the front of your right / left thigh. You should see either your kneecap slide  up or increased dimpling just above the knee. Your thigh may even quiver.  Tighten these muscles even more and raise your leg 4 to 6 inches off the floor. Hold for __________ seconds.  Keeping these muscles tense, lower your leg.  Relax the muscles slowly and completely in between each repetition. Repeat __________ times. Complete this exercise __________ times per day.  STRENGTH - Quadriceps, Step-Ups   Use a thick book, step or step stool that is __________ inches tall.  Holding a wall or counter for balance only, not support.  Slowly step-up with your right / left foot, keeping your knee in line with your hip and foot. Do not allow your knee to bend so far that you cannot see your toes.  Slowly unlock your knee and lower yourself to the starting position. Your muscles, not gravity, should lower you. Repeat __________ times. Complete this exercise __________ times per day. Document Released: 11/13/2005 Document Revised: 03/30/2014 Document Reviewed: 02/25/2009 [facility] Patient Information 2015 [place], [place]. This information is not intended to replace advice given to you by your health care provider. Make sure you discuss any questions you have with your health care provider.

## 2015-03-18 NOTE — Assessment & Plan Note (Signed)
Refilled Nexium 20mg  daily x 1 month supply per request Counseled on dietary changes to reduce GERD

## 2015-03-18 NOTE — Assessment & Plan Note (Addendum)
Consistent with subacute (x 2 months, improved) Right knee prepatellar bursitis with soft fluctuant swelling with localized anterior knee (no actual knee effusion). Suspected possible underlying arthritis or wear/tear due to high amt activity to trigger bursitis, although no prior knee x-rays. - No evidence of infection, no erythema or warmth, afebrile. - No pain or functional limitation. Taking Tramadol for other joint pain, has not tried conservative therapy  Plan: 1. Performed R-prepatellar bursa aspiration (as requested, declined conservative therapy). See procedure note for details. No complications 2. Counseled on likely recurrence, advised to wear R-knee sleeve for prevention 3. Return criteria given for possible infection following aspiration, otherwise routine return criteria if repeat swelling or knee pain

## 2015-03-19 ENCOUNTER — Other Ambulatory Visit: Payer: Self-pay | Admitting: *Deleted

## 2015-03-19 MED ORDER — ATORVASTATIN CALCIUM 20 MG PO TABS: 20.0000 mg | ORAL_TABLET | Freq: Every day | ORAL | Status: DC

## 2015-03-19 NOTE — Telephone Encounter (Signed)
Received request from pharmacy again.  Pt is requesting a 90 day supply.  Received verbal from Dr. Lonny Prude to change. [name], Jason Williams

## 2015-03-23 ENCOUNTER — Ambulatory Visit: Payer: Self-pay | Admitting: Family Medicine

## 2015-04-06 ENCOUNTER — Other Ambulatory Visit: Payer: Self-pay | Admitting: Family Medicine

## 2015-04-06 ENCOUNTER — Other Ambulatory Visit: Payer: Self-pay | Admitting: *Deleted

## 2015-04-06 DIAGNOSIS — F411 Generalized anxiety disorder: Secondary | ICD-10-CM

## 2015-04-06 DIAGNOSIS — M25579 Pain in unspecified ankle and joints of unspecified foot: Secondary | ICD-10-CM

## 2015-04-06 DIAGNOSIS — K219 Gastro-esophageal reflux disease without esophagitis: Secondary | ICD-10-CM

## 2015-04-06 NOTE — Telephone Encounter (Signed)
Pt called and needs refill on his Valium, Tramadol, and Ambien left up front for pickup. Please call when ready. jw

## 2015-04-07 NOTE — Telephone Encounter (Signed)
Pt cannot make the appt on Friday. He would like for Dr Lonny Prude to respond before Friday. The next available appt was 05-13-15. He needed the refills before then

## 2015-04-09 ENCOUNTER — Ambulatory Visit (INDEPENDENT_AMBULATORY_CARE_PROVIDER_SITE_OTHER): Payer: BLUE CROSS/BLUE SHIELD | Admitting: Family Medicine

## 2015-04-09 ENCOUNTER — Ambulatory Visit: Payer: Self-pay | Admitting: Family Medicine

## 2015-04-09 VITALS — BP 138/84 | HR 86 | Wt 196.6 lb

## 2015-04-09 DIAGNOSIS — K219 Gastro-esophageal reflux disease without esophagitis: Secondary | ICD-10-CM | POA: Diagnosis not present

## 2015-04-09 DIAGNOSIS — F411 Generalized anxiety disorder: Secondary | ICD-10-CM | POA: Diagnosis not present

## 2015-04-09 DIAGNOSIS — G4700 Insomnia, unspecified: Secondary | ICD-10-CM | POA: Diagnosis not present

## 2015-04-09 DIAGNOSIS — M25579 Pain in unspecified ankle and joints of unspecified foot: Secondary | ICD-10-CM | POA: Diagnosis not present

## 2015-04-09 DIAGNOSIS — N529 Male erectile dysfunction, unspecified: Secondary | ICD-10-CM

## 2015-04-09 MED ORDER — TADALAFIL 20 MG PO TABS: 10.0000 mg | ORAL_TABLET | ORAL | Status: DC | PRN

## 2015-04-09 MED ORDER — TRAMADOL HCL 50 MG PO TABS: 50.0000 mg | ORAL_TABLET | Freq: Three times a day (TID) | ORAL | Status: DC | PRN

## 2015-04-09 MED ORDER — DIAZEPAM 10 MG PO TABS: 10.0000 mg | ORAL_TABLET | Freq: Three times a day (TID) | ORAL | Status: DC

## 2015-04-09 MED ORDER — ESOMEPRAZOLE MAGNESIUM 20 MG PO CPDR
20.0000 mg | DELAYED_RELEASE_CAPSULE | Freq: Every day | ORAL | Status: DC | PRN
Start: 2015-04-09 — End: 2015-07-19

## 2015-04-09 MED ORDER — ROSUVASTATIN CALCIUM 40 MG PO TABS: 40.0000 mg | ORAL_TABLET | Freq: Every day | ORAL | Status: DC

## 2015-04-09 MED ORDER — ZOLPIDEM TARTRATE 10 MG PO TABS: 10.0000 mg | ORAL_TABLET | Freq: Every evening | ORAL | Status: DC | PRN

## 2015-04-09 NOTE — Patient Instructions (Signed)
Thank you for coming to see me today. It was a pleasure. Today we talked about:   We started Cialis today. Please let me know if you have side effects - refilled Nexium, Tramadol, Ambien and Diazepam  Please make an appointment to see me in 3 months for follow-up.  If you have any questions or concerns, please do not hesitate to call the office at [phone].  Sincerely,  Cordelia Poche, MD   Tadalafil tablets (Cialis) What is this medicine? TADALAFIL (tah DA la fil) is used to treat erection problems in men. It is also used for enlargement of the prostate gland in men, a condition called benign prostatic hyperplasia or BPH. This medicine improves urine flow and reduces BPH symptoms. This medicine can also treat both erection problems and BPH when they occur together. This medicine may be used for other purposes; ask your health care provider or pharmacist if you have questions. COMMON BRAND NAME(S): Cialis What should I tell my health care provider before I take this medicine? They need to know if you have any of these conditions: -bleeding disorders -eye or vision problems, including a rare inherited eye disease called retinitis pigmentosa -anatomical deformation of the penis, Peyronie's disease, or history of priapism (painful and prolonged erection) -heart disease, angina, a history of heart attack, irregular heart beats, or other heart problems -high or low blood pressure -history of blood diseases, like sickle cell anemia or leukemia -history of stomach bleeding -kidney disease -liver disease -stroke -an unusual or allergic reaction to tadalafil, other medicines, foods, dyes, or preservatives -pregnant or trying to get pregnant -breast-feeding How should I use this medicine? Take this medicine by mouth with a glass of water. Follow the directions on the prescription label. You may take this medicine with or without meals. When this medicine is used for erection problems,  your doctor may prescribe it to be taken once daily or as needed. If you are taking the medicine as needed, you may be able to have sexual activity 30 minutes after taking it and for up to 36 hours after taking it. Whether you are taking the medicine as needed or once daily, you should not take more than one dose per day. If you are taking this medicine for symptoms of benign prostatic hyperplasia (BPH) or to treat both BPH and an erection problem, take the dose once daily at about the same time each day. Do not take your medicine more often than directed. Talk to your pediatrician regarding the use of this medicine in children. Special care may be needed. Overdosage: If you think you have taken too much of this medicine contact a poison control center or emergency room at once. NOTE: This medicine is only for you. Do not share this medicine with others. What if I miss a dose? If you are taking this medicine as needed for erection problems, this does not apply. If you miss a dose while taking this medicine once daily for an erection problem, benign prostatic hyperplasia, or both, take it as soon as you remember, but do not take more than one dose per day. What may interact with this medicine? Do not take this medicine with any of the following medications: -nitrates like amyl nitrite, isosorbide dinitrate, isosorbide mononitrate, nitroglycerin -other medicines for erectile dysfunction like avanafil, sildenafil, vardenafil -other tadalafil products (Adcirca) This medicine may also interact with the following medications: -certain drugs for high blood pressure -certain drugs for the treatment of HIV infection or AIDS -  certain drugs used for fungal or yeast infections, like fluconazole, itraconazole, ketoconazole, and voriconazole -certain drugs used for seizures like carbamazepine, phenytoin, and phenobarbital -grapefruit juice -macrolide antibiotics like clarithromycin, erythromycin,  troleandomycin -medicines for prostate problems -rifabutin, rifampin or rifapentine This list may not describe all possible interactions. Give your health care provider a list of all the medicines, herbs, non-prescription drugs, or dietary supplements you use. Also tell them if you smoke, drink alcohol, or use illegal drugs. Some items may interact with your medicine. What should I watch for while using this medicine? If you notice any changes in your vision while taking this drug, call your doctor or health care professional as soon as possible. Stop using this medicine and call your health care provider right away if you have a loss of sight in one or both eyes. Contact your doctor or health care professional right away if the erection lasts longer than 4 hours or if it becomes painful. This may be a sign of serious problem and must be treated right away to prevent permanent damage. If you experience symptoms of nausea, dizziness, chest pain or arm pain upon initiation of sexual activity after taking this medicine, you should refrain from further activity and call your doctor or health care professional as soon as possible. Do not drink alcohol to excess (examples, 5 glasses of wine or 5 shots of whiskey) when taking this medicine. When taken in excess, alcohol can increase your chances of getting a headache or getting dizzy, increasing your heart rate or lowering your blood pressure. Using this medicine does not protect you or your partner against HIV infection (the virus that causes AIDS) or other sexually transmitted diseases. What side effects may I notice from receiving this medicine? Side effects that you should report to your doctor or health care professional as soon as possible: -allergic reactions like skin rash, itching or hives, swelling of the face, lips, or tongue -breathing problems -changes in hearing -changes in vision -chest pain -fast, irregular heartbeat -prolonged or painful  erection -seizures Side effects that usually do not require medical attention (report to your doctor or health care professional if they continue or are bothersome): -back pain -dizziness -flushing -headache -indigestion -muscle aches -nausea -stuffy or runny nose This list may not describe all possible side effects. Call your doctor for medical advice about side effects. You may report side effects to FDA at 1-800-FDA-1088. Where should I keep my medicine? Keep out of the reach of children. Store at room temperature between 15 and 30 degrees C (59 and 86 degrees F). Throw away any unused medicine after the expiration date. NOTE: This sheet is a summary. It may not cover all possible information. If you have questions about this medicine, talk to your doctor, pharmacist, or health care provider.  2015, Elsevier/Gold Standard. (2012-11-13 13:42:23)

## 2015-04-09 NOTE — Progress Notes (Signed)
    Subjective    Jason Williams is a 54 y.o. male that presents for a follow-up visit for chronic issues.   1. GERD: Nexium helping with symptoms. Patient is not taking medication every day.         2. Anxiety: Controlled with medication. No side effects  3. Ankle pain: Controlled with tramadol. No side effects  4. Insomnia: Controlled with Ambien. No side effects. Sleeping adequately  5. Erectile dysfunction: Patient reports issues with sustaining an erection. He has erections in the morning. He was previously taking Viagra, but   History  Substance Use Topics  . Smoking status: Never Smoker   . Smokeless tobacco: Never Used  . Alcohol Use: 1.2 oz/week    2 Glasses of wine per week    No Known Allergies  Meds ordered this encounter  Medications  . tadalafil (CIALIS) 20 MG tablet    Sig: Take 0.5-1 tablets (10-20 mg total) by mouth every other day as needed for erectile dysfunction.    Dispense:  10 tablet    Refill:  5  . diazepam (VALIUM) 10 MG tablet    Sig: Take 1 tablet (10 mg total) by mouth 3 (three) times daily.    Dispense:  90 tablet    Refill:  2    Patient needs appt for additional refills.  . rosuvastatin (CRESTOR) 40 MG tablet    Sig: Take 1 tablet (40 mg total) by mouth daily.    Dispense:  30 tablet    Refill:  3  . esomeprazole (NEXIUM) 20 MG capsule    Sig: Take 1 capsule (20 mg total) by mouth daily as needed. For acid reflux. Take 1 hour prior to first meal of the day.    Dispense:  90 capsule    Refill:  0  . traMADol (ULTRAM) 50 MG tablet    Sig: Take 1 tablet (50 mg total) by mouth every 8 (eight) hours as needed.    Dispense:  90 tablet    Refill:  2    Patient needs appt for additional refills.  Marland Kitchen zolpidem (AMBIEN) 10 MG tablet    Sig: Take 1 tablet (10 mg total) by mouth at bedtime as needed. for sleep    Dispense:  30 tablet    Refill:  2    ROS  Per HPI   Objective   BP 138/84 mmHg  Pulse 86  Wt 196 lb 9.6 oz (89.177 kg)   SpO2 99%  General: Well appearing, no distress Psych: Well dressed, full affect  Assessment and Plan   Please refer to problem based charting of assessment and plan

## 2015-04-09 NOTE — Telephone Encounter (Signed)
Received refill fax today from pharmacy.  Please also see refill note for 04/06/2015 also.  Will forward to MD. Johnney Ou

## 2015-04-15 ENCOUNTER — Encounter: Payer: Self-pay | Admitting: Family Medicine

## 2015-04-15 NOTE — Assessment & Plan Note (Signed)
Switch to Cialis 5-10 mg

## 2015-04-15 NOTE — Assessment & Plan Note (Signed)
   Refill Tramadol 50mg  TID

## 2015-04-15 NOTE — Assessment & Plan Note (Signed)
   Refill Ambien 10mg

## 2015-04-15 NOTE — Assessment & Plan Note (Signed)
   Refill diazepam

## 2015-04-15 NOTE — Assessment & Plan Note (Signed)
   Refill esomeprazole

## 2015-06-14 ENCOUNTER — Other Ambulatory Visit: Payer: Self-pay | Admitting: *Deleted

## 2015-06-15 MED ORDER — AMLODIPINE BESYLATE 5 MG PO TABS: 5.0000 mg | ORAL_TABLET | Freq: Every day | ORAL | Status: DC

## 2015-07-09 ENCOUNTER — Other Ambulatory Visit: Payer: Self-pay | Admitting: *Deleted

## 2015-07-09 DIAGNOSIS — M25579 Pain in unspecified ankle and joints of unspecified foot: Secondary | ICD-10-CM

## 2015-07-12 ENCOUNTER — Encounter: Payer: Self-pay | Admitting: Family Medicine

## 2015-07-12 ENCOUNTER — Ambulatory Visit (INDEPENDENT_AMBULATORY_CARE_PROVIDER_SITE_OTHER): Payer: BLUE CROSS/BLUE SHIELD | Admitting: Family Medicine

## 2015-07-12 VITALS — BP 176/110 | HR 86 | Temp 98.9°F | Ht 70.0 in | Wt 197.9 lb

## 2015-07-12 DIAGNOSIS — R079 Chest pain, unspecified: Secondary | ICD-10-CM

## 2015-07-12 DIAGNOSIS — M25579 Pain in unspecified ankle and joints of unspecified foot: Secondary | ICD-10-CM

## 2015-07-12 DIAGNOSIS — F411 Generalized anxiety disorder: Secondary | ICD-10-CM | POA: Diagnosis not present

## 2015-07-12 DIAGNOSIS — I10 Essential (primary) hypertension: Secondary | ICD-10-CM | POA: Diagnosis not present

## 2015-07-12 DIAGNOSIS — E785 Hyperlipidemia, unspecified: Secondary | ICD-10-CM

## 2015-07-12 MED ORDER — ZOLPIDEM TARTRATE 10 MG PO TABS: 10.0000 mg | ORAL_TABLET | Freq: Every evening | ORAL | Status: DC | PRN

## 2015-07-12 MED ORDER — DIAZEPAM 10 MG PO TABS: 10.0000 mg | ORAL_TABLET | Freq: Three times a day (TID) | ORAL | Status: DC

## 2015-07-12 MED ORDER — ROSUVASTATIN CALCIUM 40 MG PO TABS: 20.0000 mg | ORAL_TABLET | Freq: Every day | ORAL | Status: DC

## 2015-07-12 MED ORDER — TRAMADOL HCL 50 MG PO TABS: 50.0000 mg | ORAL_TABLET | Freq: Three times a day (TID) | ORAL | Status: DC | PRN

## 2015-07-12 NOTE — Assessment & Plan Note (Signed)
Patient currently not adherent. Can try taking half a pill of crestor (20mg  daily instead of 40mg  daily) to see if that improves tolerance

## 2015-07-12 NOTE — Assessment & Plan Note (Signed)
This has been evaluated prior. Previously referred to cardiology and was scheduled for a stress and echo, but patient states did not complete work up secondary to being told the price would be $7000-$10000. Will refer back to cardiology as I doubt that price is correct. Patient needs to have a stress as he has risk factors for CAD

## 2015-07-12 NOTE — Assessment & Plan Note (Signed)
   Refill Tramadol 50mg  TID

## 2015-07-12 NOTE — Patient Instructions (Signed)
Thank you for coming to see me today. It was a pleasure. Today we talked about:    Hypertension/chest pain: I am referring you back to cardiology for a stress test and evaluation.  Anxiety: Refill of your diazepam  Chronic ankle pain: refill Tramadol  Insomnia: Refill Ambien  Please make an appointment to see me in 6 months for follow-up of chronic issues.  If you have any questions or concerns, please do not hesitate to call the office at [phone].  Sincerely,  Cordelia Poche, MD

## 2015-07-12 NOTE — Assessment & Plan Note (Signed)
   Refill diazepam

## 2015-07-12 NOTE — Progress Notes (Signed)
    Subjective    Jason Williams is a 54 y.o. male that presents for a follow-up visit for chronic issues.   1. Hypertension: Patient is adherent with amlodipine 5mg . He states he has a recent BP of 170s/110s while at a physician's office for work with symptoms of chest pain. He has infrequent recurrent chest pain usually with exertion that lasts an hour. He has not been tolerating the Crestor  2. Anxiety: Controlled on diazepam 10mg  TID  3. Ankle pain: Controlled on Tramadol 50mg  TID  4. Insomnia: Controlled on Ambien 10mg  qD  Social History  Substance Use Topics  . Smoking status: Never Smoker   . Smokeless tobacco: Never Used  . Alcohol Use: 1.2 oz/week    2 Glasses of wine per week    No Known Allergies  Meds ordered this encounter  Medications  . diazepam (VALIUM) 10 MG tablet    Sig: Take 1 tablet (10 mg total) by mouth 3 (three) times daily.    Dispense:  90 tablet    Refill:  5    Patient needs appt for additional refills.  . traMADol (ULTRAM) 50 MG tablet    Sig: Take 1 tablet (50 mg total) by mouth every 8 (eight) hours as needed.    Dispense:  90 tablet    Refill:  5    Patient needs appt for additional refills.  Marland Kitchen zolpidem (AMBIEN) 10 MG tablet    Sig: Take 1 tablet (10 mg total) by mouth at bedtime as needed. for sleep    Dispense:  30 tablet    Refill:  5  . rosuvastatin (CRESTOR) 40 MG tablet    Sig: Take 0.5 tablets (20 mg total) by mouth daily.    Dispense:  30 tablet    Refill:  3    ROS  Per HPI   Objective   BP 137/85 mmHg  Pulse 86  Temp(Src) 98.9 F (37.2 C) (Oral)  Ht 5\' 10"  (1.778 m)  Wt 197 lb 14.4 oz (89.767 kg)  BMI 28.40 kg/m2  General: Well appearing, no distress  Assessment and Plan   Please refer to problem based charting of assessment and plan

## 2015-07-12 NOTE — Assessment & Plan Note (Signed)
Controlled. No changes to medication regimen

## 2015-07-19 ENCOUNTER — Other Ambulatory Visit: Payer: Self-pay | Admitting: *Deleted

## 2015-07-19 DIAGNOSIS — K219 Gastro-esophageal reflux disease without esophagitis: Secondary | ICD-10-CM

## 2015-07-19 MED ORDER — ESOMEPRAZOLE MAGNESIUM 20 MG PO CPDR: 20.0000 mg | DELAYED_RELEASE_CAPSULE | Freq: Every day | ORAL | Status: DC | PRN

## 2015-07-27 ENCOUNTER — Ambulatory Visit (INDEPENDENT_AMBULATORY_CARE_PROVIDER_SITE_OTHER): Payer: BLUE CROSS/BLUE SHIELD | Admitting: Cardiology

## 2015-07-27 ENCOUNTER — Encounter: Payer: Self-pay | Admitting: Cardiology

## 2015-07-27 VITALS — BP 130/88 | HR 82 | Ht 70.0 in | Wt 201.8 lb

## 2015-07-27 DIAGNOSIS — R079 Chest pain, unspecified: Secondary | ICD-10-CM

## 2015-07-27 MED ORDER — METOPROLOL SUCCINATE ER 25 MG PO TB24: 12.5000 mg | ORAL_TABLET | Freq: Every day | ORAL | Status: DC

## 2015-07-27 MED ORDER — NITROGLYCERIN 0.4 MG SL SUBL: 0.4000 mg | SUBLINGUAL_TABLET | SUBLINGUAL | Status: DC | PRN

## 2015-07-27 NOTE — Patient Instructions (Signed)
Medication Instructions:  Start toprol (12.5 mg ) twice a day Nitroglycerin  (0.4 mg) sublingual , take every 5 mintues up to 15 mintues if no relief call 911 Sent into patients requested pharmacy today. Labwork: -None  Testing/Procedures: Your physician has requested that you have an echocardiogram. Echocardiography is a painless test that uses sound waves to create images of your heart. It provides your doctor with information about the size and shape of your heart and how well your heart's chambers and valves are working. This procedure takes approximately one hour. There are no restrictions for this procedure.  Your physician has requested that you have en exercise stress myoview. For further information please visit [email or web address]. Please follow instruction sheet, as given.    Follow-Up:  Your physician recommends that you keep your scheduled  follow-up appointment with Lyda Jester, PA   Any Other Special Instructions Will Be Listed Below (If Applicable).

## 2015-07-27 NOTE — Progress Notes (Signed)
07/27/2015 Jason Williams   1961-10-05  [id number]  Primary Physician Cordelia Poche, MD Primary Cardiologist: Dr. Angelena Form   Reason for Visit/CC: Chest Pain  HPI:  54 y/o male with history of HTN, HLD, GERD, anxiety and depression who returns to clinic for evaluation of chest pain. He also notes a strong family history of CAD. His father suffered and died from a myocardial infarction at the age of 21. His maternal grandfather also had coronary artery disease undergoing CABG surgery.   He was seen by Dr. Angelena Form one year ago in June 2015 for the same complaints. Dr. Angelena Form  ordered for him to undergo an exercise tolerance test as well as a 2-D echocardiogram however the patient failed to get the studies done.  He now presents back to clinic wanting to undergo further evaluation. He notes decreased exercise tolerance recently, mostly with mild dyspnea on exertion.  No resting dyspnea. He also recalls an episode of left-sided chest discomfort that occurred 1 week ago at rest. Recently, he has also had difficulties controlling his blood pressure. During the time of this chest discomfort, he checked his blood pressure which was elevated at 177/117. Since that time he, has continued to check his pressures at home and has notice increased diastolic blood pressures in the low 100s. His blood pressure today in clinic is actually well controlled at 130/88. He reports full medication compliance with amlodipine. He is currently chest pain-free. EKG shows normal sinus rhythm without any ischemic abnormalities. Review of laboratory studies revealed an elevated LDL of 142 mg/dL 6 months ago. He reports that he has not been fully compliant with Crestor due to side effects/myalgias/ arthralgias.   Current Outpatient Prescriptions  Medication Sig Dispense Refill  . amLODipine (NORVASC) 5 MG tablet Take 1 tablet (5 mg total) by mouth daily. 90 tablet 3  . [facility] ASPIRIN CHILD 81 MG chewable tablet TAKE 1 PILL  DAILY TO THIN BLOOD TO HELP TO PREVENT HEART ATTACK AND STROKE 90 tablet 3  . diazepam (VALIUM) 10 MG tablet Take 1 tablet (10 mg total) by mouth 3 (three) times daily. 90 tablet 5  . esomeprazole (NEXIUM) 20 MG capsule Take 1 capsule (20 mg total) by mouth daily as needed. For acid reflux. Take 1 hour prior to first meal of the day. 90 capsule 0  . fluticasone (FLONASE) 50 MCG/ACT nasal spray Place 2 sprays into both nostrils daily. 16 g 6  . rosuvastatin (CRESTOR) 40 MG tablet Take 0.5 tablets (20 mg total) by mouth daily. 30 tablet 3  . tadalafil (CIALIS) 20 MG tablet Take 0.5-1 tablets (10-20 mg total) by mouth every other day as needed for erectile dysfunction. 10 tablet 5  . traMADol (ULTRAM) 50 MG tablet Take 1 tablet (50 mg total) by mouth every 8 (eight) hours as needed. 90 tablet 5  . zolpidem (AMBIEN) 10 MG tablet Take 1 tablet (10 mg total) by mouth at bedtime as needed. for sleep 30 tablet 5   No current facility-administered medications for this visit.    No Known Allergies  Social History   Social History  . Marital Status: Single    Spouse Name: N/A  . Number of Children: 1  . Years of Education: N/A   Occupational History  . Engineer    Social History Main Topics  . Smoking status: Never Smoker   . Smokeless tobacco: Never Used  . Alcohol Use: 1.2 oz/week    2 Glasses of wine per week  .  Drug Use: No  . Sexual Activity: Yes    Birth Control/ Protection: Pill, None     Comment: 1 male   Other Topics Concern  . Not on file   Social History Narrative     Review of Systems: General: negative for chills, fever, night sweats or weight changes.  Cardiovascular: negative for chest pain, dyspnea on exertion, edema, orthopnea, palpitations, paroxysmal nocturnal dyspnea or shortness of breath Dermatological: negative for rash Respiratory: negative for cough or wheezing Urologic: negative for hematuria Abdominal: negative for nausea, vomiting, diarrhea, bright  red blood per rectum, melena, or hematemesis Neurologic: negative for visual changes, syncope, or dizziness All other systems reviewed and are otherwise negative except as noted above.    Blood pressure 130/88, pulse 82, height 5\' 10"  (1.778 m), weight 201 lb 12.8 oz (91.536 kg).  General appearance: alert, cooperative and no distress Neck: no carotid bruit and no JVD Lungs: clear to auscultation bilaterally Heart: regular rate and rhythm, S1, S2 normal, no murmur, click, rub or gallop Extremities: no LEE Pulses: 2+ and symmetric Skin: warm and dry Neurologic: Grossly normal  EKG NSR, no ischemia  ASSESSMENT AND PLAN:   1. Chest pain/decreased exercise tolerance: Plan for exercise Myoview to rule out ischemia. We'll also plan for a 2-D echocardiogram to assess left ventricular systolic function, wall motion and valve anatomy. For now, continue aspirin. We'll add low-dose Metroprolol, 12.5 mg twice a day. Patient encouraged to retry low-dose statin therapy if can tolerate. Subungual nitroglycerin was also prescribed. We reviewed the proper use of sublingual nitroglycerin as well as avoiding concomitant use with Cialis.  2. Hypertension: He notes recent issues with hypertension as described above. Blood pressure in clinic today is decently controlled at 130/88. Continue amlodipine. Given concerns for underlying coronary disease, will add low dose beta blocker therapy with 12.5 mg with Metroprolol twice a day.  3. HLD: Lipid panel 6 months ago revealed an elevated LDL at 142 mg/dL. His PCP prescribed Crestor however the patient reports that he has been intolerant due to arthralgias/myalgias. Patient encouraged to retry at a lower dose of 10 mg daily.   PLAN  F/u after stress test and 2D echo.   Lyda Jester PA-C 07/27/2015 4:16 PM

## 2015-08-03 ENCOUNTER — Telehealth (HOSPITAL_COMMUNITY): Payer: Self-pay | Admitting: *Deleted

## 2015-08-03 NOTE — Telephone Encounter (Signed)
Patient given detailed instructions per Myocardial Perfusion Study Information Sheet for test on 08/05/15 at 0815. Patient notified to arrive 15 minutes early and that it is imperative to arrive on time for appointment to keep from having the test rescheduled.  If you need to cancel or reschedule your appointment, please call the office within 24 hours of your appointment. Failure to do so may result in a cancellation of your appointment, and a $50 no show fee. Patient verbalized understanding. [place], [place]

## 2015-08-03 NOTE — Telephone Encounter (Signed)
Left message on voicemail in reference to upcoming appointment scheduled for 08/05/15. Phone number given for a call back so details instructions can be given. Jason Williams, Jason Williams

## 2015-08-04 ENCOUNTER — Other Ambulatory Visit (HOSPITAL_COMMUNITY): Payer: Self-pay

## 2015-08-04 ENCOUNTER — Other Ambulatory Visit: Payer: Self-pay | Admitting: *Deleted

## 2015-08-04 MED ORDER — ROSUVASTATIN CALCIUM 40 MG PO TABS: 20.0000 mg | ORAL_TABLET | Freq: Every day | ORAL | Status: DC

## 2015-08-05 ENCOUNTER — Other Ambulatory Visit: Payer: Self-pay

## 2015-08-05 ENCOUNTER — Ambulatory Visit (HOSPITAL_BASED_OUTPATIENT_CLINIC_OR_DEPARTMENT_OTHER): Payer: BLUE CROSS/BLUE SHIELD

## 2015-08-05 ENCOUNTER — Ambulatory Visit (HOSPITAL_COMMUNITY): Payer: BLUE CROSS/BLUE SHIELD | Attending: Cardiovascular Disease

## 2015-08-05 DIAGNOSIS — R079 Chest pain, unspecified: Secondary | ICD-10-CM

## 2015-08-05 DIAGNOSIS — R0609 Other forms of dyspnea: Secondary | ICD-10-CM | POA: Diagnosis not present

## 2015-08-05 DIAGNOSIS — I10 Essential (primary) hypertension: Secondary | ICD-10-CM | POA: Insufficient documentation

## 2015-08-05 DIAGNOSIS — I351 Nonrheumatic aortic (valve) insufficiency: Secondary | ICD-10-CM | POA: Insufficient documentation

## 2015-08-05 DIAGNOSIS — R002 Palpitations: Secondary | ICD-10-CM | POA: Insufficient documentation

## 2015-08-05 DIAGNOSIS — Z8249 Family history of ischemic heart disease and other diseases of the circulatory system: Secondary | ICD-10-CM | POA: Insufficient documentation

## 2015-08-05 DIAGNOSIS — I517 Cardiomegaly: Secondary | ICD-10-CM | POA: Diagnosis not present

## 2015-08-05 DIAGNOSIS — E785 Hyperlipidemia, unspecified: Secondary | ICD-10-CM | POA: Insufficient documentation

## 2015-08-05 LAB — MYOCARDIAL PERFUSION IMAGING
CHL CUP NUCLEAR SSS: 3
CSEPED: 9 min
CSEPEW: 10.1 METS
CSEPPHR: 150 {beats}/min
Exercise duration (sec): 0 s
LV dias vol: 109 mL
LVSYSVOL: 50 mL
MPHR: 166 {beats}/min
Percent HR: 90 %
RATE: 0.35
Rest HR: 75 {beats}/min
SDS: 2
SRS: 1
TID: 0.96

## 2015-08-05 MED ORDER — TECHNETIUM TC 99M SESTAMIBI GENERIC - CARDIOLITE
31.6000 | Freq: Once | INTRAVENOUS | Status: AC | PRN
  Administered 2015-08-05: 32 via INTRAVENOUS

## 2015-08-05 MED ORDER — TECHNETIUM TC 99M SESTAMIBI GENERIC - CARDIOLITE
10.9000 | Freq: Once | INTRAVENOUS | Status: AC | PRN
  Administered 2015-08-05: 10.9 via INTRAVENOUS

## 2015-08-06 ENCOUNTER — Other Ambulatory Visit: Payer: Self-pay | Admitting: *Deleted

## 2015-08-06 NOTE — Telephone Encounter (Signed)
Patient's insurance is requiring 90 day supply of Crestor.  Please advise.  Derl Barrow, RN

## 2015-08-09 ENCOUNTER — Telehealth: Payer: Self-pay | Admitting: *Deleted

## 2015-08-09 MED ORDER — ROSUVASTATIN CALCIUM 40 MG PO TABS: 20.0000 mg | ORAL_TABLET | Freq: Every day | ORAL | Status: DC

## 2015-08-09 NOTE — Telephone Encounter (Signed)
-----   Message from Consuelo Pandy, [place] sent at 08/05/2015  5:05 PM EDT ----- Normal ultrasound. Normal pump function. No heart failure. No significant valve abnormalities.

## 2015-08-09 NOTE — Telephone Encounter (Signed)
Called pt and advised him that his Echo was normal and that his EF (which he asked about) was 55%.  Pt verified his f/u appt with Ellen Henri, PA-C for 08-23-15 @ 8:00..Pt verbalized understanding.

## 2015-08-23 ENCOUNTER — Ambulatory Visit: Payer: Self-pay | Admitting: Cardiology

## 2015-08-30 ENCOUNTER — Encounter: Payer: Self-pay | Admitting: Physician Assistant

## 2015-08-30 NOTE — Progress Notes (Addendum)
Cardiology Office Note Date:  08/31/2015  Patient ID:  Jason Williams, Jason Williams 11-13-1961, MRN [id number] PCP:  Cordelia Poche, MD  Cardiologist:  Dr. Angelena Form   Chief Complaint: f/u chest pain  History of Present Illness: Jason Williams is a 54 y.o. male with history of HTN, HLD, GERD, anxiety, depression, and family history of CAD who presents for f/u. He previously saw Dr. Angelena Form in June 2015 for complains of chest discomfort, who ordered an ETT and 2D echo but the patient failed to get these studies done at the time. Labs 12/2014 fairly unremarkable except LDL 142. He was recently re-evaluated by Lyda Jester, PA-C who reordered echo/nuc. 2D echo 08/05/15: EF 55%, no RWMA, mildly dilated LA. Nuclear stress test 08/05/15: EF 54%, no ischemia, LVEF 54% with no segmental WMA (exercised 50min without angina), peak BP 208/54.  He comes in today for follow-up. He denies any recurrent chest pain. He does report that BP occasionally runs higher at home. Reports rare skipped beat which he attributes to infrequent PVCs - not particularly bothersome. The patient recently broke his toe and since that time has had pain, swelling and discoloration around the toe and forefoot. He said he mentioned this to the cardiologists in [facility] he was working alongside yesterday (works for [employer]) - they recommended he get an antibiotic for this. He asks if there is something we can prescribe today.   Past Medical History  Diagnosis Date  . Anxiety   . Depression   . GERD (gastroesophageal reflux disease)   . Hyperlipidemia   . Hypertension     Past Surgical History  Procedure Laterality Date  . Appendectomy  1981  . Ankle re construction  2011    bilateral    Current Outpatient Prescriptions  Medication Sig Dispense Refill  . aspirin ([facility] ASPIRIN CHILD) 81 MG chewable tablet TAKE 1 TABLET BY MOUTH DAILY TO THIN BLOOD TO HELP TO PREVENT HEART ATTACK AND STROKE    . diazepam (VALIUM) 10 MG tablet Take  1 tablet (10 mg total) by mouth 3 (three) times daily. 90 tablet 5  . esomeprazole (NEXIUM) 20 MG capsule Take 1 capsule (20 mg total) by mouth daily as needed. For acid reflux. Take 1 hour prior to first meal of the day. 90 capsule 0  . fluticasone (FLONASE) 50 MCG/ACT nasal spray Place 2 sprays into both nostrils daily. 16 g 6  . metoprolol succinate (TOPROL-XL) 25 MG 24 hr tablet Take 0.5 tablets (12.5 mg total) by mouth daily. 30 tablet 9  . nitroGLYCERIN (NITROSTAT) 0.4 MG SL tablet Place 1 tablet (0.4 mg total) under the tongue every 5 (five) minutes as needed for chest pain. 25 tablet 3  . rosuvastatin (CRESTOR) 40 MG tablet Take 0.5 tablets (20 mg total) by mouth daily. 45 tablet 3  . tadalafil (CIALIS) 20 MG tablet Take 0.5-1 tablets (10-20 mg total) by mouth every other day as needed for erectile dysfunction. 10 tablet 5  . traMADol (ULTRAM) 50 MG tablet Take 50 mg by mouth every 8 (eight) hours as needed (pain).    Marland Kitchen zolpidem (AMBIEN) 10 MG tablet Take 1 tablet (10 mg total) by mouth at bedtime as needed. for sleep 30 tablet 5  . amLODipine (NORVASC) 10 MG tablet Take 1 tablet (10 mg total) by mouth daily. 90 tablet 3   No current facility-administered medications for this visit.    Allergies:   Review of patient's allergies indicates no known allergies.   Social  History:  The patient  reports that he has never smoked. He has never used smokeless tobacco. He reports that he drinks about 1.2 oz of alcohol per week. He reports that he does not use illicit drugs.   Family History:  The patient's family history includes Esophageal cancer in his mother; Healthy in his sister; Heart attack in his father and maternal grandfather; Heart disease in his maternal grandfather.  ROS:  Please see the history of present illness.    All other systems are reviewed and otherwise negative.   PHYSICAL EXAM:  VS:  BP 134/92 mmHg  Pulse 85  Ht 5\' 10"  (1.778 m)  Wt 201 lb (91.173 kg)  BMI 28.84 kg/m2  BMI: Body mass index is 28.84 kg/(m^2). Well nourished, well developed WM, in no acute distress HEENT: normocephalic, atraumatic Neck: no JVD, carotid bruits or masses Cardiac:  normal S1, S2; RRR; no murmurs, rubs, or gallops Lungs:  clear to auscultation bilaterally, no wheezing, rhonchi or rales Abd: soft, nontender, no hepatomegaly, + BS MS: no deformity or atrophy Ext: no edema. LLE in boot - 4th toe appears ecchymotic with oozing wound; forefoot and toes are edematous and erythematous. Skin: warm and dry, no rash Neuro:  moves all extremities spontaneously, no focal abnormalities noted, follows commands Psych: euthymic mood, full affect  Recent Labs: 01/25/2015: ALT 26; BUN 11; Creat 0.95; Hemoglobin 14.6; Platelets 241; Potassium 4.6; Sodium 139; TSH 2.248  01/25/2015: Cholesterol 205*; HDL 34*; LDL Cholesterol 142*; Total CHOL/HDL Ratio 6.0; Triglycerides 143; VLDL 29   CrCl cannot be calculated (Patient has no serum creatinine result on file.).   Wt Readings from Last 3 Encounters:  08/31/15 201 lb (91.173 kg)  08/05/15 201 lb (91.173 kg)  07/27/15 201 lb 12.8 oz (91.536 kg)     Other studies reviewed: Additional studies/records reviewed today include: summarized above  ASSESSMENT AND PLAN:  1. Chest pain - recent nonischemic nuc and unremarkable echocardiogram as above. Continue risk factor modification. 2. Essential HTN - patient reports this is still intermittently elevated at home. It is 134/92 after taking AM meds. He had hypertensive response to exercise on recent nuclear stress test. Will increase amlodipine to 10mg  daily. Asked patient to monitor BP at home and call if it continues to run >130/90. 3. Hyperlipidemia - he was previously intermittently noncompliant with Crestor. Further management per primary care doctor. 4. GERD - maintained on PPI. 5. S/p broken toe - I told him I do not feel comfortable just prescribing an antibiotic for this injury as it looks bad  and will require close f/u. (We will only be seeing patient PRN.) He was planning to see his PCP at some point for this but there is no definite plan in place. There is discoloration, continued oozing and significant edema around the forefoot. Instead I recommended he be evaluated today by urgent care as further swelling could compromise the nerves/vasculature in this area. He verbalized understanding.  Disposition: F/u with cardiology only as needed. Offered to patient to continue to follow yearly for risk stratification but he politely declined.   Current medicines are reviewed at length with the patient today.  The patient did not have any concerns regarding medicines.  Raechel Ache PA-C 08/31/2015 8:21 AM     [facility] [address] [place] [address] [phone] (office)  [phone] (fax)

## 2015-08-31 ENCOUNTER — Ambulatory Visit (INDEPENDENT_AMBULATORY_CARE_PROVIDER_SITE_OTHER): Payer: BLUE CROSS/BLUE SHIELD | Admitting: Physician Assistant

## 2015-08-31 ENCOUNTER — Ambulatory Visit (INDEPENDENT_AMBULATORY_CARE_PROVIDER_SITE_OTHER): Payer: BLUE CROSS/BLUE SHIELD | Admitting: Family Medicine

## 2015-08-31 ENCOUNTER — Encounter: Payer: Self-pay | Admitting: Physician Assistant

## 2015-08-31 VITALS — BP 120/82 | HR 82 | Temp 97.7°F | Resp 18 | Ht 70.25 in | Wt 202.4 lb

## 2015-08-31 VITALS — BP 134/92 | HR 85 | Ht 70.0 in | Wt 201.0 lb

## 2015-08-31 DIAGNOSIS — M79672 Pain in left foot: Secondary | ICD-10-CM

## 2015-08-31 DIAGNOSIS — E785 Hyperlipidemia, unspecified: Secondary | ICD-10-CM | POA: Diagnosis not present

## 2015-08-31 DIAGNOSIS — L03032 Cellulitis of left toe: Secondary | ICD-10-CM

## 2015-08-31 DIAGNOSIS — I10 Essential (primary) hypertension: Secondary | ICD-10-CM | POA: Diagnosis not present

## 2015-08-31 DIAGNOSIS — K219 Gastro-esophageal reflux disease without esophagitis: Secondary | ICD-10-CM

## 2015-08-31 DIAGNOSIS — R079 Chest pain, unspecified: Secondary | ICD-10-CM

## 2015-08-31 DIAGNOSIS — S92912A Unspecified fracture of left toe(s), initial encounter for closed fracture: Secondary | ICD-10-CM

## 2015-08-31 MED ORDER — HYDROCODONE-ACETAMINOPHEN 5-325 MG PO TABS: 1.0000 | ORAL_TABLET | Freq: Four times a day (QID) | ORAL | Status: DC | PRN

## 2015-08-31 MED ORDER — DOXYCYCLINE HYCLATE 100 MG PO TABS
100.0000 mg | ORAL_TABLET | Freq: Two times a day (BID) | ORAL | Status: DC
Start: 2015-08-31 — End: 2016-03-31

## 2015-08-31 MED ORDER — AMLODIPINE BESYLATE 10 MG PO TABS: 10.0000 mg | ORAL_TABLET | Freq: Every day | ORAL | Status: DC

## 2015-08-31 NOTE — Patient Instructions (Addendum)
Medication Instructions:   START TAKING AMLODIPINE 10 MG ONCE A DAY   Labwork: NONE ORDER TODAY    Testing/Procedures: NONE ORDER TODAY    Follow-Up:  Call or return to clinic AS NEEDED  FOR  Cardiac related symptoms   Any Other Special Instructions Will Be Listed Below (If Applicable).  CONTACT OFFICE IF BLOOD PRESSURE RUNS HIGHER THAN 130/90

## 2015-08-31 NOTE — Patient Instructions (Signed)
Please see your personal care physician in one week to follow-up on this infection.

## 2015-08-31 NOTE — Progress Notes (Signed)
° °  Subjective:    Patient ID: Jason Williams, male    DOB: [birth year], 54 y.o.   MRN: [id number] This chart was scribed for Robyn Haber, MD by Zola Button, Medical Scribe. This patient was seen in Room 11 and the patient's care was started at 9:17 AM.   HPI HPI Comments: Jason Williams is a 54 y.o. male who presents to the [facility] complaining of left foot pain with swelling and redness to his left foot that started over a week ago. Patient fractured his 4th metatarsal of left foot after he jumped off of the 2nd rung of a ladder over a week ago. He then started having pain and swelling a few days later and was found to have a stress fracture. He believes the wound has become infected due to rubbing against the boot. NKDA to antibiotics.  Patient denies having substance abuse problem. He says tramadol is not helping him.  Patient works at [employer] working for [employer].  PCP: Dr. Lonny Prude at [facility]  Review of Systems  Musculoskeletal: Positive for joint swelling and arthralgias.  Skin: Positive for color change.       Objective:   Physical Exam CONSTITUTIONAL: Well developed/well nourished HEAD: Normocephalic/atraumatic EYES: EOM/PERRL ENMT: Mucous membranes moist NECK: supple no meningeal signs SPINE: entire spine nontender GU: no cva tenderness NEURO: Pt is awake/alert, moves all extremitiesx4 EXTREMITIES: pulses normal, full ROM; swollen left dorsal foot with diffuse erythema extending from ulcerated cellulitic left proximal fourth toe SKIN: warm; ulcerated blister left dorsal proximal fourth toe with surrounding erythema PSYCH: no abnormalities of mood noted      Assessment & Plan:   By signing my name below, I, Zola Button, attest that this documentation has been prepared under the direction and in the presence of Robyn Haber, MD.  Electronically Signed: Zola Button, Medical Scribe. 08/31/2015. 9:17  AM.  This chart was scribed in my presence and reviewed by me personally.    ICD-9-CM ICD-10-CM   1. Cellulitis of toe of left foot 681.10 L03.032 doxycycline (VIBRA-TABS) 100 MG tablet     Wound culture     HYDROcodone-acetaminophen (NORCO) 5-325 MG tablet  2. Left foot pain 729.5 M79.672 HYDROcodone-acetaminophen (NORCO) 5-325 MG tablet     Signed, Robyn Haber, MD

## 2015-09-01 ENCOUNTER — Encounter: Payer: Self-pay | Admitting: Internal Medicine

## 2015-09-01 ENCOUNTER — Telehealth: Payer: Self-pay | Admitting: Family Medicine

## 2015-09-01 ENCOUNTER — Ambulatory Visit (INDEPENDENT_AMBULATORY_CARE_PROVIDER_SITE_OTHER): Payer: BLUE CROSS/BLUE SHIELD | Admitting: Internal Medicine

## 2015-09-01 VITALS — BP 133/76 | HR 91 | Temp 97.7°F

## 2015-09-01 DIAGNOSIS — L03032 Cellulitis of left toe: Secondary | ICD-10-CM

## 2015-09-01 DIAGNOSIS — L02612 Cutaneous abscess of left foot: Secondary | ICD-10-CM

## 2015-09-01 MED ORDER — CEFTRIAXONE SODIUM 1 G IJ SOLR: 2.0000 g | Freq: Once | INTRAMUSCULAR | Status: DC

## 2015-09-01 MED ORDER — CEFTRIAXONE SODIUM 1 G IJ SOLR
1.0000 g | Freq: Once | INTRAMUSCULAR | Status: AC
  Administered 2015-09-01: 1 g via INTRAMUSCULAR

## 2015-09-01 NOTE — Telephone Encounter (Signed)
Went to urgent care yesterday with swollen left foot and infection. Was given antibotic. Seems to be much worse today. Is afraid of losing his toe.  Please advise

## 2015-09-01 NOTE — Patient Instructions (Addendum)
Please continue to take doxycycline. Will need follow up tomorrow. Do not go to work tomorrow. Continue to keep foot up.   Cellulitis Cellulitis is an infection of the skin and the tissue beneath it. The infected area is usually red and tender. Cellulitis occurs most often in the arms and lower legs.  CAUSES  Cellulitis is caused by bacteria that enter the skin through cracks or cuts in the skin. The most common types of bacteria that cause cellulitis are staphylococci and streptococci. SIGNS AND SYMPTOMS   Redness and warmth.  Swelling.  Tenderness or pain.  Fever. DIAGNOSIS  Your health care provider can usually determine what is wrong based on a physical exam. Blood tests may also be done. TREATMENT  Treatment usually involves taking an antibiotic medicine. HOME CARE INSTRUCTIONS   Take your antibiotic medicine as directed by your health care provider. Finish the antibiotic even if you start to feel better.  Keep the infected arm or leg elevated to reduce swelling.  Apply a warm cloth to the affected area up to 4 times per day to relieve pain.  Take medicines only as directed by your health care provider.  Keep all follow-up visits as directed by your health care provider. SEEK MEDICAL CARE IF:   You notice red streaks coming from the infected area.  Your red area gets larger or turns dark in color.  Your bone or joint underneath the infected area becomes painful after the skin has healed.  Your infection returns in the same area or another area.  You notice a swollen bump in the infected area.  You develop new symptoms.  You have a fever. SEEK IMMEDIATE MEDICAL CARE IF:   You feel very sleepy.  You develop vomiting or diarrhea.  You have a general ill feeling (malaise) with muscle aches and pains.   This information is not intended to replace advice given to you by your health care provider. Make sure you discuss any questions you have with your health care  provider.   Document Released: 08/23/2005 Document Revised: 08/04/2015 Document Reviewed: 01/29/2012 Elsevier Interactive Patient Education [employer].

## 2015-09-01 NOTE — Assessment & Plan Note (Addendum)
Cellulitis: 3 day onset, has had 2 days of doxycycline. Received single dose 1g Ceftriaxone in the clinic today. Physical exam showing 2 cm wound on 4th digit of left foot, erythematous, warm, swollen, with pustulant drainage. Picture in note and media tab.  - Will continue with doxycycline BID  - Will give a shot of Ceftriaxone at this visit  - Patient to continue with rest and elevation of foot  - Will follow up tomorrow to see if improvement, if worsening will consider hospitalization

## 2015-09-01 NOTE — Progress Notes (Signed)
Patient ID: CAMP GOPAL, male   DOB: 19-Mar-1961, 54 y.o.   MRN: [id number]   [facility] Kerrin Mo, MD Phone: [phone]  Subjective:   # Cellulitis of the 4 left digit of foot. Patient was having swelling and slight pain last Tuesday.  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 So he took imaging of his toe and found a small stress fracture. He states that he has not had any associated trauma with this toe. 3 days ago, his toe rubbed up against his shoe creating a slight ulceration. The toe then started to increasing painful, erythematous, and pustulant. Patient went to urgent care yesterday and received doxocycline, has taken 4 pills over the past 2 days. However that toes has been getting worse. He is also taking Norco and Ibuprofen for the pain. No hx cellulitis, poor vasculature, diabetes. Patient does a lot of work in hospitals. No fevers, no chills, no nausea, vomit, no flu like symptoms, no fatigue.   All relevant systems were reviewed and were negative unless otherwise noted in the HPI  Past Medical History Reviewed problem list.  Medications- reviewed and updated Current Outpatient Prescriptions  Medication Sig Dispense Refill  . amLODipine (NORVASC) 10 MG tablet Take 1 tablet (10 mg total) by mouth daily. 90 tablet 3  . aspirin ([facility] ASPIRIN CHILD) 81 MG chewable tablet TAKE 1 TABLET BY MOUTH DAILY TO THIN BLOOD TO HELP TO PREVENT HEART ATTACK AND STROKE    . diazepam (VALIUM) 10 MG tablet Take 1 tablet (10 mg total) by mouth 3 (three) times daily. 90 tablet 5  . doxycycline (VIBRA-TABS) 100 MG tablet Take 1 tablet (100 mg total) by mouth 2 (two) times daily. 20 tablet 0  . esomeprazole (NEXIUM) 20 MG capsule Take 1 capsule (20 mg total) by mouth daily as needed. For acid reflux. Take 1 hour prior to first meal of the day. 90 capsule 0  . fluticasone (FLONASE) 50 MCG/ACT nasal spray Place 2 sprays into both  nostrils daily. 16 g 6  . HYDROcodone-acetaminophen (NORCO) 5-325 MG tablet Take 1 tablet by mouth every 6 (six) hours as needed for moderate pain. 12 tablet 0  . metoprolol succinate (TOPROL-XL) 25 MG 24 hr tablet Take 0.5 tablets (12.5 mg total) by mouth daily. 30 tablet 9  . nitroGLYCERIN (NITROSTAT) 0.4 MG SL tablet Place 1 tablet (0.4 mg total) under the tongue every 5 (five) minutes as needed for chest pain. 25 tablet 3  . rosuvastatin (CRESTOR) 40 MG tablet Take 0.5 tablets (20 mg total) by mouth daily. 45 tablet 3  . tadalafil (CIALIS) 20 MG tablet Take 0.5-1 tablets (10-20 mg total) by mouth every other day as needed for erectile dysfunction. 10 tablet 5  . traMADol (ULTRAM) 50 MG tablet Take 50 mg by mouth every 8 (eight) hours as needed (pain).    Marland Kitchen zolpidem (AMBIEN) 10 MG tablet Take 1 tablet (10 mg total) by mouth at bedtime as needed. for sleep 30 tablet 5   No current facility-administered medications for this visit.   Chief complaint-noted No additions to family history Social history- patient is a non- smoker  Objective: BP 133/76 mmHg  Pulse 91  Temp(Src) 97.7 F (36.5 C) (Oral) Gen: NAD, alert, cooperative with exam CV: RRR, good S1/S2, no murmur, cap refill <3 Resp: CTABL, no wheezes, non-labored Skin: 2 cm wound on 4th digit of left foot, erythematous, warm, swollen, with pustulant drainage, first  marking outlines erythematous area, second line area that is slightly pink with increased warmth.       Assessment/Plan:   Cellulitis and abscess of toe of left foot Cellulitis: 3 day onset, has had 2 days of doxycycline. Received single dose 1g Ceftriaxone in the clinic today. Physical exam showing 2 cm wound on 4th digit of left foot, erythematous, warm, swollen, with pustulant drainage. Picture in note and media tab.  - Will continue with doxycycline BID  - Will give a shot of Ceftriaxone at this visit  - Patient to continue with rest and elevation of foot  -  Will follow up tomorrow to see if improvement, if worsening will consider hospitalization

## 2015-09-02 ENCOUNTER — Ambulatory Visit (INDEPENDENT_AMBULATORY_CARE_PROVIDER_SITE_OTHER): Payer: BLUE CROSS/BLUE SHIELD | Admitting: Family Medicine

## 2015-09-02 VITALS — BP 153/86 | HR 76 | Temp 97.5°F | Wt 202.0 lb

## 2015-09-02 DIAGNOSIS — L02619 Cutaneous abscess of unspecified foot: Secondary | ICD-10-CM | POA: Insufficient documentation

## 2015-09-02 DIAGNOSIS — L03119 Cellulitis of unspecified part of limb: Secondary | ICD-10-CM

## 2015-09-02 LAB — WOUND CULTURE
Gram Stain: NONE SEEN
Gram Stain: NONE SEEN

## 2015-09-02 MED ORDER — CEFTRIAXONE SODIUM 1 G IJ SOLR
1.0000 g | Freq: Once | INTRAMUSCULAR | Status: AC
  Administered 2015-09-02: 1 g via INTRAMUSCULAR

## 2015-09-02 NOTE — Telephone Encounter (Signed)
Pt seen for foot on 10/5 and 10/6 in office. Varsha Knock, Salome Spotted

## 2015-09-02 NOTE — Progress Notes (Signed)
   Subjective:    Patient ID: Jason Williams, male    DOB: 19-Mar-1961, 53 y.o.   MRN: [id number]  HPI Follow-ups can abscess and cellulitis on the left foot and specifically left fourth toe. Was seen in clinic yesterday and given Rocephin injection. He wants to avoid hospitalization if he could and he is here today for follow-up.  Pain is better. He thinks the redness is improved as well. He's had no fever, sweats, chills. He's noted no streaking or erythema in his leg.     Review of Systems See history of present illness above.    Objective:   Physical Exam  Vital signs are reviewed, afebrile. GEN.: Well-developed male no acute distress EXTREMITY: Left. Foot is less swollen than yesterday. The outlined areas of erythema are examined. The most proximal border shows the erythema resolved 9%. The distal border drawn by the marker continues to contain the brighter red erythema. Swelling is much improved. The warmth is improved. VASCULAR: Dorsalis pedis and posterior tibialis pulse 2+ left foot. SKIN: Ulcerated lesion on the dorsum of the fourth left toe, diameter the lesion is about 6 mm. Central area is oozing some serosanguineous fluid. NEURO: Sensation intact left foot and specifically left fourth toe.      Assessment & Plan:

## 2015-09-02 NOTE — Assessment & Plan Note (Addendum)
Yesterday seen and evaluated here withl ittle improvement but on antibiotics < 48 hours. We gave him 500 mg Rocephin in an attempt to prevent unnecessary hospitalization. He returns today with some improvement. Specifically his pain is improved and the erythema has improved. He's not had any fever. He would like very much to stay out of the hospital so we discussed at length. We will give him another Rocephin shot (today1000 mg), continue oral doxycycline  twice a day , elevate, relative rest ,, follow-up tomorrow morning. We have placed a photo in the chart and Dr. Bonner Puna was also evaluated and will see him tomorrow in conjunction with Dr. Sherril Cong. Td was in 2010. Not considered a dirty wound.

## 2015-09-02 NOTE — Patient Instructions (Signed)
Your foot looks a tiny bit better today. It certainly has not gotten worse. I think it's very reasonable to give you another injectable antibiotic today, have you continue the oral anabiotic twice daily at home, and follow-up tomorrow for recheck. If you develope fever, sweats or chills tonight or your pain got significantly worse or the redness started expanding and moving further up your foot or leg, then I would recommend you proceed to the emergency room.

## 2015-09-02 NOTE — Addendum Note (Signed)
Addended by: Derl Barrow on: 09/02/2015 03:11 PM   Modules accepted: Orders

## 2015-09-03 ENCOUNTER — Ambulatory Visit (INDEPENDENT_AMBULATORY_CARE_PROVIDER_SITE_OTHER): Payer: BLUE CROSS/BLUE SHIELD | Admitting: Family Medicine

## 2015-09-03 VITALS — BP 128/75 | HR 90 | Temp 97.9°F

## 2015-09-03 DIAGNOSIS — M79672 Pain in left foot: Secondary | ICD-10-CM

## 2015-09-03 DIAGNOSIS — L02612 Cutaneous abscess of left foot: Secondary | ICD-10-CM

## 2015-09-03 DIAGNOSIS — L03032 Cellulitis of left toe: Secondary | ICD-10-CM | POA: Diagnosis not present

## 2015-09-03 MED ORDER — CLINDAMYCIN HCL 300 MG PO CAPS: 300.0000 mg | ORAL_CAPSULE | Freq: Four times a day (QID) | ORAL | Status: DC

## 2015-09-03 MED ORDER — CEFTRIAXONE SODIUM 1 G IJ SOLR
1.0000 g | Freq: Once | INTRAMUSCULAR | Status: AC
  Administered 2015-09-03: 1 g via INTRAMUSCULAR

## 2015-09-03 MED ORDER — HYDROCODONE-ACETAMINOPHEN 5-325 MG PO TABS: 1.0000 | ORAL_TABLET | Freq: Four times a day (QID) | ORAL | Status: DC | PRN

## 2015-09-04 ENCOUNTER — Encounter: Payer: Self-pay | Admitting: Family Medicine

## 2015-09-04 NOTE — Assessment & Plan Note (Signed)
Now on day 5 of doxycycline, mild improvement in swelling and erythema stable, still having pain - repeat ctx in clinic - switch to clindamycin giving patient does not think doxy made any difference - discussed admission for vanc, will continue to try to manage outpatient, patient agrees to come to ED if he develops fever or spreading or streaking over the weekend - f/u with Dr. Lonny Prude Monday

## 2015-09-04 NOTE — Progress Notes (Signed)
   Subjective:    Patient ID: Jason Williams, male    DOB: [birth year], 54 y.o.   MRN: [id number]  HPI Follow-ups can abscess and cellulitis on the left foot and specifically left fourth toe. Was seen in clinic 10/5 and 10/6 and given Rocephin injections. He wants to avoid hospitalization if possible and he is here today for follow-up.  Pain is severe today since he ran out of vicodin. He thinks the swelling is better but the redness is the same. He's had no fever, sweats, chills. He's noted no streaking or erythema in his leg.   Review of Systems See history of present illness above.    Objective:   Physical Exam  Vital signs are reviewed, afebrile. GEN.: Well-developed male no acute distress EXTREMITY: Left. Foot is less swollen than yesterday. The outlined areas of erythema are examined. The most proximal border shows the erythema completely resolved. The distal border drawn by the marker continues to contain the erythema. Swelling is much improved. The warmth is improved. VASCULAR: Dorsalis pedis and posterior tibialis pulse 2+ left foot. SKIN: Ulcerated lesion on the dorsum of the fourth left toe, diameter the lesion is about 6 mm. Central area is oozing some serosanguineous fluid. NEURO: Sensation intact left foot and specifically left fourth toe.        Assessment & Plan:  Cellulitis and abscess of toe of left foot Now on day 5 of doxycycline, mild improvement in swelling and erythema stable, still having pain - repeat ctx in clinic - switch to clindamycin giving patient does not think doxy made any difference - discussed admission for vanc, will continue to try to manage outpatient, patient agrees to come to ED if he develops fever or spreading or streaking over the weekend - f/u with Dr. Lonny Prude Monday

## 2015-09-06 ENCOUNTER — Ambulatory Visit: Payer: Self-pay | Admitting: Family Medicine

## 2015-09-16 ENCOUNTER — Other Ambulatory Visit: Payer: Self-pay | Admitting: Cardiovascular Disease

## 2015-09-16 ENCOUNTER — Other Ambulatory Visit: Payer: Self-pay

## 2015-09-16 DIAGNOSIS — R079 Chest pain, unspecified: Secondary | ICD-10-CM

## 2015-09-16 MED ORDER — METOPROLOL SUCCINATE ER 25 MG PO TB24: 12.5000 mg | ORAL_TABLET | Freq: Every day | ORAL | Status: DC

## 2015-10-13 ENCOUNTER — Ambulatory Visit (INDEPENDENT_AMBULATORY_CARE_PROVIDER_SITE_OTHER): Payer: BLUE CROSS/BLUE SHIELD | Admitting: Family Medicine

## 2015-10-13 VITALS — BP 142/78 | HR 82 | Temp 98.3°F | Ht 70.0 in | Wt 204.0 lb

## 2015-10-13 DIAGNOSIS — M79672 Pain in left foot: Secondary | ICD-10-CM

## 2015-10-13 DIAGNOSIS — Z0000 Encounter for general adult medical examination without abnormal findings: Secondary | ICD-10-CM

## 2015-10-13 DIAGNOSIS — F411 Generalized anxiety disorder: Secondary | ICD-10-CM | POA: Diagnosis not present

## 2015-10-13 DIAGNOSIS — M79671 Pain in right foot: Secondary | ICD-10-CM

## 2015-10-13 DIAGNOSIS — I10 Essential (primary) hypertension: Secondary | ICD-10-CM | POA: Diagnosis not present

## 2015-10-13 MED ORDER — TRAMADOL HCL 50 MG PO TABS: 50.0000 mg | ORAL_TABLET | Freq: Three times a day (TID) | ORAL | Status: DC | PRN

## 2015-10-13 MED ORDER — DIAZEPAM 10 MG PO TABS: 10.0000 mg | ORAL_TABLET | Freq: Three times a day (TID) | ORAL | Status: DC

## 2015-10-13 MED ORDER — ZOLPIDEM TARTRATE 10 MG PO TABS: 10.0000 mg | ORAL_TABLET | Freq: Every evening | ORAL | Status: DC | PRN

## 2015-10-13 NOTE — Progress Notes (Signed)
Subjective    Jason Williams is a 54 y.o. male that presents for yearly physical exam.   Concerns:   1. Feet pain: Symptoms described as achy feet. Symptoms started years ago. He has previously been diagnosed with plantar fasciitis and has previously surgery. He has been using two Dr. Zoe Lan insoles to help with pain, which helps somewhat.   Goals    None      Past Medical History  Diagnosis Date  . Anxiety   . Depression   . GERD (gastroesophageal reflux disease)   . Hyperlipidemia   . Hypertension     Past Surgical History  Procedure Laterality Date  . Appendectomy  1981  . Ankle re construction  2011    bilateral    Current Outpatient Prescriptions on File Prior to Visit  Medication Sig Dispense Refill  . amLODipine (NORVASC) 10 MG tablet Take 1 tablet (10 mg total) by mouth daily. 90 tablet 3  . aspirin ([facility] ASPIRIN CHILD) 81 MG chewable tablet TAKE 1 TABLET BY MOUTH DAILY TO THIN BLOOD TO HELP TO PREVENT HEART ATTACK AND STROKE    . clindamycin (CLEOCIN) 300 MG capsule Take 1 capsule (300 mg total) by mouth 4 (four) times daily. 28 capsule 0  . diazepam (VALIUM) 10 MG tablet Take 1 tablet (10 mg total) by mouth 3 (three) times daily. 90 tablet 5  . doxycycline (VIBRA-TABS) 100 MG tablet Take 1 tablet (100 mg total) by mouth 2 (two) times daily. 20 tablet 0  . esomeprazole (NEXIUM) 20 MG capsule Take 1 capsule (20 mg total) by mouth daily as needed. For acid reflux. Take 1 hour prior to first meal of the day. 90 capsule 0  . fluticasone (FLONASE) 50 MCG/ACT nasal spray Place 2 sprays into both nostrils daily. 16 g 6  . HYDROcodone-acetaminophen (NORCO) 5-325 MG tablet Take 1 tablet by mouth every 6 (six) hours as needed for moderate pain. 20 tablet 0  . metoprolol succinate (TOPROL-XL) 25 MG 24 hr tablet Take 0.5 tablets (12.5 mg total) by mouth daily. 45 tablet 2  . nitroGLYCERIN (NITROSTAT) 0.4 MG SL tablet Place 1 tablet (0.4 mg total) under the tongue every 5  (five) minutes as needed for chest pain. 25 tablet 3  . rosuvastatin (CRESTOR) 40 MG tablet Take 0.5 tablets (20 mg total) by mouth daily. 45 tablet 3  . tadalafil (CIALIS) 20 MG tablet Take 0.5-1 tablets (10-20 mg total) by mouth every other day as needed for erectile dysfunction. 10 tablet 5  . traMADol (ULTRAM) 50 MG tablet Take 50 mg by mouth every 8 (eight) hours as needed (pain).    Marland Kitchen zolpidem (AMBIEN) 10 MG tablet Take 1 tablet (10 mg total) by mouth at bedtime as needed. for sleep 30 tablet 5   No current facility-administered medications on file prior to visit.    No Known Allergies  Social History   Social History  . Marital Status: Single    Spouse Name: N/A  . Number of Children: 1  . Years of Education: N/A   Occupational History  . Engineer    Social History Main Topics  . Smoking status: Never Smoker   . Smokeless tobacco: Never Used  . Alcohol Use: 1.2 oz/week    2 Glasses of wine per week  . Drug Use: No  . Sexual Activity: Yes    Birth Control/ Protection: Pill, None     Comment: 1 male   Other Topics Concern  .  Not on file   Social History Narrative    Family History  Problem Relation Age of Onset  . Esophageal cancer Mother   . Heart disease Maternal Grandfather     MI/CABG  . Heart attack Maternal Grandfather   . Heart attack Father     Died of MI at 41  . Healthy Sister     ROS  Per HPI   Objective   BP 142/78 mmHg  Pulse 82  Temp(Src) 98.3 F (36.8 C) (Oral)  Ht 5\' 10"  (1.778 m)  Wt 204 lb (92.534 kg)  BMI 29.27 kg/m2  General: Well appearing, no distress HEENT: Pupils equal and reactive to light/accomodation. Extraocular movements intact bilaterally. Tympanic membranes normal bilaterally. Nares patent bilaterally. Oropharnx clear and moist. No cervical adenopathy bilaterally Respiratory/Chest: Clear to auscultation bilaterally Cardiovascular: Regular rate and rhythm Gastrointestinal: Soft, non-tender,  non-distended Genitourinary: Not examined    Musculoskeletal: Normal tone and bulk. Bilateral feet with high arches. No tenderness along plantar fascia or insertion point of plantar fascia Neuro: Alert, oriented Dermatologic: No obvious rashes Psychiatric: Full affect  Meds ordered this encounter  Medications  . traMADol (ULTRAM) 50 MG tablet    Sig: Take 1 tablet (50 mg total) by mouth every 8 (eight) hours as needed (pain).    Dispense:  30 tablet    Refill:  5  . zolpidem (AMBIEN) 10 MG tablet    Sig: Take 1 tablet (10 mg total) by mouth at bedtime as needed. for sleep    Dispense:  30 tablet    Refill:  5  . diazepam (VALIUM) 10 MG tablet    Sig: Take 1 tablet (10 mg total) by mouth 3 (three) times daily.    Dispense:  90 tablet    Refill:  5    Assessment and Plan    Health Maintenance Due  Topic Date Due  . Hepatitis C Screening  03/05/1961  . HIV Screening  05/27/1976  . INFLUENZA VACCINE  06/28/2015   54 y.o. male for healthcare maintenance visit - labs to be obtained at future visit  Bilateral foot pain: likely related to high arches. Discussed options. Patient willing to try new insoles to help with arches. Will refer to sports medicine for orthotics.

## 2015-10-13 NOTE — Patient Instructions (Addendum)
Thank you for coming to see me today. It was a pleasure. Today we talked about:   Foot pain: I will refer you to Sports Medicine to get fitted for orthotics.  Please make an appointment to see me in 3 months for follow-up.  If you have any questions or concerns, please do not hesitate to call the office at [phone].   Sincerely,  Cordelia Poche, MD

## 2015-10-25 ENCOUNTER — Ambulatory Visit (INDEPENDENT_AMBULATORY_CARE_PROVIDER_SITE_OTHER): Payer: BLUE CROSS/BLUE SHIELD | Admitting: Family Medicine

## 2015-10-25 DIAGNOSIS — M79671 Pain in right foot: Secondary | ICD-10-CM

## 2015-10-25 DIAGNOSIS — M79672 Pain in left foot: Secondary | ICD-10-CM

## 2015-10-25 NOTE — Patient Instructions (Signed)
These are the usual plantar fascia instructions: Take tylenol or aleve as needed for pain  Plantar fascia stretch for 20-30 seconds (do 3 of these) in morning Lowering/raise on a step exercises 3 x 10 once or twice a day - this is very important for long term recovery. Can add heel walks, toe walks forward and backward as well Ice heel for 15 minutes as needed. Avoid flat shoes/barefoot walking as much as possible. Arch straps have been shown to help with pain - wear when up and walking around if they do. Consider spencos or the comforthotics from [place]. If you get a chance call and set up a brief appointment with me (no charge) to take a look at your old orthotics and make changes if necessary.

## 2015-10-26 DIAGNOSIS — M79672 Pain in left foot: Principal | ICD-10-CM

## 2015-10-26 DIAGNOSIS — M79671 Pain in right foot: Secondary | ICD-10-CM | POA: Insufficient documentation

## 2015-10-26 NOTE — Progress Notes (Signed)
Patient was referred by Dr. Lonny Prude for orthotics.  Jason Williams has history of bilateral ankle reconstructions and has had problems with plantar fasciitis.  He reports having our orthotics made about 2 years ago by Dr. Nori Riis - states pain worsened with these and is not interested in new orthotics.  Advised he can bring by those orthotics (he still has these) for me to evaluate them and see if any adjustments need to be made.  We also reviewed treatment for plantar fasciitis and metatarsalgia.

## 2015-10-29 ENCOUNTER — Telehealth: Payer: Self-pay | Admitting: Family Medicine

## 2015-10-29 NOTE — Telephone Encounter (Signed)
Pt called because he had previously discussed get a prescription of Viagra. He has decided that he would like to try this. Can we call this in. jw

## 2015-11-02 MED ORDER — SILDENAFIL CITRATE 100 MG PO TABS: 50.0000 mg | ORAL_TABLET | Freq: Every day | ORAL | Status: DC | PRN

## 2015-11-02 NOTE — Telephone Encounter (Signed)
Will refill Viagra. Please remind patient that he should NOT take nitroglycerin if he has taken Viagra.

## 2015-11-02 NOTE — Telephone Encounter (Signed)
Called pt. Informed him the rx will be sent in and he should not take nitroglycerin if he has taken the Viagra. Patient understood. Ottis Stain, CMA

## 2015-11-03 ENCOUNTER — Telehealth: Payer: Self-pay | Admitting: *Deleted

## 2015-11-03 NOTE — Telephone Encounter (Signed)
[facility] Authorization [facility] from [facility] for [facility]. PA completed online at [email or web address].  PA was approved via [facility]  From 10/04/15-11/02/2016.  PA number [employer]: [id number]. Derl Barrow, RN

## 2015-11-04 ENCOUNTER — Ambulatory Visit (INDEPENDENT_AMBULATORY_CARE_PROVIDER_SITE_OTHER): Payer: BLUE CROSS/BLUE SHIELD | Admitting: Family Medicine

## 2015-11-04 VITALS — BP 149/86 | HR 78 | Temp 98.3°F | Ht 70.0 in | Wt 197.0 lb

## 2015-11-04 DIAGNOSIS — N529 Male erectile dysfunction, unspecified: Secondary | ICD-10-CM | POA: Diagnosis not present

## 2015-11-04 MED ORDER — SILDENAFIL CITRATE 100 MG PO TABS: 100.0000 mg | ORAL_TABLET | Freq: Every day | ORAL | Status: DC | PRN

## 2015-11-04 NOTE — Patient Instructions (Signed)
Thank you for coming to see me today. It was a pleasure. Today we talked about:   ED: I will refer you to a urologist  If you have any questions or concerns, please do not hesitate to call the office at [phone].  Sincerely,  Cordelia Poche, MD

## 2015-11-04 NOTE — Telephone Encounter (Signed)
Yes, I know about the drug to drug interaction. Patient has been on this before and knows not to use medications together. Also asked nursing staff to reiterate this to patient when refill request placed.

## 2015-11-04 NOTE — Progress Notes (Signed)
    Subjective    Jason Williams is a 55 y.o. male that presents for a follow-up visit for chronic issues.   1. Erectile dysfunction: This is a chronic issue. Patient states that Viagra is not working as well as previously. He is having to use 100mg  tablets and is having side effects of headache. He is  nterested in Alprostadil injections. Discussed that it will be best to refer to urology if he is interested in this treatment option.   Social History  Substance Use Topics  . Smoking status: Never Smoker   . Smokeless tobacco: Never Used  . Alcohol Use: 1.2 oz/week    2 Glasses of wine per week    No Known Allergies  No orders of the defined types were placed in this encounter.    ROS  Per HPI   Objective   BP 149/86 mmHg  Pulse 78  Temp(Src) 98.3 F (36.8 C) (Oral)  Ht 5\' 10"  (1.778 m)  Wt 197 lb (89.359 kg)  BMI 28.27 kg/m2  General: Well appearing, no distress  Assessment and Plan    ED Refill Sildenafil 100mg  tabs. Referral to urology

## 2015-11-04 NOTE — Assessment & Plan Note (Signed)
Refill Sildenafil 100mg  tabs. Referral to urology

## 2015-11-04 NOTE — Telephone Encounter (Signed)
Received call from Pharmacist, Maudie Mercury at [facility] stating that viagra has a drug to drug interaction with nitrostat. Will forward to PCP for review. Velora Heckler, RN

## 2015-12-21 ENCOUNTER — Telehealth: Payer: Self-pay | Admitting: *Deleted

## 2015-12-21 ENCOUNTER — Ambulatory Visit (INDEPENDENT_AMBULATORY_CARE_PROVIDER_SITE_OTHER): Payer: BLUE CROSS/BLUE SHIELD | Admitting: *Deleted

## 2015-12-21 DIAGNOSIS — Z23 Encounter for immunization: Secondary | ICD-10-CM

## 2015-12-21 NOTE — Telephone Encounter (Signed)
Called patient to offer flu vaccine. Scheduled patient to receive flu vaccine today at 2 PM. Lewis Keats, Orvis Brill, RN

## 2016-03-31 ENCOUNTER — Encounter: Payer: Self-pay | Admitting: Family Medicine

## 2016-03-31 ENCOUNTER — Ambulatory Visit (INDEPENDENT_AMBULATORY_CARE_PROVIDER_SITE_OTHER): Payer: BLUE CROSS/BLUE SHIELD | Admitting: Family Medicine

## 2016-03-31 VITALS — BP 142/82 | HR 78 | Temp 98.1°F | Ht 70.0 in | Wt 200.7 lb

## 2016-03-31 DIAGNOSIS — N400 Enlarged prostate without lower urinary tract symptoms: Secondary | ICD-10-CM | POA: Diagnosis not present

## 2016-03-31 DIAGNOSIS — F411 Generalized anxiety disorder: Secondary | ICD-10-CM

## 2016-03-31 DIAGNOSIS — I10 Essential (primary) hypertension: Secondary | ICD-10-CM | POA: Diagnosis not present

## 2016-03-31 DIAGNOSIS — Z125 Encounter for screening for malignant neoplasm of prostate: Secondary | ICD-10-CM | POA: Diagnosis not present

## 2016-03-31 LAB — CBC WITH DIFFERENTIAL/PLATELET
BASOS PCT: 0 %
Basophils Absolute: 0 cells/uL (ref 0–200)
EOS PCT: 3 %
Eosinophils Absolute: 162 cells/uL (ref 15–500)
HCT: 43.9 % (ref 38.5–50.0)
HEMOGLOBIN: 15.1 g/dL (ref 13.2–17.1)
LYMPHS ABS: 1458 {cells}/uL (ref 850–3900)
Lymphocytes Relative: 27 %
MCH: 31.5 pg (ref 27.0–33.0)
MCHC: 34.4 g/dL (ref 32.0–36.0)
MCV: 91.5 fL (ref 80.0–100.0)
MONO ABS: 540 {cells}/uL (ref 200–950)
MPV: 8.9 fL (ref 7.5–12.5)
Monocytes Relative: 10 %
NEUTROS PCT: 60 %
Neutro Abs: 3240 cells/uL (ref 1500–7800)
Platelets: 242 10*3/uL (ref 140–400)
RBC: 4.8 MIL/uL (ref 4.20–5.80)
RDW: 14.1 % (ref 11.0–15.0)
WBC: 5.4 10*3/uL (ref 3.8–10.8)

## 2016-03-31 LAB — COMPLETE METABOLIC PANEL WITH GFR
ALBUMIN: 4.5 g/dL (ref 3.6–5.1)
ALT: 27 U/L (ref 9–46)
AST: 19 U/L (ref 10–35)
Alkaline Phosphatase: 60 U/L (ref 40–115)
BUN: 18 mg/dL (ref 7–25)
CHLORIDE: 103 mmol/L (ref 98–110)
CO2: 24 mmol/L (ref 20–31)
CREATININE: 0.93 mg/dL (ref 0.70–1.33)
Calcium: 9.5 mg/dL (ref 8.6–10.3)
GFR, Est African American: >89 mL/min (ref >=60)
GFR, Est Non African American: >89 mL/min (ref >=60)
GLUCOSE: 96 mg/dL (ref 65–99)
Potassium: 4.9 mmol/L (ref 3.5–5.3)
SODIUM: 139 mmol/L (ref 135–146)
Total Bilirubin: 0.5 mg/dL (ref 0.2–1.2)
Total Protein: 7.3 g/dL (ref 6.1–8.1)

## 2016-03-31 LAB — TSH: TSH: 2.58 m[IU]/L (ref 0.40–4.50)

## 2016-03-31 LAB — LIPID PANEL
CHOL/HDL RATIO: 5.2 ratio — AB (ref <=5.0)
Cholesterol: 223 mg/dL — ABNORMAL HIGH (ref 125–200)
HDL: 43 mg/dL (ref >=40)
LDL Cholesterol: 160 mg/dL — ABNORMAL HIGH (ref <130)
Triglycerides: 99 mg/dL (ref <150)
VLDL: 20 mg/dL (ref <30)

## 2016-03-31 MED ORDER — DIAZEPAM 10 MG PO TABS: 10.0000 mg | ORAL_TABLET | Freq: Three times a day (TID) | ORAL | Status: DC

## 2016-03-31 MED ORDER — ZOLPIDEM TARTRATE 10 MG PO TABS: 10.0000 mg | ORAL_TABLET | Freq: Every evening | ORAL | Status: DC | PRN

## 2016-03-31 MED ORDER — TRAMADOL HCL 50 MG PO TABS: 50.0000 mg | ORAL_TABLET | Freq: Three times a day (TID) | ORAL | Status: DC | PRN

## 2016-03-31 MED ORDER — TAMSULOSIN HCL 0.4 MG PO CAPS: 0.4000 mg | ORAL_CAPSULE | Freq: Every day | ORAL | Status: DC

## 2016-03-31 NOTE — Progress Notes (Signed)
    Subjective    Jason Williams is a 55 y.o. male that presents for a follow-up visit for:   1. Urinary issues: He reports increased hesitancy and low stream. He has no dysuria but does reports polyuria. He generally wakes up a few times per night to urinate.  2. Chronic ankle pain: Tramadol continues to allow good function. No complaints  3. Anxiety: Patient states he is managed will with diazepam. No side effects  4. Hypertension: patient adherent with amlodipine 10mg  daily. No chest pain or dyspnea.  Social History  Substance Use Topics  . Smoking status: Never Smoker   . Smokeless tobacco: Never Used  . Alcohol Use: 1.2 oz/week    2 Glasses of wine per week    No Known Allergies  No orders of the defined types were placed in this encounter.    ROS  Per HPI  Objective   BP 142/82 mmHg  Pulse 78  Temp(Src) 98.1 F (36.7 C) (Oral)  Ht 5\' 10"  (1.778 m)  Wt 200 lb 11.2 oz (91.037 kg)  BMI 28.80 kg/m2  Vital signs reviewed  General: Well appearing, no distress Genitourinary: Prostate is smooth and enlarged. No tenderness. External hemorrhoids visualized.  Assessment and Plan    BPH (benign prostatic hyperplasia) Will start on Flomax to help with increased nighttime urination  Hypertension Slightly elevated today. Will recheck at next visit. Continue amldoipine.  Anxiety state Controlled. Continue current regimen.

## 2016-03-31 NOTE — Patient Instructions (Addendum)
Thank you for coming to see me today. It was a pleasure. Today we talked about:   BPH: this is an enlarged prostate. I am starting you on Flomax. Take this daily.  Hypertension: No changes today  Labs: I am getting lab work today. Expect a letter in the mail.  It was a pleasure taking care of you and getting to know you. I have genuinely enjoyed it. I hope to see you around, Jason Williams.   Please make an appointment to see your new doctor in 6 months.  If you have any questions or concerns, please do not hesitate to call the office at [phone].  Sincerely,  Cordelia Poche, MD   Tamsulosin capsules What is this medicine? TAMSULOSIN (tam SOO loe sin) is used to treat enlargement of the prostate gland in men, a condition called benign prostatic hyperplasia or BPH. It is not for use in women. It works by relaxing muscles in the prostate and bladder neck. This improves urine flow and reduces BPH symptoms. This medicine may be used for other purposes; ask your health care provider or pharmacist if you have questions. What should I tell my health care provider before I take this medicine? They need to know if you have any of the following conditions: -advanced kidney disease -advanced liver disease -low blood pressure -prostate cancer -an unusual or allergic reaction to tamsulosin, sulfa drugs, other medicines, foods, dyes, or preservatives -pregnant or trying to get pregnant -breast-feeding How should I use this medicine? Take this medicine by mouth about 30 minutes after the same meal every day. Follow the directions on the prescription label. Swallow the capsules whole with a glass of water. Do not crush, chew, or open capsules. Do not take your medicine more often than directed. Do not stop taking your medicine unless your doctor tells you to. Talk to your pediatrician regarding the use of this medicine in children. Special care may be needed. Overdosage: If you think you have  taken too much of this medicine contact a poison control center or emergency room at once. NOTE: This medicine is only for you. Do not share this medicine with others. What if I miss a dose? If you miss a dose, take it as soon as you can. If it is almost time for your next dose, take only that dose. Do not take double or extra doses. If you stop taking your medicine for several days or more, ask your doctor or health care professional what dose you should start back on. What may interact with this medicine? -cimetidine -fluoxetine -ketoconazole -medicines for erectile disfunction like sildenafil, tadalafil, vardenafil -medicines for high blood pressure -other alpha-blockers like alfuzosin, doxazosin, phentolamine, phenoxybenzamine, prazosin, terazosin -warfarin This list may not describe all possible interactions. Give your health care provider a list of all the medicines, herbs, non-prescription drugs, or dietary supplements you use. Also tell them if you smoke, drink alcohol, or use illegal drugs. Some items may interact with your medicine. What should I watch for while using this medicine? Visit your doctor or health care professional for regular check ups. You will need lab work done before you start this medicine and regularly while you are taking it. Check your blood pressure as directed. Ask your health care professional what your blood pressure should be, and when you should contact him or her. This medicine may make you feel dizzy or lightheaded. This is more likely to happen after the first dose, after an increase in dose, or  during hot weather or exercise. Drinking alcohol and taking some medicines can make this worse. Do not drive, use machinery, or do anything that needs mental alertness until you know how this medicine affects you. Do not sit or stand up quickly. If you begin to feel dizzy, sit down until you feel better. These effects can decrease once your body adjusts to the  medicine. Contact your doctor or health care professional right away if you have an erection that lasts longer than 4 hours or if it becomes painful. This may be a sign of a serious problem and must be treated right away to prevent permanent damage. If you are thinking of having cataract surgery, tell your eye surgeon that you have taken this medicine. What side effects may I notice from receiving this medicine? Side effects that you should report to your doctor or health care professional as soon as possible: -allergic reactions like skin rash or itching, hives, swelling of the lips, mouth, tongue, or throat -breathing problems -change in vision -feeling faint or lightheaded -irregular heartbeat -prolonged or painful erection -weakness Side effects that usually do not require medical attention (report to your doctor or health care professional if they continue or are bothersome): -back pain -change in sex drive or performance -constipation, nausea or vomiting -cough -drowsy -runny or stuffy nose -trouble sleeping This list may not describe all possible side effects. Call your doctor for medical advice about side effects. You may report side effects to FDA at 1-800-FDA-1088. Where should I keep my medicine? Keep out of the reach of children. Store at room temperature between 15 and 30 degrees C (59 and 86 degrees F). Throw away any unused medicine after the expiration date. NOTE: This sheet is a summary. It may not cover all possible information. If you have questions about this medicine, talk to your doctor, pharmacist, or health care provider.    2016, Elsevier/Gold Standard. (2012-11-13 14:11:34)

## 2016-04-01 LAB — PSA: PSA: 3.27 ng/mL (ref <=4.00)

## 2016-04-03 ENCOUNTER — Encounter: Payer: Self-pay | Admitting: Family Medicine

## 2016-04-03 NOTE — Assessment & Plan Note (Signed)
Will start on Flomax to help with increased nighttime urination

## 2016-04-03 NOTE — Assessment & Plan Note (Signed)
Controlled. Continue current regimen. 

## 2016-04-03 NOTE — Assessment & Plan Note (Signed)
Slightly elevated today. Will recheck at next visit. Continue amldoipine.

## 2016-04-04 ENCOUNTER — Other Ambulatory Visit: Payer: Self-pay | Admitting: *Deleted

## 2016-04-04 MED ORDER — ROSUVASTATIN CALCIUM 40 MG PO TABS: 20.0000 mg | ORAL_TABLET | Freq: Every day | ORAL | Status: DC

## 2016-04-14 ENCOUNTER — Ambulatory Visit: Payer: Self-pay | Admitting: Family Medicine

## 2016-08-07 ENCOUNTER — Other Ambulatory Visit: Payer: Self-pay | Admitting: *Deleted

## 2016-08-07 MED ORDER — ROSUVASTATIN CALCIUM 40 MG PO TABS: 20.0000 mg | ORAL_TABLET | Freq: Every day | ORAL | 3 refills | Status: DC

## 2016-09-28 ENCOUNTER — Other Ambulatory Visit: Payer: Self-pay | Admitting: Internal Medicine

## 2016-09-28 NOTE — Telephone Encounter (Signed)
Pt asked me to call him before I went home to see if medication was called. Informed pt it was not, pt also asked if his new PCP was male or male. I replied male, he said he preferred male, he had a male doctor previously and assumed he would always have male provider. Pt would like PCP changed to a male PCP. Please advise. Thanks! ep

## 2016-09-28 NOTE — Telephone Encounter (Signed)
Pt needs a refill on Ambien. [facility] uses [facility] on [address] in [place]. Pt is out of this medication. Please advise. Thanks! ep

## 2016-09-29 MED ORDER — ZOLPIDEM TARTRATE 10 MG PO TABS: 10.0000 mg | ORAL_TABLET | Freq: Every evening | ORAL | 2 refills | Status: DC | PRN

## 2016-09-29 NOTE — Telephone Encounter (Signed)
Have called in Rx for Ambien to pharmacy.   Phill Myron, D.O. 09/29/2016, 2:30 PM PGY-2, [place]

## 2016-09-29 NOTE — Telephone Encounter (Signed)
Pt informed. Deniz Hannan T Ceaser Ebeling, CMA  

## 2016-09-29 NOTE — Telephone Encounter (Signed)
Pt called again about his refill on ambien.  He took his last one Wed night.  [facility] [address]

## 2016-10-04 ENCOUNTER — Other Ambulatory Visit: Payer: Self-pay | Admitting: Internal Medicine

## 2016-10-04 DIAGNOSIS — F411 Generalized anxiety disorder: Secondary | ICD-10-CM

## 2016-10-04 MED ORDER — DIAZEPAM 10 MG PO TABS: 10.0000 mg | ORAL_TABLET | Freq: Three times a day (TID) | ORAL | 5 refills | Status: DC

## 2016-10-04 MED ORDER — TRAMADOL HCL 50 MG PO TABS: 50.0000 mg | ORAL_TABLET | Freq: Three times a day (TID) | ORAL | 5 refills | Status: DC | PRN

## 2016-10-04 NOTE — Telephone Encounter (Signed)
Pt informed. Jason Williams, CMA  

## 2016-10-04 NOTE — Telephone Encounter (Signed)
Please call patient and let him know Valium and Tramadol Rx are ready for pick up behind the front desk.   Phill Myron, D.O. 10/04/2016, 4:36 PM PGY-2, [place]

## 2016-10-04 NOTE — Telephone Encounter (Signed)
Needs prescription refilled: diazepam and tramadol. Please call when ready for pickup.

## 2016-11-06 ENCOUNTER — Encounter: Payer: Self-pay | Admitting: *Deleted

## 2016-11-10 ENCOUNTER — Other Ambulatory Visit: Payer: Self-pay | Admitting: Cardiology

## 2016-11-13 NOTE — Telephone Encounter (Signed)
Please advise on refill request as per last office visit patient is to follow up prn. Thanks, MI

## 2016-11-13 NOTE — Telephone Encounter (Signed)
Please forward to primary care.  Pt is followed at [facility] family practice center.

## 2016-11-14 ENCOUNTER — Other Ambulatory Visit: Payer: Self-pay | Admitting: Cardiology

## 2016-12-07 ENCOUNTER — Encounter: Payer: Self-pay | Admitting: Internal Medicine

## 2016-12-19 ENCOUNTER — Ambulatory Visit: Payer: Self-pay | Admitting: Family Medicine

## 2016-12-20 ENCOUNTER — Telehealth: Payer: Self-pay | Admitting: Family Medicine

## 2016-12-20 NOTE — Telephone Encounter (Signed)
Pt called because to check what time his appointment was for today and was told that he missed his appointment since it was yesterday. He rescheduled for next Tuesday which the next available with his doctor. He will be out of his Ambien before then and would like to know if he can get enough of the medication to last until next Tuesday. jw

## 2016-12-21 NOTE — Telephone Encounter (Signed)
Called in Rx. 7 tabs, no refills. Ottis Stain, CMA

## 2016-12-21 NOTE — Telephone Encounter (Signed)
Checked on this with Kennyth Lose and we changed the PCP, however pt has appointment with you on 12/26/2016. If you can see him this time due to appointment being made before PCP change. Routing to Dr. Mingo Amber and PCP as an Juluis Rainier. Katharina Caper, April D, [place]

## 2016-12-21 NOTE — Telephone Encounter (Signed)
Yep that's no problem, I can see him.

## 2016-12-21 NOTE — Telephone Encounter (Signed)
Can someone please call this in for me?  Same dose #7 to last until appt next week.   Also, I'm not sure why I'm being assigned new patients as I'm leaving next summer.  It looks like Mr. Winstanley was previously a resident patient.

## 2016-12-26 ENCOUNTER — Encounter: Payer: Self-pay | Admitting: Family Medicine

## 2016-12-26 ENCOUNTER — Ambulatory Visit (INDEPENDENT_AMBULATORY_CARE_PROVIDER_SITE_OTHER): Payer: BLUE CROSS/BLUE SHIELD | Admitting: Family Medicine

## 2016-12-26 VITALS — BP 134/86 | HR 74 | Temp 97.9°F | Ht 70.0 in | Wt 203.2 lb

## 2016-12-26 DIAGNOSIS — I10 Essential (primary) hypertension: Secondary | ICD-10-CM | POA: Diagnosis not present

## 2016-12-26 DIAGNOSIS — K219 Gastro-esophageal reflux disease without esophagitis: Secondary | ICD-10-CM

## 2016-12-26 DIAGNOSIS — M25579 Pain in unspecified ankle and joints of unspecified foot: Secondary | ICD-10-CM

## 2016-12-26 DIAGNOSIS — F411 Generalized anxiety disorder: Secondary | ICD-10-CM | POA: Diagnosis not present

## 2016-12-26 DIAGNOSIS — Z23 Encounter for immunization: Secondary | ICD-10-CM | POA: Diagnosis not present

## 2016-12-26 DIAGNOSIS — E785 Hyperlipidemia, unspecified: Secondary | ICD-10-CM

## 2016-12-26 DIAGNOSIS — G4700 Insomnia, unspecified: Secondary | ICD-10-CM

## 2016-12-26 MED ORDER — AMLODIPINE BESYLATE 10 MG PO TABS: 10.0000 mg | ORAL_TABLET | Freq: Every day | ORAL | 2 refills | Status: DC

## 2016-12-26 MED ORDER — ZOLPIDEM TARTRATE 10 MG PO TABS: 10.0000 mg | ORAL_TABLET | Freq: Every evening | ORAL | 5 refills | Status: DC | PRN

## 2016-12-26 MED ORDER — TAMSULOSIN HCL 0.4 MG PO CAPS: 0.4000 mg | ORAL_CAPSULE | Freq: Every day | ORAL | 3 refills | Status: DC

## 2016-12-26 MED ORDER — DIAZEPAM 10 MG PO TABS: 10.0000 mg | ORAL_TABLET | Freq: Three times a day (TID) | ORAL | 5 refills | Status: DC

## 2016-12-26 MED ORDER — TRAMADOL HCL 50 MG PO TABS: 50.0000 mg | ORAL_TABLET | Freq: Three times a day (TID) | ORAL | 5 refills | Status: DC | PRN

## 2016-12-26 MED ORDER — SIMVASTATIN 20 MG PO TABS: 20.0000 mg | ORAL_TABLET | Freq: Every day | ORAL | 3 refills | Status: DC

## 2016-12-26 NOTE — Assessment & Plan Note (Signed)
Patient has been on diazepam since at least 2010. I provided refills for him today. No red flags. His anxiety is well controlled. Feels well today.

## 2016-12-26 NOTE — Assessment & Plan Note (Signed)
Refill for Tramadol today.  No concerns for abuse or diversion. Uses this to help with standing all day at work.

## 2016-12-26 NOTE — Assessment & Plan Note (Addendum)
Controlled with Ambien.  Has been on this for years as well in past.  No concern for abuse/diversion.   Refill provided today. Will discuss decreasing high risk medications at next visit with me.  Meeting patient for first time today.

## 2016-12-26 NOTE — Assessment & Plan Note (Signed)
Elevated diastolic today. He has been out of his Norvasc. I provided him a refill for this today. He needs labs but these won't be due until May. Discussed this with him and he would like to have these done fasting. I will put in a future order.

## 2016-12-26 NOTE — Progress Notes (Signed)
Subjective:    Jason Williams is a 56 y.o. male who presents to [facility] today for HTN:  1.  Hypertension:  Long-term problem for this patient.  No adverse effects from medication.  Not checking it regularly.  No HA, CP, dizziness, shortness of breath, palpitations, or LE swelling.   BP Readings from Last 3 Encounters:  12/26/16 134/86  03/31/16 (!) 142/82  11/04/15 (!) 149/86   2.  Anxiety:  Chronic issue for the patient for which she has been on diazepam for the past several years. He has been tried on SSRIs the past but has not tolerated these well. Controlled with his diazepam. No suicidal or homicidal ideation. No depressive symptoms.  3.  Chronic ankle pain:  Ongoing issue for the past 4 years. Patient had echo reconstruction surgery of both ankles. He works on his feet all day on concrete floors. His fitbit reports that he walks about 17,000 steps a day. He is miserable if he does not have tramadol for pain relief. Pain is better on weekends or days he does not work.   Prev health:  Currently overdue for flu shot -- given today.  .  ROS as above per HPI.    The following portions of the patient's history were reviewed and updated as appropriate: allergies, current medications, past medical history, family and social history, and problem list. Patient is a nonsmoker.    PMH reviewed.  Past Medical History:  Diagnosis Date  . Anxiety   . Depression   . GERD (gastroesophageal reflux disease)   . Hyperlipidemia   . Hypertension    Past Surgical History:  Procedure Laterality Date  . ankle re construction  2011   bilateral  . APPENDECTOMY  1981    Medications reviewed.    Objective:   Physical Exam BP 134/86   Pulse 74   Temp 97.9 F (36.6 C) (Oral)   Ht 5\' 10"  (1.778 m)   Wt 203 lb 3.2 oz (92.2 kg)   SpO2 98%   BMI 29.16 kg/m  Gen:  Alert, cooperative patient who appears stated age in no acute distress.  Vital signs reviewed. HEENT: EOMI,  MMM. PERRL Cardiac:   Regular rate and rhythm without murmur auscultated.  Good S1/S2. Pulm:  Clear to auscultation bilaterally with good air movement.  No wheezes or rales noted.   Abd:  Soft/nondistended/nontender.   MSK:  Ankles nontender to direct palpation to my exam today.  He is wearing both Dr. Felicie Morn and custom orthopedic inserts in his shoes bilaterally. Exts: Non edematous BL  LE, warm and well perfused.  Psych:  Not depressed or anxious appearing.  Linear and coherent thought process as evidenced by speech pattern. Smiles spontaneously.    No results found for this or any previous visit (from the past 72 hour(s)).

## 2016-12-26 NOTE — Assessment & Plan Note (Signed)
He was not able to tolerate 20 mg of Crestor. We are switching him to a much lower dose simvastatin today. This is for primary prevention as he does not have history of cardiovascular disease.

## 2016-12-26 NOTE — Patient Instructions (Addendum)
I have refilled all of your medicines today.  Stop all statins for the next 2 weeks or so.  At that point, start the simvastatin.  You should know about a month after that how you're going to do with it.  Come back in the next several months for a repeat of the blood work. Just call to make a lab appointment.  It was very good to meet you today!

## 2017-03-05 IMAGING — NM NM MYOCAR MULTI W/ SPECT
3 series · 18 of 18 positions shown · non-contrast
Comparison: none

[Series 1: stress_(id)_sa · 6.5mm · 6.51mm/px · 6 of 64 frames shown (1 of 2)]
[frame 6/64]
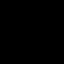
[frame 16/64]
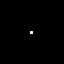
[frame 27/64]
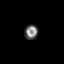
[frame 38/64]
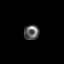
[frame 48/64]
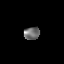
[frame 59/64]
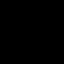

[Series 1: rest_(id)_sa · 6.5mm · 6.51mm/px · 6 of 64 frames shown]
[frame 6/64]
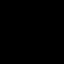
[frame 16/64]
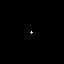
[frame 27/64]
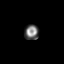
[frame 38/64]
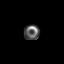
[frame 48/64]
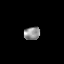
[frame 59/64]
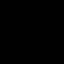

[Series 1: stress_(id)_sa · 6.5mm · 6.51mm/px · 6 of 512 frames shown (2 of 2)]
[frame 43/512]
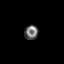
[frame 128/512]
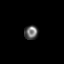
[frame 214/512]
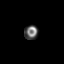
[frame 299/512]
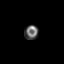
[frame 384/512]
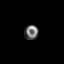
[frame 470/512]
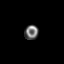

[18 of 18 positions shown; findings below may reference images not displayed]

Canned report from images found in remote index.

Refer to host system for actual result text.

## 2017-06-05 ENCOUNTER — Telehealth: Payer: Self-pay

## 2017-06-05 DIAGNOSIS — G4700 Insomnia, unspecified: Secondary | ICD-10-CM

## 2017-06-05 DIAGNOSIS — F411 Generalized anxiety disorder: Secondary | ICD-10-CM

## 2017-06-05 DIAGNOSIS — M722 Plantar fascial fibromatosis: Secondary | ICD-10-CM

## 2017-06-05 DIAGNOSIS — G5600 Carpal tunnel syndrome, unspecified upper limb: Secondary | ICD-10-CM

## 2017-06-05 NOTE — Telephone Encounter (Signed)
Pt calling to request refill of: zolpidem, tramadol and diazepam. Please call pt @ [phone] when ready to pick up. Ottis Stain, CMA   Name of Medication(s):  Zolpidem, tramadol and diazepam Last date of OV:  12/26/2016  Pharmacy:  Pick up  Will route refill request to Clinic RN.  Discussed with patient policy to call pharmacy for future refills.  Also, discussed refills may take up to 48 hours to approve or deny.  Ottis Stain

## 2017-06-06 MED ORDER — DIAZEPAM 10 MG PO TABS: 10.0000 mg | ORAL_TABLET | Freq: Three times a day (TID) | ORAL | 0 refills | Status: DC

## 2017-06-06 MED ORDER — ZOLPIDEM TARTRATE 10 MG PO TABS: 10.0000 mg | ORAL_TABLET | Freq: Every evening | ORAL | 0 refills | Status: DC | PRN

## 2017-06-06 MED ORDER — TRAMADOL HCL 50 MG PO TABS: 50.0000 mg | ORAL_TABLET | Freq: Three times a day (TID) | ORAL | 0 refills | Status: DC | PRN

## 2017-06-06 NOTE — Telephone Encounter (Signed)
Called and talked to patient about a refill request on his Ambien, diazepam and tramadol. I am assuming his care as primary care doctor and has never met patient in the past. From reviewing his chart, he has been on these medications for long time. I gave him one month supply on each medications and encouraged patient to make an office visit so that we can meet and know each other. Patient voices understanding and agrees. He is appreciative about the call. I left the printed Rx's at the front desk for patient to pick up.

## 2017-06-20 ENCOUNTER — Other Ambulatory Visit: Payer: Self-pay | Admitting: Family Medicine

## 2017-06-20 DIAGNOSIS — G4700 Insomnia, unspecified: Secondary | ICD-10-CM

## 2017-07-16 ENCOUNTER — Encounter: Payer: Self-pay | Admitting: Student

## 2017-07-16 ENCOUNTER — Ambulatory Visit (INDEPENDENT_AMBULATORY_CARE_PROVIDER_SITE_OTHER): Payer: BLUE CROSS/BLUE SHIELD | Admitting: Student

## 2017-07-16 VITALS — BP 122/80 | HR 77 | Temp 98.1°F | Ht 70.0 in | Wt 203.0 lb

## 2017-07-16 DIAGNOSIS — M722 Plantar fascial fibromatosis: Secondary | ICD-10-CM

## 2017-07-16 DIAGNOSIS — G5600 Carpal tunnel syndrome, unspecified upper limb: Secondary | ICD-10-CM | POA: Diagnosis not present

## 2017-07-16 DIAGNOSIS — I10 Essential (primary) hypertension: Secondary | ICD-10-CM

## 2017-07-16 DIAGNOSIS — G4700 Insomnia, unspecified: Secondary | ICD-10-CM | POA: Diagnosis not present

## 2017-07-16 DIAGNOSIS — E785 Hyperlipidemia, unspecified: Secondary | ICD-10-CM

## 2017-07-16 DIAGNOSIS — F411 Generalized anxiety disorder: Secondary | ICD-10-CM

## 2017-07-16 DIAGNOSIS — M25579 Pain in unspecified ankle and joints of unspecified foot: Secondary | ICD-10-CM | POA: Diagnosis not present

## 2017-07-16 DIAGNOSIS — N400 Enlarged prostate without lower urinary tract symptoms: Secondary | ICD-10-CM

## 2017-07-16 MED ORDER — DIAZEPAM 10 MG PO TABS: 10.0000 mg | ORAL_TABLET | Freq: Three times a day (TID) | ORAL | 3 refills | Status: DC

## 2017-07-16 MED ORDER — TRAMADOL HCL 50 MG PO TABS: 50.0000 mg | ORAL_TABLET | Freq: Three times a day (TID) | ORAL | 3 refills | Status: DC | PRN

## 2017-07-16 MED ORDER — PRAVASTATIN SODIUM 40 MG PO TABS: 40.0000 mg | ORAL_TABLET | Freq: Every day | ORAL | 3 refills | Status: DC

## 2017-07-16 MED ORDER — ZOLPIDEM TARTRATE 10 MG PO TABS: 10.0000 mg | ORAL_TABLET | Freq: Every evening | ORAL | 3 refills | Status: DC | PRN

## 2017-07-16 NOTE — Assessment & Plan Note (Signed)
On diazepam 10 mg 3 times a day for over 10 years. GAD-7: 0 today. At this point, it is really hard to tell apart if he has anxiety or withdrawal from benzo. I discussed about this with the patient in detail. I also discussed about other options like SSRI. However, patient is resistant to the later saying that the diazepam has work well for him and has no problem with it. He has been to our mood clinic in the past. He says he didn't like the environment. -Refilled his Ambien. [place] data reviewed. No red flags. -diazepam (VALIUM) 10 MG tablet; Take 1 tablet (10 mg total) by mouth 3 (three) times daily.  Dispense: 90 tablet; Refill: 3

## 2017-07-16 NOTE — Progress Notes (Signed)
Subjective:    Jason Williams is a 56 y.o. old male here to meet new PCP and for medication refills.  HPI  Medication refill/Anxiety: Patient with history of anxiety. He has been on diazepam 10 mg 3 times a day for many years (since at least 2010). This was started by Dr. Annamary Carolin. He says his anxiety is well controlled with this regimen. Doesn't seem he tried several medications such as SSRI. He is also resistant to the idea of this. He says he has been to our mood clinic in the past but didn't like the environment.   Insomnia: Chronically on Ambien for years.   PMH/Problem List: has Hyperlipidemia; Unspecified episodic mood disorder; Anxiety state; PAIN IN JOINT, ANKLE AND FOOT; PLANTAR FASCIITIS, BILATERAL; Insomnia; SNORING; Hypertension; Carpal tunnel syndrome; Chronic throat clearing; Postnasal drip; ED; GERD (gastroesophageal reflux disease); and BPH (benign prostatic hyperplasia) on his problem list.   has a past medical history of Anxiety; Depression; GERD (gastroesophageal reflux disease); Hyperlipidemia; and Hypertension.  FH:  Family History  Problem Relation Age of Onset  . Esophageal cancer Mother   . Heart disease Maternal Grandfather        MI/CABG  . Heart attack Maternal Grandfather   . Heart attack Father        Died of MI at 59  . Healthy Sister     [facility] Social History  Substance Use Topics  . Smoking status: Never Smoker  . Smokeless tobacco: Never Used  . Alcohol use 1.2 oz/week    2 Glasses of wine per week    Review of Systems Review of systems negative except for pertinent positives and negatives in history of present illness above.     Objective:     Vitals:   07/16/17 1143  BP: 122/80  Pulse: 77  Temp: 98.1 F (36.7 C)  TempSrc: Oral  SpO2: 98%  Weight: 203 lb (92.1 kg)  Height: 5\' 10"  (1.778 m)    Physical Exam GEN: appears well, no apparent distress. Head: normocephalic and atraumatic  Oropharynx: mmm without erythema or exudation. Poor  dentition with multiple filling CVS: RRR, nl S1&S2, no murmurs, no edema RESP: no IWOB, good air movement bilaterally, CTAB GI: BS present & normal, soft, NTND MSK: no focal tenderness or notable swelling SKIN: no apparent skin lesion NEURO: alert and oiented appropriately, no gross deficits  PSYCH: euthymic mood with congruent affect    GAD 7 : Generalized Anxiety Score 07/16/2017  Nervous, Anxious, on Edge 0  Control/stop worrying 0  Worry too much - different things 0  Trouble relaxing 0  Restless 0  Easily annoyed or irritable 0  Afraid - awful might happen 0  Total GAD 7 Score 0  Anxiety Difficulty Not difficult at all     Assessment and Plan:  1. Essential hypertension: Well controlled on amlodipine 10 mg daily. -Continue amlodipine - Basic metabolic panel  2. Anxiety state: On diazepam 10 mg 3 times a day for over 10 years. GAD-7: 0 today. At this point, it is really hard to tell apart if he has anxiety or withdrawal from benzo. I discussed about this with the patient in detail. I also discussed about other options like SSRI. However, patient is resistant to the later saying that the diazepam has work well for him and has no problem with it. He has been to our mood clinic in the past. He says he didn't like the environment. -Refilled his Ambien. [place] data reviewed. No red flags. -diazepam (VALIUM)  10 MG tablet; Take 1 tablet (10 mg total) by mouth 3 (three) times daily.  Dispense: 90 tablet; Refill: 3  3. Hyperlipidemia, unspecified hyperlipidemia type: ASCVD risk score of 8.4%. No history of diabetes. Intolerance to multiple statins in the past including simvastatin, Lipitor and Crestor. We will try pravastatin and recheck his lipid panel. - Lipid Panel - pravastatin (PRAVACHOL) 40 MG tablet; Take 1 tablet (40 mg total) by mouth daily.  Dispense: 90 tablet; Refill: 3  4. Benign prostatic hyperplasia, unspecified whether lower urinary tract symptoms present: On Flomax. Denies  urinary symptoms. - PSA, total and free  5. Insomnia, unspecified type: On Ambien for over 10 years for this condition. Refilled his Ambien. - zolpidem (AMBIEN) 10 MG tablet; Take 1 tablet (10 mg total) by mouth at bedtime as needed. for sleep  Dispense: 30 tablet; Refill: 3  6. Bilateral ankle pain - traMADol (ULTRAM) 50 MG tablet; Take 1 tablet (50 mg total) by mouth every 8 (eight) hours as needed (pain).  Dispense: 90 tablet; Refill: 3  Follow-up in 1-2 months for his annual physical. Mercy Riding, MD 07/16/17 Pager: [phone]

## 2017-07-16 NOTE — Patient Instructions (Signed)
It was great seeing you today! We have addressed the following issues today 1. Anxiety issue: We have refilled your diazepam today. However, this medication is very addictive and is difficult to tell between anxiety state and withdrawal from this medication.  2.   Cholesterol: I have sent a prescription for pravastatin to your pharmacy. We are checking the lipid panel today.  3.   Sleep: I have refilled your Ambien today.  If we did any lab work today, and the results require attention, either me or my nurse will get in touch with you. If everything is normal, you will get a letter in mail and a message via . If you don't hear from [place] in two weeks, please give [place] a call. Otherwise, we look forward to seeing you again at your next visit. If you have any questions or concerns before then, please call the clinic at [phone].  Please bring all your medications to every doctors visit  Sign up for My Chart to have easy access to your labs results, and communication with your Primary care physician.    Please check-out at the front desk before leaving the clinic.    Take Care,   Dr. Cyndia Skeeters

## 2017-07-16 NOTE — Assessment & Plan Note (Signed)
ASCVD risk score of 8.4%. No history of diabetes. Intolerance to multiple statins in the past including simvastatin, Lipitor and Crestor. We will try pravastatin and recheck his lipid panel. - Lipid Panel - pravastatin (PRAVACHOL) 40 MG tablet; Take 1 tablet (40 mg total) by mouth daily.  Dispense: 90 tablet; Refill: 3

## 2017-07-16 NOTE — Assessment & Plan Note (Signed)
On Flomax. Denies urinary symptoms. - PSA, total and free

## 2017-07-16 NOTE — Assessment & Plan Note (Signed)
Well controlled on amlodipine 10 mg daily. -Continue amlodipine - Basic metabolic panel

## 2017-07-16 NOTE — Assessment & Plan Note (Signed)
TraMADol (ULTRAM) 50 MG tablet; Take 1 tablet (50 mg total) by mouth every 8 (eight) hours as needed (pain).  Dispense: 90 tablet; Refill: 3

## 2017-07-16 NOTE — Assessment & Plan Note (Signed)
On Ambien for over 10 years for this condition. Refilled his Ambien. - zolpidem (AMBIEN) 10 MG tablet; Take 1 tablet (10 mg total) by mouth at bedtime as needed. for sleep  Dispense: 30 tablet; Refill: 3

## 2017-07-17 ENCOUNTER — Encounter: Payer: Self-pay | Admitting: Student

## 2017-07-17 LAB — LIPID PANEL
CHOL/HDL RATIO: 4.2 ratio (ref 0.0–5.0)
Cholesterol, Total: 210 mg/dL — ABNORMAL HIGH (ref 100–199)
HDL: 50 mg/dL (ref >39)
LDL CALC: 131 mg/dL — AB (ref 0–99)
Triglycerides: 145 mg/dL (ref 0–149)
VLDL Cholesterol Cal: 29 mg/dL (ref 5–40)

## 2017-07-17 LAB — BASIC METABOLIC PANEL
BUN / CREAT RATIO: 13 (ref 9–20)
BUN: 14 mg/dL (ref 6–24)
CHLORIDE: 103 mmol/L (ref 96–106)
CO2: 19 mmol/L — ABNORMAL LOW (ref 20–29)
Calcium: 9.2 mg/dL (ref 8.7–10.2)
Creatinine, Ser: 1.07 mg/dL (ref 0.76–1.27)
GFR calc non Af Amer: 77 mL/min/{1.73_m2} (ref >59)
GFR, EST AFRICAN AMERICAN: 89 mL/min/{1.73_m2} (ref >59)
GLUCOSE: 96 mg/dL (ref 65–99)
POTASSIUM: 5.1 mmol/L (ref 3.5–5.2)
SODIUM: 138 mmol/L (ref 134–144)

## 2017-07-17 LAB — PSA, TOTAL AND FREE
PSA FREE PCT: 22 %
PSA FREE: 0.66 ng/mL
Prostate Specific Ag, Serum: 3 ng/mL (ref 0.0–4.0)

## 2017-07-17 NOTE — Progress Notes (Signed)
Lipid panel, BMP and PSA within normal range. Result letter routed to [place] for mail out.

## 2017-10-10 ENCOUNTER — Other Ambulatory Visit: Payer: Self-pay | Admitting: Family Medicine

## 2017-11-12 ENCOUNTER — Other Ambulatory Visit: Payer: Self-pay | Admitting: Student

## 2017-11-12 DIAGNOSIS — F411 Generalized anxiety disorder: Secondary | ICD-10-CM

## 2017-11-12 DIAGNOSIS — G4700 Insomnia, unspecified: Secondary | ICD-10-CM

## 2017-11-14 ENCOUNTER — Encounter: Payer: Self-pay | Admitting: Student

## 2017-11-14 ENCOUNTER — Ambulatory Visit: Payer: BLUE CROSS/BLUE SHIELD | Admitting: Student

## 2017-11-14 ENCOUNTER — Other Ambulatory Visit: Payer: Self-pay

## 2017-11-14 VITALS — BP 122/78 | HR 68 | Temp 98.2°F | Ht 70.0 in | Wt 199.8 lb

## 2017-11-14 DIAGNOSIS — M722 Plantar fascial fibromatosis: Secondary | ICD-10-CM

## 2017-11-14 DIAGNOSIS — G4700 Insomnia, unspecified: Secondary | ICD-10-CM

## 2017-11-14 DIAGNOSIS — F411 Generalized anxiety disorder: Secondary | ICD-10-CM

## 2017-11-14 DIAGNOSIS — Z23 Encounter for immunization: Secondary | ICD-10-CM

## 2017-11-14 DIAGNOSIS — M25579 Pain in unspecified ankle and joints of unspecified foot: Secondary | ICD-10-CM

## 2017-11-14 DIAGNOSIS — Z1211 Encounter for screening for malignant neoplasm of colon: Secondary | ICD-10-CM

## 2017-11-14 MED ORDER — TRAMADOL HCL 50 MG PO TABS: 50.0000 mg | ORAL_TABLET | Freq: Three times a day (TID) | ORAL | 3 refills | Status: DC | PRN

## 2017-11-14 NOTE — Patient Instructions (Signed)
It was great seeing you today! We have addressed the following issues today  Anxiety: We have already refilled your Valium.  Sleep issue: we have already refilled your Ambien  Ankle pain: we gave you a prescription for tramadol.   Bursitis: I recommend icing for about 15 minutes after every exercise.  You can also try ibuprofen or Aleve as needed  Screening for colon cancer: Please call 1 of the numbers below to schedule for your colonoscopy.  If we did any lab work today, and the results require attention, either me or my nurse will get in touch with you. If everything is normal, you will get a letter in mail and a message via . If you don't hear from [place] in two weeks, please give [place] a call. Otherwise, we look forward to seeing you again at your next visit. If you have any questions or concerns before then, please call the clinic at [phone].  Please bring all your medications to every doctors visit  Sign up for My Chart to have easy access to your labs results, and communication with your Primary care physician.    Please check-out at the front desk before leaving the clinic.    Take Care,   Dr. Cyndia Skeeters      Colon Cancer  People with early colon cancer usually have no warning signs or symptoms.  If found early, most patients can be cured, but if found when it has already spread, the chance of survival is not as good.  Colon cancer is the second most common cause of concern is in the [place] with over 56,000 deaths from colon cancer in 2005  Colon cancer is a common, treatable disease. Screening tests can find a cancer  early, before you have symptoms, and make it more likely that you will survive the disease.  Who needs to be tested? If you are age 1-75 yrs, you should be tested for colon cancer.  Ways to be tested:  A colonoscopy the best test to detect colon cancer. It requires you to drink a bowel preparation to clean out your colon before the test. During this test, a  tube with a camera inserted into your rectum and examines your entire colon. You can be given medicine to make you sleepy during the exam. Therefore, you will not be able to drive immediately after the test. There is a small risk of bowel injury during the test.   Stool cards that you can take home and take a sample of your stool is another option. The cards are not as good as colonoscopy at detecting cancer, but the tests are easier and cheaper.   To schedule the colonoscopy, you can call one of the 3 options below:  [facility]. Phone number: [phone]  [facility]. Phone number: [phone]  [facility]: Phone number [phone]

## 2017-11-14 NOTE — Progress Notes (Signed)
Subjective:    Jason Williams is a 56 y.o. old male here for medication refills  HPI  Anxiety: patient return for medication refill on his anxiety. He has been on diazepam 10 mg 3 times a day for many years (since at least 2010). This was started by Dr. Annamary Williams. He says his anxiety is well controlled with this regimen. He continues to resist the idea of trying SSRI.  He says he has been to our mood clinic in the past but didn't like the environment. He denies using more than prescribed. Denies recreational drug use or drinking alcohol  Insomnia: well controlled with Ambien. He has been on this for years.   Ankle pain: reports bilateral ankle reconstruction surgery. No changes in the nature of his medication. Tried orthotics without improvement in the past. He says tramadol makes pain bearable to move.  The 10-year ASCVD risk score Jason Williams., et al., 2013) is: 6.9%   Values used to calculate the score:     Age: 63 years     Sex: Male     Is Non-Hispanic African American: No     Diabetic: No     Tobacco smoker: No     Systolic Blood Pressure: 299 mmHg     Is BP treated: Yes     HDL Cholesterol: 50 mg/dL     Total Cholesterol: 210 mg/dL  PMH/Problem List: has Hyperlipidemia; Unspecified episodic mood disorder; Anxiety state; PAIN IN JOINT, ANKLE AND FOOT; PLANTAR FASCIITIS, BILATERAL; Insomnia; SNORING; Hypertension; Carpal tunnel syndrome; Chronic throat clearing; Postnasal drip; ED; GERD (gastroesophageal reflux disease); and BPH (benign prostatic hyperplasia) on their problem list.   has a past medical history of Anxiety, Depression, GERD (gastroesophageal reflux disease), Hyperlipidemia, and Hypertension.  FH:  Family History  Problem Relation Age of Onset  . Esophageal cancer Mother   . Heart disease Maternal Grandfather        MI/CABG  . Heart attack Maternal Grandfather   . Heart attack Father        Died of MI at 62  . Healthy Sister     [facility] Social History   Tobacco Use    . Smoking status: Never Smoker  . Smokeless tobacco: Never Used  Substance Use Topics  . Alcohol use: Yes    Alcohol/week: 1.2 oz    Types: 2 Glasses of wine per week  . Drug use: No    Review of Systems Review of systems negative except for pertinent positives and negatives in history of present illness above.     Objective:     Vitals:   11/14/17 0917  BP: 122/78  Pulse: 68  Temp: 98.2 F (36.8 C)  TempSrc: Oral  SpO2: 96%  Weight: 199 lb 12.8 oz (90.6 kg)  Height: 5\' 10"  (1.778 m)   Body mass index is 28.67 kg/m.  Physical Exam  GEN: appears well, no apparent distress. CVS: RRR, nl s1 & s2, no murmurs, no edema RESP: no IWOB, good air movement bilaterally, CTAB GI: BS present & normal, soft, NTND MSK: no focal tenderness or notable swelling SKIN: no apparent skin lesion NEURO: alert and oiented appropriately, no gross deficits  PSYCH: euthymic mood with congruent affect    Assessment and Plan:  1. GAD (generalized anxiety disorder) Again, it is really hard to tell if he has anxiety or withdrawal from benzo. He seems to functioning well with valium. He has been on valium for years. He continues to resist SSRI. No red flag noted.  He has been to our mood clinic in the past. He says he didn't like the environment. Refilled his valium yesterday  2. Insomnia, unspecified type: chronically on Ambien. This worked well for him. No issues or adverse effects. Already refilled his Ambien  3. Pain in joint involving ankle and foot, unspecified laterality -Refilled his tramadol  4. Need for immunization against influenza - Flu Vaccine QUAD 36+ mos IM  5. Special screening for malignant neoplasms, colon: likes to have his coloscopy done in new year. Gave him phone numbers to call and schedule   Return in about 1 year (around 11/14/2018) for Annual physical.  Mercy Riding, MD 11/14/17 Pager: [phone]

## 2018-01-03 ENCOUNTER — Telehealth: Payer: Self-pay | Admitting: Student

## 2018-01-03 DIAGNOSIS — N529 Male erectile dysfunction, unspecified: Secondary | ICD-10-CM

## 2018-01-03 MED ORDER — SILDENAFIL CITRATE 20 MG PO TABS: ORAL_TABLET | ORAL | 0 refills | Status: DC

## 2018-01-03 NOTE — Telephone Encounter (Signed)
Pt would like the generic viagra called into [facility] on [address]. He had the Rx previously but it was too expensive

## 2018-01-08 ENCOUNTER — Other Ambulatory Visit: Payer: Self-pay | Admitting: Student

## 2018-01-08 DIAGNOSIS — F411 Generalized anxiety disorder: Secondary | ICD-10-CM

## 2018-03-26 ENCOUNTER — Telehealth: Payer: Self-pay | Admitting: Student

## 2018-03-26 NOTE — Telephone Encounter (Signed)
Spoke to pt he would like a Rheumatology referral to [facility]. He remembers a [employer] officer did the injection and thinks it was [facility]. Please let pt know when referral has been placed.  Ottis Stain, CMA

## 2018-03-26 NOTE — Telephone Encounter (Signed)
I don't inject cortisone in knuckles. [facility] or wherever he got this in the past. Please let him know. Thank you, Bretta Bang

## 2018-03-26 NOTE — Telephone Encounter (Signed)
Pt wants to know if he will actually be Cortizone shots when he comes to his appt on Friday or if it's just a consult. Pt said last time he came for this he ended up getting sent elsewhere to get the shots and he doesn't want to waste time coming here if he's not getting shots on Friday. He would like someone to call him to discuss this before he comes in on Friday. Please advise

## 2018-03-27 ENCOUNTER — Other Ambulatory Visit: Payer: Self-pay | Admitting: Student

## 2018-03-27 DIAGNOSIS — M25441 Effusion, right hand: Secondary | ICD-10-CM

## 2018-03-27 NOTE — Telephone Encounter (Signed)
Talked to patient, and ordered ambulatory referral to rheumatologist at [facility]. He says he had cortisone injection to his knuckles in the past at the same place and likes to have the same treatment again.

## 2018-03-28 ENCOUNTER — Ambulatory Visit: Payer: Self-pay | Admitting: Student

## 2018-03-29 ENCOUNTER — Ambulatory Visit: Payer: BLUE CROSS/BLUE SHIELD | Admitting: Student

## 2018-04-12 ENCOUNTER — Telehealth: Payer: Self-pay | Admitting: Student

## 2018-04-12 DIAGNOSIS — M722 Plantar fascial fibromatosis: Secondary | ICD-10-CM

## 2018-04-12 DIAGNOSIS — G4700 Insomnia, unspecified: Secondary | ICD-10-CM

## 2018-04-15 NOTE — Telephone Encounter (Signed)
Pt called because his pharmacy told him that his refill for Tramadol was denied. The pharmacy told him it was denied because he would have to have an office visit before it could be refilled and pt said he was told at his last visit he wouldn't need to come back for another year in December of 2019 because his meds would be filled until then. I told pt it looked liked it was filled on Friday the 17th but his pharmacy is saying otherwise. He would like a nurse to call and discuss this with him.

## 2018-04-16 DIAGNOSIS — M542 Cervicalgia: Secondary | ICD-10-CM | POA: Diagnosis not present

## 2018-04-16 DIAGNOSIS — M1990 Unspecified osteoarthritis, unspecified site: Secondary | ICD-10-CM | POA: Diagnosis not present

## 2018-04-16 DIAGNOSIS — M79642 Pain in left hand: Secondary | ICD-10-CM | POA: Diagnosis not present

## 2018-04-16 DIAGNOSIS — M79641 Pain in right hand: Secondary | ICD-10-CM | POA: Diagnosis not present

## 2018-04-16 DIAGNOSIS — M064 Inflammatory polyarthropathy: Secondary | ICD-10-CM | POA: Diagnosis not present

## 2018-04-16 DIAGNOSIS — M2550 Pain in unspecified joint: Secondary | ICD-10-CM | POA: Diagnosis not present

## 2018-04-16 MED ORDER — TRAMADOL HCL 50 MG PO TABS: 50.0000 mg | ORAL_TABLET | Freq: Three times a day (TID) | ORAL | 1 refills | Status: DC | PRN

## 2018-04-16 NOTE — Addendum Note (Signed)
Addended by: Wendee Beavers T on: 04/16/2018 08:10 AM   Modules accepted: Orders

## 2018-04-16 NOTE — Telephone Encounter (Signed)
Left detailed message letting pt know Rx has been sent. Ottis Stain, CMA d

## 2018-04-16 NOTE — Telephone Encounter (Signed)
Sorry it printed when I tried to fill. I just sent his prescription to his pharmacy. Thanks, Bretta Bang

## 2018-04-16 NOTE — Addendum Note (Signed)
Addended by: Wendee Beavers T on: 04/16/2018 09:34 AM   Modules accepted: Orders

## 2018-04-26 ENCOUNTER — Ambulatory Visit: Payer: BLUE CROSS/BLUE SHIELD | Admitting: Student

## 2018-05-08 ENCOUNTER — Other Ambulatory Visit: Payer: Self-pay | Admitting: Student

## 2018-05-08 DIAGNOSIS — G4700 Insomnia, unspecified: Secondary | ICD-10-CM

## 2018-05-09 NOTE — Telephone Encounter (Signed)
Pt calling for refill of ambien. Please call and let him know when this has been sent. Ottis Stain, CMA

## 2018-05-31 DIAGNOSIS — M1990 Unspecified osteoarthritis, unspecified site: Secondary | ICD-10-CM | POA: Diagnosis not present

## 2018-05-31 DIAGNOSIS — M79646 Pain in unspecified finger(s): Secondary | ICD-10-CM | POA: Diagnosis not present

## 2018-05-31 DIAGNOSIS — M064 Inflammatory polyarthropathy: Secondary | ICD-10-CM | POA: Diagnosis not present

## 2018-05-31 DIAGNOSIS — M79641 Pain in right hand: Secondary | ICD-10-CM | POA: Diagnosis not present

## 2018-05-31 DIAGNOSIS — M542 Cervicalgia: Secondary | ICD-10-CM | POA: Diagnosis not present

## 2018-05-31 DIAGNOSIS — M79644 Pain in right finger(s): Secondary | ICD-10-CM | POA: Diagnosis not present

## 2018-05-31 DIAGNOSIS — M2550 Pain in unspecified joint: Secondary | ICD-10-CM | POA: Diagnosis not present

## 2018-06-10 ENCOUNTER — Other Ambulatory Visit: Payer: Self-pay

## 2018-06-10 DIAGNOSIS — M722 Plantar fascial fibromatosis: Secondary | ICD-10-CM

## 2018-06-12 ENCOUNTER — Telehealth: Payer: Self-pay | Admitting: Family Medicine

## 2018-06-12 ENCOUNTER — Telehealth: Payer: Self-pay

## 2018-06-12 DIAGNOSIS — M722 Plantar fascial fibromatosis: Secondary | ICD-10-CM

## 2018-06-12 MED ORDER — TRAMADOL HCL 50 MG PO TABS: 50.0000 mg | ORAL_TABLET | Freq: Three times a day (TID) | ORAL | 1 refills | Status: DC | PRN

## 2018-06-12 NOTE — Telephone Encounter (Signed)
Pt called nurse line stating his pharmacy told him his Tramadol was refused by pcp, however is being replaced with something else. As of now, pharmacy has not received any replacement medication. Please advise.

## 2018-06-12 NOTE — Telephone Encounter (Signed)
Called patient to clarify med request.  He would like to stay on tramadol.  refilling  -Dr. Criss Rosales

## 2018-07-01 ENCOUNTER — Other Ambulatory Visit: Payer: Self-pay

## 2018-07-02 MED ORDER — AMLODIPINE BESYLATE 10 MG PO TABS: 10.0000 mg | ORAL_TABLET | Freq: Every day | ORAL | 2 refills | Status: DC

## 2018-07-11 ENCOUNTER — Other Ambulatory Visit: Payer: Self-pay | Admitting: *Deleted

## 2018-07-11 DIAGNOSIS — F411 Generalized anxiety disorder: Secondary | ICD-10-CM

## 2018-07-12 MED ORDER — DIAZEPAM 10 MG PO TABS: 10.0000 mg | ORAL_TABLET | Freq: Three times a day (TID) | ORAL | 0 refills | Status: DC

## 2018-07-15 ENCOUNTER — Telehealth: Payer: Self-pay | Admitting: *Deleted

## 2018-07-15 NOTE — Telephone Encounter (Signed)
Contacted pt and gave him the below information and appointment scheduled for Monday 07/22/18.

## 2018-07-15 NOTE — Telephone Encounter (Signed)
-----   Message from Sherene Sires, DO sent at 07/12/2018 12:18 PM EDT ----- Regarding: schedule appt Patient needs to schedule appt to discuss medications.  There are some potential conflicts that we'll need to develop a plan for.  His diazepam was refilled for 1 month.

## 2018-07-22 ENCOUNTER — Other Ambulatory Visit: Payer: Self-pay

## 2018-07-22 ENCOUNTER — Encounter: Payer: Self-pay | Admitting: Family Medicine

## 2018-07-22 ENCOUNTER — Ambulatory Visit (INDEPENDENT_AMBULATORY_CARE_PROVIDER_SITE_OTHER): Payer: BLUE CROSS/BLUE SHIELD | Admitting: Family Medicine

## 2018-07-22 VITALS — BP 118/72 | HR 74 | Temp 97.7°F | Ht 70.0 in | Wt 202.4 lb

## 2018-07-22 DIAGNOSIS — G4700 Insomnia, unspecified: Secondary | ICD-10-CM | POA: Diagnosis not present

## 2018-07-22 DIAGNOSIS — F411 Generalized anxiety disorder: Secondary | ICD-10-CM

## 2018-07-22 DIAGNOSIS — M722 Plantar fascial fibromatosis: Secondary | ICD-10-CM | POA: Diagnosis not present

## 2018-07-22 MED ORDER — TRAMADOL HCL 50 MG PO TABS: 50.0000 mg | ORAL_TABLET | Freq: Three times a day (TID) | ORAL | 1 refills | Status: DC | PRN

## 2018-07-23 NOTE — Assessment & Plan Note (Signed)
Will continue tramadol 50  We discussed his records [place]mp[place]. Patient consents that he will not be increased on his medication doses here and that we monitor for additional prescribers.  He was counseled on the dangers of mixing alcohol and other substances with these medications as well as the risk involved with taking them innappropriately.  He understands and agrees with plan.

## 2018-07-23 NOTE — Patient Instructions (Signed)
Patient decline AVS. 

## 2018-07-23 NOTE — Assessment & Plan Note (Signed)
Will continue diazepam 10  We discussed his records in [place]. Patient consents that he will not be increased on his medication doses here and that we monitor for additional prescribers.  He was counseled on the dangers of mixing alcohol and other substances with these medications as well as the risk involved with taking them innappropriately.  He understands and agrees with plan.

## 2018-07-23 NOTE — Assessment & Plan Note (Signed)
Will continue ambien.  We discussed his records in [place]. Patient consents that he will not be increased on his medication doses here and that we monitor for additional prescribers.  He was counseled on the dangers of mixing alcohol and other substances with these medications as well as the risk involved with taking them innappropriately.  He understands and agrees with plan.

## 2018-07-23 NOTE — Progress Notes (Signed)
    Subjective:  Jason Williams is a 57 y.o. male who presents to the [facility] today with a chief complaint of medication management discussion with new pcp.   HPI: Patient asked to come in to review medications.  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  He does not like having to rediscuss matters he considers personal but is willing to do so if it is required for treatment.  We discussed his insomnia/anxiety as one topic as they seem to impact each other.  He works an "on call" high level position and never seems to relax.  He is always on and under pressure.  This leads to problems with him being able to calm down at night and sleep.  He says he has tried relaxation techniques, sleep hygiene, and a number of meds with Lorrin Mais being the only one that helped him sleep.  He said the valium is the only thing that he has found to take the edge off the stress/anxiety in his work.  He denies any illicit drug use, denies drinking more than a drink at a time, denies getting meds from other prescribers.  Tramadol is for chronic foot/ankle pain that he attributes to either athletics when younger or to his long hours standing on his feet now.  He has imaging confirming damage from prior workups.  Objective:  Physical Exam: BP 118/72   Pulse 74   Temp 97.7 F (36.5 C) (Oral)   Ht 5\' 10"  (1.778 m)   Wt 202 lb 6.4 oz (91.8 kg)   SpO2 97%   BMI 29.04 kg/m   Gen: NAD, resting comfortably CV: RRR with no murmurs appreciated Pulm: NWOB, CTAB with no crackles, wheezes, or rhonchi GI: Soft, Nontender, Nondistended. MSK: no edema, cyanosis, or clubbing noted Skin: warm, dry Neuro: grossly normal, moves all extremities Psych: Normal affect and thought content  No results found for this or any previous visit (from the past 72 hour(s)).   Assessment/Plan:  Insomnia Will continue ambien.  We discussed his records in [place]. Patient consents that he will not be increased  on his medication doses here and that we monitor for additional prescribers.  He was counseled on the dangers of mixing alcohol and other substances with these medications as well as the risk involved with taking them innappropriately.  He understands and agrees with plan.  Anxiety state Will continue diazepam 10  We discussed his records in pmpaware. Patient consents that he will not be increased on his medication doses here and that we monitor for additional prescribers.  He was counseled on the dangers of mixing alcohol and other substances with these medications as well as the risk involved with taking them innappropriately.  He understands and agrees with plan.  PLANTAR FASCIITIS, BILATERAL Will continue tramadol 50  We discussed his records in pmpaware. Patient consents that he will not be increased on his medication doses here and that we monitor for additional prescribers.  He was counseled on the dangers of mixing alcohol and other substances with these medications as well as the risk involved with taking them innappropriately.  He understands and agrees with plan.   Sherene Sires, DO FAMILY MEDICINE RESIDENT - PGY2 07/23/2018 10:12 PM

## 2018-08-08 ENCOUNTER — Other Ambulatory Visit: Payer: Self-pay

## 2018-08-08 DIAGNOSIS — F411 Generalized anxiety disorder: Secondary | ICD-10-CM

## 2018-08-08 MED ORDER — DIAZEPAM 10 MG PO TABS: 10.0000 mg | ORAL_TABLET | Freq: Three times a day (TID) | ORAL | 0 refills | Status: DC

## 2018-08-27 ENCOUNTER — Encounter: Payer: Self-pay | Admitting: Internal Medicine

## 2018-09-20 ENCOUNTER — Other Ambulatory Visit: Payer: Self-pay | Admitting: Family Medicine

## 2018-09-20 DIAGNOSIS — F411 Generalized anxiety disorder: Secondary | ICD-10-CM

## 2018-10-04 ENCOUNTER — Telehealth: Payer: Self-pay

## 2018-10-04 NOTE — Telephone Encounter (Signed)
Patient No Showed for PV. A message was left on voicemail to call and reschedule before 5 Pm today. If patient does not call a no show letter will be mailed and procedure scheduled for 10/16/18 will be cancelled per [place] guidelines.   Riki Sheer, LPN (PV )

## 2018-10-16 ENCOUNTER — Encounter: Payer: Self-pay | Admitting: Internal Medicine

## 2018-10-21 ENCOUNTER — Other Ambulatory Visit: Payer: Self-pay | Admitting: Family Medicine

## 2018-10-21 DIAGNOSIS — M722 Plantar fascial fibromatosis: Secondary | ICD-10-CM

## 2018-10-21 DIAGNOSIS — F411 Generalized anxiety disorder: Secondary | ICD-10-CM

## 2018-10-22 MED ORDER — DIAZEPAM 10 MG PO TABS: 10.0000 mg | ORAL_TABLET | Freq: Three times a day (TID) | ORAL | 0 refills | Status: DC

## 2018-11-07 ENCOUNTER — Other Ambulatory Visit: Payer: Self-pay

## 2018-11-07 DIAGNOSIS — G4700 Insomnia, unspecified: Secondary | ICD-10-CM

## 2018-11-08 ENCOUNTER — Encounter: Payer: Self-pay | Admitting: Family Medicine

## 2018-11-08 MED ORDER — ZOLPIDEM TARTRATE 10 MG PO TABS: ORAL_TABLET | ORAL | 1 refills | Status: DC

## 2018-11-08 NOTE — Progress Notes (Signed)
[place] checked and patient without any inidicators of multiple prescribers.  Will continue chronic meds.  -Dr. Criss Rosales

## 2018-11-22 ENCOUNTER — Encounter: Payer: Self-pay | Admitting: Internal Medicine

## 2018-11-28 ENCOUNTER — Other Ambulatory Visit: Payer: Self-pay

## 2018-11-28 DIAGNOSIS — F411 Generalized anxiety disorder: Secondary | ICD-10-CM

## 2018-11-28 MED ORDER — DIAZEPAM 10 MG PO TABS: 10.0000 mg | ORAL_TABLET | Freq: Three times a day (TID) | ORAL | 0 refills | Status: DC

## 2018-12-20 DIAGNOSIS — Z125 Encounter for screening for malignant neoplasm of prostate: Secondary | ICD-10-CM | POA: Diagnosis not present

## 2018-12-20 DIAGNOSIS — N5201 Erectile dysfunction due to arterial insufficiency: Secondary | ICD-10-CM | POA: Diagnosis not present

## 2018-12-27 ENCOUNTER — Other Ambulatory Visit: Payer: Self-pay | Admitting: Family Medicine

## 2018-12-27 DIAGNOSIS — M722 Plantar fascial fibromatosis: Secondary | ICD-10-CM

## 2018-12-27 DIAGNOSIS — F411 Generalized anxiety disorder: Secondary | ICD-10-CM

## 2019-01-21 ENCOUNTER — Telehealth: Payer: Self-pay

## 2019-01-21 NOTE — Telephone Encounter (Signed)
Patient called for referral for screening colonoscopy. Stated he is overdue for this.  Call back is [phone].  Danley Danker, RN [facility] RN)

## 2019-01-22 ENCOUNTER — Encounter: Payer: Self-pay | Admitting: Internal Medicine

## 2019-01-22 ENCOUNTER — Other Ambulatory Visit: Payer: Self-pay | Admitting: Family Medicine

## 2019-01-22 DIAGNOSIS — Z1211 Encounter for screening for malignant neoplasm of colon: Secondary | ICD-10-CM

## 2019-01-22 NOTE — Telephone Encounter (Signed)
Order placed for screening colonoscopy.  A tubular adenoma was found on colonoscopy in 2013, so he should get a repeat every 5-10 years.  I could not access the specific instructions from his GI doctor however.

## 2019-01-22 NOTE — Telephone Encounter (Signed)
Pt informed. Gave pt phone number to [facility] on [address], [place]

## 2019-02-03 ENCOUNTER — Other Ambulatory Visit: Payer: Self-pay | Admitting: Family Medicine

## 2019-02-03 DIAGNOSIS — F411 Generalized anxiety disorder: Secondary | ICD-10-CM

## 2019-02-04 ENCOUNTER — Telehealth: Payer: Self-pay

## 2019-02-04 NOTE — Telephone Encounter (Signed)
Pt had a previsit at 4 no show today. Left a voicemail. No return call back will cancel previsit and procedure and send letter.

## 2019-02-13 ENCOUNTER — Encounter: Payer: Self-pay | Admitting: Internal Medicine

## 2019-02-25 ENCOUNTER — Other Ambulatory Visit: Payer: Self-pay | Admitting: Family Medicine

## 2019-02-25 DIAGNOSIS — M722 Plantar fascial fibromatosis: Secondary | ICD-10-CM

## 2019-02-25 DIAGNOSIS — F411 Generalized anxiety disorder: Secondary | ICD-10-CM

## 2019-03-22 ENCOUNTER — Other Ambulatory Visit: Payer: Self-pay | Admitting: Family Medicine

## 2019-04-06 ENCOUNTER — Other Ambulatory Visit: Payer: Self-pay | Admitting: Family Medicine

## 2019-04-06 DIAGNOSIS — F411 Generalized anxiety disorder: Secondary | ICD-10-CM

## 2019-04-07 NOTE — Telephone Encounter (Signed)
Checked PMP, no suspiscious prescribing/seeking from other sources.  Refill chronic meds -Dr. Criss Rosales

## 2019-05-07 ENCOUNTER — Other Ambulatory Visit: Payer: Self-pay | Admitting: Family Medicine

## 2019-05-07 DIAGNOSIS — M722 Plantar fascial fibromatosis: Secondary | ICD-10-CM

## 2019-05-07 DIAGNOSIS — F411 Generalized anxiety disorder: Secondary | ICD-10-CM

## 2019-05-14 ENCOUNTER — Other Ambulatory Visit: Payer: Self-pay | Admitting: Family Medicine

## 2019-05-14 DIAGNOSIS — G4700 Insomnia, unspecified: Secondary | ICD-10-CM

## 2019-05-14 NOTE — Telephone Encounter (Signed)
PDMP reviewed.

## 2019-06-03 ENCOUNTER — Other Ambulatory Visit: Payer: Self-pay | Admitting: Family Medicine

## 2019-06-03 DIAGNOSIS — M722 Plantar fascial fibromatosis: Secondary | ICD-10-CM

## 2019-06-03 DIAGNOSIS — F411 Generalized anxiety disorder: Secondary | ICD-10-CM

## 2019-06-03 MED ORDER — TRAMADOL HCL 50 MG PO TABS: 50.0000 mg | ORAL_TABLET | Freq: Three times a day (TID) | ORAL | 0 refills | Status: DC | PRN

## 2019-06-30 ENCOUNTER — Other Ambulatory Visit: Payer: Self-pay | Admitting: Family Medicine

## 2019-06-30 DIAGNOSIS — M722 Plantar fascial fibromatosis: Secondary | ICD-10-CM

## 2019-06-30 DIAGNOSIS — F411 Generalized anxiety disorder: Secondary | ICD-10-CM

## 2019-07-30 ENCOUNTER — Other Ambulatory Visit: Payer: Self-pay

## 2019-07-30 ENCOUNTER — Other Ambulatory Visit: Payer: Self-pay | Admitting: Family Medicine

## 2019-07-30 DIAGNOSIS — M722 Plantar fascial fibromatosis: Secondary | ICD-10-CM

## 2019-07-30 DIAGNOSIS — G4700 Insomnia, unspecified: Secondary | ICD-10-CM

## 2019-07-30 DIAGNOSIS — F411 Generalized anxiety disorder: Secondary | ICD-10-CM

## 2019-07-30 MED ORDER — ZOLPIDEM TARTRATE 10 MG PO TABS: ORAL_TABLET | ORAL | 0 refills | Status: DC

## 2019-07-30 MED ORDER — DIAZEPAM 10 MG PO TABS: 10.0000 mg | ORAL_TABLET | Freq: Three times a day (TID) | ORAL | 0 refills | Status: DC

## 2019-07-30 MED ORDER — TRAMADOL HCL 50 MG PO TABS: 50.0000 mg | ORAL_TABLET | Freq: Three times a day (TID) | ORAL | 0 refills | Status: DC | PRN

## 2019-08-26 ENCOUNTER — Encounter: Payer: Self-pay | Admitting: Internal Medicine

## 2019-09-01 ENCOUNTER — Other Ambulatory Visit: Payer: Self-pay | Admitting: Family Medicine

## 2019-09-01 DIAGNOSIS — F411 Generalized anxiety disorder: Secondary | ICD-10-CM

## 2019-09-02 NOTE — Telephone Encounter (Signed)
PDMP was reviewed and appropriate.  Have not seen this patient since August last year, staff will call and schedule an appointment in order for me to continue filling controlled medications.  There is no pattern of concerning behavior on the PDMP,  Dr. Criss Rosales

## 2019-09-03 NOTE — Telephone Encounter (Signed)
Contacted pt and informed him of below.  Due to PCP not having any openings that worked with pt he is scheduled with Dr. Andria Frames for a physical and med review on 09/15/2019 @9 :10am.  Will route to Dr. Criss Rosales as an Juluis Rainier. Reghan Thul Zimmerman Rumple, CMA

## 2019-09-15 ENCOUNTER — Ambulatory Visit (INDEPENDENT_AMBULATORY_CARE_PROVIDER_SITE_OTHER): Payer: BC Managed Care – PPO | Admitting: Family Medicine

## 2019-09-15 ENCOUNTER — Other Ambulatory Visit: Payer: Self-pay

## 2019-09-15 ENCOUNTER — Encounter: Payer: Self-pay | Admitting: Family Medicine

## 2019-09-15 VITALS — BP 110/68 | HR 71 | Wt 203.6 lb

## 2019-09-15 DIAGNOSIS — E785 Hyperlipidemia, unspecified: Secondary | ICD-10-CM | POA: Diagnosis not present

## 2019-09-15 DIAGNOSIS — F411 Generalized anxiety disorder: Secondary | ICD-10-CM

## 2019-09-15 DIAGNOSIS — Z23 Encounter for immunization: Secondary | ICD-10-CM | POA: Diagnosis not present

## 2019-09-15 DIAGNOSIS — Z0000 Encounter for general adult medical examination without abnormal findings: Secondary | ICD-10-CM | POA: Insufficient documentation

## 2019-09-15 DIAGNOSIS — Z114 Encounter for screening for human immunodeficiency virus [HIV]: Secondary | ICD-10-CM

## 2019-09-15 DIAGNOSIS — Z1159 Encounter for screening for other viral diseases: Secondary | ICD-10-CM | POA: Diagnosis not present

## 2019-09-15 DIAGNOSIS — N400 Enlarged prostate without lower urinary tract symptoms: Secondary | ICD-10-CM

## 2019-09-15 DIAGNOSIS — G4700 Insomnia, unspecified: Secondary | ICD-10-CM

## 2019-09-15 DIAGNOSIS — M25571 Pain in right ankle and joints of right foot: Secondary | ICD-10-CM

## 2019-09-15 DIAGNOSIS — M722 Plantar fascial fibromatosis: Secondary | ICD-10-CM

## 2019-09-15 DIAGNOSIS — I10 Essential (primary) hypertension: Secondary | ICD-10-CM

## 2019-09-15 MED ORDER — ZOLPIDEM TARTRATE 10 MG PO TABS: ORAL_TABLET | ORAL | 1 refills | Status: DC

## 2019-09-15 MED ORDER — TRAMADOL HCL 50 MG PO TABS: 50.0000 mg | ORAL_TABLET | Freq: Three times a day (TID) | ORAL | 1 refills | Status: DC | PRN

## 2019-09-15 MED ORDER — DIAZEPAM 10 MG PO TABS: 10.0000 mg | ORAL_TABLET | Freq: Three times a day (TID) | ORAL | 1 refills | Status: DC

## 2019-09-15 NOTE — Assessment & Plan Note (Addendum)
Behind on multiple HPDP Has colonoscopy scheduled Only concern is three controled substances. Job requires immunity check for hep B.  He has received the vaccine.  I initially order the wrong antibody test.  He needs surface antibody, not core antibody.

## 2019-09-15 NOTE — Patient Instructions (Signed)
I hope I got everything done. I will call with blood test results. I refilled your controled substances, three month prescriptions, one refill Please get your colonoscopy done. Someone should call with the ortho referral. See [place] in 6 months.

## 2019-09-15 NOTE — Assessment & Plan Note (Signed)
ambien controls sx.  Does not ask for escalation.   Refill x 6 months.  We discussed moderate concern for three chronic controled substances.

## 2019-09-15 NOTE — Assessment & Plan Note (Addendum)
Lipids then calculate ASCVD risk.  Perhaps retry statin.  Current ASCVD risk =3.9% - but lifetime ASCVD risk is 39% Will discuss statin therapy option.

## 2019-09-15 NOTE — Assessment & Plan Note (Signed)
Stable sx.  Requests PSA screen.  Has had friends who have suffered with early metastatic prostate Ca.

## 2019-09-15 NOTE — Progress Notes (Signed)
Established Patient Office Visit  Subjective:  Patient ID: Jason Williams, male    DOB: 07-07-1961  Age: 58 y.o. MRN: [id number]  CC:  Chief Complaint  Patient presents with  . Annual Exam    HPI LAKAI MOREE presents for annual exam and much more. 1. Annual exam.  Does not smoke.  Drinks socially without any concern.  Active, but limited due to chronic foot and ankle problems. HPDP  Never HIV screen, low risk  Never Hep c screen, low risk  Had Hep B vaccination.  Work requires antibody test to prove immunity.  Has appoointment for colonoscopy.    Needs flu and tetanus.  Wants PSA screen.  Aware of controversy. 2. High cholesterol.  Due for labs.  Attributed some of his musculoskeletal pains in the past to statins.  Not on currently.  Willing to retry.  On ASA for primary CAD prevention. 3. On three chronic controled substances.  "They have tried lots of things over the past 10 years and this is what works.  Tramadol for chronic foot and ankle pain.  vEry active in his work.  Thinks he now has a partially torn right achilles tendon.  Wants ortho referal.  Diazepam for chronic anxiety  Ambien - "otherwise I don't sleep."     Past Medical History:  Diagnosis Date  . Anxiety   . Depression   . GERD (gastroesophageal reflux disease)   . Hyperlipidemia   . Hypertension     Past Surgical History:  Procedure Laterality Date  . ankle re construction  2011   bilateral  . APPENDECTOMY  1981    Family History  Problem Relation Age of Onset  . Esophageal cancer Mother   . Heart disease Maternal Grandfather        MI/CABG  . Heart attack Maternal Grandfather   . Heart attack Father        Died of MI at 32  . Healthy Sister     Social History   Socioeconomic History  . Marital status: Single    Spouse name: Not on file  . Number of children: 1  . Years of education: Not on file  . Highest education level: Not on file  Occupational History  .  Occupation: Lobbyist: [employer]  . Financial resource strain: Not on file  . Food insecurity    Worry: Not on file    Inability: Not on file  . Transportation needs    Medical: Not on file    Non-medical: Not on file  Tobacco Use  . Smoking status: Never Smoker  . Smokeless tobacco: Never Used  Substance and Sexual Activity  . Alcohol use: Yes    Alcohol/week: 2.0 standard drinks    Types: 2 Glasses of wine per week  . Drug use: No  . Sexual activity: Yes    Birth control/protection: Pill, None    Comment: 1 male  Lifestyle  . Physical activity    Days per week: Not on file    Minutes per session: Not on file  . Stress: Not on file  Relationships  . Social Herbalist on phone: Not on file    Gets together: Not on file    Attends religious service: Not on file    Active member of club or organization: Not on file    Attends meetings of clubs or organizations: Not on file    Relationship status:  Not on file  . Intimate partner violence    Fear of current or ex partner: Not on file    Emotionally abused: Not on file    Physically abused: Not on file    Forced sexual activity: Not on file  Other Topics Concern  . Not on file  Social History Narrative  . Not on file    Outpatient Medications Prior to Visit  Medication Sig Dispense Refill  . amLODipine (NORVASC) 10 MG tablet TAKE 1 TABLET BY MOUTH EVERY DAY 90 tablet 2  . aspirin ([facility] ASPIRIN CHILD) 81 MG chewable tablet TAKE 1 TABLET BY MOUTH DAILY TO THIN BLOOD TO HELP TO PREVENT HEART ATTACK AND STROKE    . sildenafil (REVATIO) 20 MG tablet Take 20 to 100 mg by mouth about 30 minutes prior to sexual activity 20 tablet 0  . diazepam (VALIUM) 10 MG tablet TAKE 1 TABLET BY MOUTH THREE TIMES A DAY 90 tablet 0  . traMADol (ULTRAM) 50 MG tablet Take 1 tablet (50 mg total) by mouth every 8 (eight) hours as needed. 90 tablet 0  . zolpidem (AMBIEN) 10 MG tablet TAKE 1 TABLET BY MOUTH  AT BEDTIME AS NEEDED FOR SLEEP 90 tablet 0   No facility-administered medications prior to visit.     No Known Allergies  ROS Review of Systems   Frequent headaches.  Takes advil 2 or 3 times per week.  No change in pattern.  Denies cP, SOB, bleeding, weakness or numbness.  Denies change in bowel, bladder, weight appetite or urine. Objective:    Physical Exam  BP 110/68   Pulse 71   Wt 203 lb 9.6 oz (92.4 kg)   SpO2 98%   BMI 29.21 kg/m  Wt Readings from Last 3 Encounters:  09/15/19 203 lb 9.6 oz (92.4 kg)  07/22/18 202 lb 6.4 oz (91.8 kg)  11/14/17 199 lb 12.8 oz (90.6 kg)   HEENT WNL Neck supple without mass Lungs clear Cardiac rRR without m or g Abd benign Ext no edema Neuro, motor, sensory and cognition grossly intact.  Health Maintenance Due  Topic Date Due  . Hepatitis C Screening  11/24/1961  . HIV Screening  05/27/1976  . COLONOSCOPY  01/17/2017  . INFLUENZA VACCINE  06/28/2019  . TETANUS/TDAP  08/17/2019    There are no preventive care reminders to display for this patient.  Lab Results  Component Value Date   TSH 2.58 03/31/2016   Lab Results  Component Value Date   WBC 5.4 03/31/2016   HGB 15.1 03/31/2016   HCT 43.9 03/31/2016   MCV 91.5 03/31/2016   PLT 242 03/31/2016   Lab Results  Component Value Date   NA 138 07/16/2017   K 5.1 07/16/2017   CO2 19 (L) 07/16/2017   GLUCOSE 96 07/16/2017   BUN 14 07/16/2017   CREATININE 1.07 07/16/2017   BILITOT 0.5 03/31/2016   ALKPHOS 60 03/31/2016   AST 19 03/31/2016   ALT 27 03/31/2016   PROT 7.3 03/31/2016   ALBUMIN 4.5 03/31/2016   CALCIUM 9.2 07/16/2017   Lab Results  Component Value Date   CHOL 210 (H) 07/16/2017   Lab Results  Component Value Date   HDL 50 07/16/2017   Lab Results  Component Value Date   LDLCALC 131 (H) 07/16/2017   Lab Results  Component Value Date   TRIG 145 07/16/2017   Lab Results  Component Value Date   CHOLHDL 4.2 07/16/2017   Lab Results  Component Value Date   HGBA1C 5.3 01/25/2015      Assessment & Plan:   Problem List Items Addressed This Visit    Anxiety state   Relevant Medications   diazepam (VALIUM) 10 MG tablet   BPH (benign prostatic hyperplasia)   Relevant Orders   PSA   Encounter for hepatitis C screening test for low risk patient   Relevant Orders   Hepatitis C antibody   Hyperlipidemia   Relevant Orders   Lipid panel   Hypertension   Relevant Orders   CMP14+EGFR   Insomnia   Relevant Medications   zolpidem (AMBIEN) 10 MG tablet   PAIN IN JOINT, ANKLE AND FOOT   Relevant Orders   Ambulatory referral to Orthopedic Surgery   PLANTAR FASCIITIS, BILATERAL   Relevant Medications   traMADol (ULTRAM) 50 MG tablet   Preventative health care    Behind on multiple HPDP Has colonoscopy scheduler ed      Relevant Orders   Hepatitis B core antibody, total   Screening for HIV without presence of risk factors   Relevant Orders   HIV Antibody (routine testing w rflx)    Other Visit Diagnoses    Vaccine for diphtheria-tetanus-pertussis, combined    -  Primary   Relevant Orders   Tdap vaccine greater than or equal to 58yo IM (Completed)   Need for immunization against influenza       Relevant Orders   Flu Vaccine QUAD 36+ mos IM (Completed)      Meds ordered this encounter  Medications  . diazepam (VALIUM) 10 MG tablet    Sig: Take 1 tablet (10 mg total) by mouth 3 (three) times daily.    Dispense:  270 tablet    Refill:  1    Not to exceed 5 additional fills before 01/26/2020.  Marland Kitchen zolpidem (AMBIEN) 10 MG tablet    Sig: TAKE 1 TABLET BY MOUTH AT BEDTIME AS NEEDED FOR SLEEP    Dispense:  90 tablet    Refill:  1    This request is for a new prescription for a controlled substance as required by Federal/State law. DX Code Needed  .  . traMADol (ULTRAM) 50 MG tablet    Sig: Take 1 tablet (50 mg total) by mouth every 8 (eight) hours as needed.    Dispense:  270 tablet    Refill:  1    Not to  exceed 4 additional fills before 11/04/2019 DX Code Needed  .    Follow-up: No follow-ups on file.    Zenia Resides, MD

## 2019-09-15 NOTE — Assessment & Plan Note (Signed)
Low risk screen x 1

## 2019-09-15 NOTE — Assessment & Plan Note (Signed)
Diazepam controls and he does not ask for any escalation.  Refill x 6 months.  We discussed moderate concern for three chronic controled substances.

## 2019-09-15 NOTE — Assessment & Plan Note (Signed)
Ortho referral.  Refilled tramadol.   Refill x 6 months.  We discussed moderate concern for three chronic controled substances.

## 2019-09-15 NOTE — Assessment & Plan Note (Signed)
Well controled. 

## 2019-09-15 NOTE — Assessment & Plan Note (Signed)
Low risk.  Screen x 1 °

## 2019-09-16 ENCOUNTER — Other Ambulatory Visit: Payer: Self-pay | Admitting: Family Medicine

## 2019-09-16 DIAGNOSIS — E785 Hyperlipidemia, unspecified: Secondary | ICD-10-CM

## 2019-09-16 LAB — CMP14+EGFR
ALT: 27 IU/L (ref 0–44)
AST: 19 IU/L (ref 0–40)
Albumin/Globulin Ratio: 1.7 (ref 1.2–2.2)
Albumin: 4.4 g/dL (ref 3.8–4.9)
Alkaline Phosphatase: 57 IU/L (ref 39–117)
BUN/Creatinine Ratio: 12 (ref 9–20)
BUN: 15 mg/dL (ref 6–24)
Bilirubin Total: 0.5 mg/dL (ref 0.0–1.2)
CO2: 23 mmol/L (ref 20–29)
Calcium: 9.7 mg/dL (ref 8.7–10.2)
Chloride: 102 mmol/L (ref 96–106)
Creatinine, Ser: 1.22 mg/dL (ref 0.76–1.27)
GFR calc Af Amer: 75 mL/min/{1.73_m2} (ref >59)
GFR calc non Af Amer: 65 mL/min/{1.73_m2} (ref >59)
Globulin, Total: 2.6 g/dL (ref 1.5–4.5)
Glucose: 98 mg/dL (ref 65–99)
Potassium: 4.6 mmol/L (ref 3.5–5.2)
Sodium: 138 mmol/L (ref 134–144)
Total Protein: 7 g/dL (ref 6.0–8.5)

## 2019-09-16 LAB — LIPID PANEL
Chol/HDL Ratio: 5.7 ratio — ABNORMAL HIGH (ref 0.0–5.0)
Cholesterol, Total: 239 mg/dL — ABNORMAL HIGH (ref 100–199)
HDL: 42 mg/dL (ref >39)
LDL Chol Calc (NIH): 165 mg/dL — ABNORMAL HIGH (ref 0–99)
Triglycerides: 172 mg/dL — ABNORMAL HIGH (ref 0–149)
VLDL Cholesterol Cal: 32 mg/dL (ref 5–40)

## 2019-09-16 LAB — HEPATITIS C ANTIBODY: Hep C Virus Ab: <0.1 s/co ratio (ref 0.0–0.9)

## 2019-09-16 LAB — HEPATITIS B CORE ANTIBODY, TOTAL: Hep B Core Total Ab: NEGATIVE

## 2019-09-16 LAB — PSA: Prostate Specific Ag, Serum: 2.3 ng/mL (ref 0.0–4.0)

## 2019-09-16 LAB — HIV ANTIBODY (ROUTINE TESTING W REFLEX): HIV Screen 4th Generation wRfx: NONREACTIVE

## 2019-09-16 MED ORDER — PRAVASTATIN SODIUM 40 MG PO TABS: 40.0000 mg | ORAL_TABLET | Freq: Every day | ORAL | 3 refills | Status: DC

## 2019-09-16 NOTE — Progress Notes (Signed)
Called and discussed lipid panel.  Jointly, we decided on statin therapy based on lifetime risk and high absolute LDL.

## 2019-09-16 NOTE — Addendum Note (Signed)
Addended by: Zenia Resides on: 09/16/2019 10:01 AM   Modules accepted: Orders

## 2019-09-18 LAB — HEPATITIS B SURFACE ANTIBODY, QUANTITATIVE: Hepatitis B Surf Ab Quant: 20.3 m[IU]/mL (ref >9.9)

## 2019-09-18 LAB — SPECIMEN STATUS REPORT

## 2019-09-29 ENCOUNTER — Ambulatory Visit (AMBULATORY_SURGERY_CENTER): Payer: Self-pay

## 2019-09-29 ENCOUNTER — Other Ambulatory Visit: Payer: Self-pay

## 2019-09-29 VITALS — Temp 96.4°F | Ht 70.0 in | Wt 204.0 lb

## 2019-09-29 DIAGNOSIS — Z86010 Personal history of colonic polyps: Secondary | ICD-10-CM

## 2019-09-29 MED ORDER — NA SULFATE-K SULFATE-MG SULF 17.5-3.13-1.6 GM/177ML PO SOLN: 1.0000 | Freq: Once | ORAL | 0 refills | Status: AC

## 2019-09-29 NOTE — Progress Notes (Signed)
Denies allergies to eggs or soy products. Denies complication of anesthesia or sedation. Denies use of weight loss medication. Denies use of O2.   Emmi instructions given for colonoscopy.  Covid screening is scheduled for 10/30/19 @ 10:30 Am.  A 15.00 coupon for Suprep was given to the patient.

## 2019-10-13 ENCOUNTER — Encounter: Payer: BLUE CROSS/BLUE SHIELD | Admitting: Internal Medicine

## 2019-10-21 ENCOUNTER — Encounter: Payer: Self-pay | Admitting: Internal Medicine

## 2019-11-04 ENCOUNTER — Encounter: Payer: BC Managed Care – PPO | Admitting: Internal Medicine

## 2019-11-11 ENCOUNTER — Ambulatory Visit (INDEPENDENT_AMBULATORY_CARE_PROVIDER_SITE_OTHER): Payer: BC Managed Care – PPO

## 2019-11-11 ENCOUNTER — Other Ambulatory Visit: Payer: Self-pay | Admitting: Internal Medicine

## 2019-11-11 DIAGNOSIS — Z1159 Encounter for screening for other viral diseases: Secondary | ICD-10-CM

## 2019-11-11 LAB — SARS CORONAVIRUS 2 (TAT 6-24 HRS): SARS Coronavirus 2: NEGATIVE

## 2019-11-13 ENCOUNTER — Encounter: Payer: Self-pay | Admitting: Internal Medicine

## 2019-11-13 ENCOUNTER — Ambulatory Visit (AMBULATORY_SURGERY_CENTER): Payer: BC Managed Care – PPO | Admitting: Internal Medicine

## 2019-11-13 ENCOUNTER — Other Ambulatory Visit: Payer: Self-pay

## 2019-11-13 VITALS — BP 124/80 | HR 72 | Temp 97.9°F | Resp 21 | Ht 70.0 in | Wt 204.0 lb

## 2019-11-13 DIAGNOSIS — K635 Polyp of colon: Secondary | ICD-10-CM

## 2019-11-13 DIAGNOSIS — D120 Benign neoplasm of cecum: Secondary | ICD-10-CM

## 2019-11-13 DIAGNOSIS — Z86010 Personal history of colonic polyps: Secondary | ICD-10-CM

## 2019-11-13 DIAGNOSIS — Z1211 Encounter for screening for malignant neoplasm of colon: Secondary | ICD-10-CM | POA: Diagnosis not present

## 2019-11-13 MED ORDER — SODIUM CHLORIDE 0.9 % IV SOLN: 500.0000 mL | Freq: Once | INTRAVENOUS | Status: DC

## 2019-11-13 NOTE — Progress Notes (Signed)
Report given to PACU, vss 

## 2019-11-13 NOTE — Progress Notes (Signed)
Called to room to assist during endoscopic procedure.  Patient ID and intended procedure confirmed with present staff. Received instructions for my participation in the procedure from the performing physician.  

## 2019-11-13 NOTE — Op Note (Signed)
Jason Williams Patient Name: Jason Williams Procedure Date: 11/13/2019 10:48 AM MRN: [id number] Endoscopist: Jerene Bears , MD Age: 58 Referring MD:  Date of Birth: [birth year] Gender: Male Account #: [id number] Procedure:                Colonoscopy Indications:              High risk colon cancer surveillance: Personal                            history of non-advanced adenoma, Last colonoscopy:                            February 2013 Medicines:                Monitored Anesthesia Care Procedure:                Pre-Anesthesia Assessment:                           - Prior to the procedure, a History and Physical                            was performed, and patient medications and                            allergies were reviewed. The patient's tolerance of                            previous anesthesia was also reviewed. The risks                            and benefits of the procedure and the sedation                            options and risks were discussed with the patient.                            All questions were answered, and informed consent                            was obtained. Prior Anticoagulants: The patient has                            taken no previous anticoagulant or antiplatelet                            agents. ASA Grade Assessment: II - A patient with                            mild systemic disease. After reviewing the risks                            and benefits, the patient was deemed in  satisfactory condition to undergo the procedure.                           After obtaining informed consent, the colonoscope                            was passed under direct vision. Throughout the                            procedure, the patient's blood pressure, pulse, and                            oxygen saturations were monitored continuously. The                            Colonoscope was introduced through the anus and                           advanced to the the cecum, identified by                            appendiceal orifice and ileocecal valve. The                            colonoscopy was performed without difficulty. The                            patient tolerated the procedure well. The quality                            of the bowel preparation was good. The ileocecal                            valve, appendiceal orifice, and rectum were                            photographed. Scope In: 11:07:24 AM Scope Out: 11:26:45 AM Scope Withdrawal Time: 0 hours 16 minutes 5 seconds  Total Procedure Duration: 0 hours 19 minutes 21 seconds  Findings:                 The digital rectal exam was normal.                           A 4 mm polyp was found in the cecum. The polyp was                            sessile. The polyp was removed with a cold snare.                            Resection and retrieval were complete.                           The exam was otherwise without abnormality on  direct and retroflexion views. Complications:            No immediate complications. Estimated Blood Loss:     Estimated blood loss was minimal. Impression:               - One 4 mm polyp in the cecum, removed with a cold                            snare. Resected and retrieved.                           - The examination was otherwise normal on direct                            and retroflexion views. Recommendation:           - Patient has a contact number available for                            emergencies. The signs and symptoms of potential                            delayed complications were discussed with the                            patient. Return to normal activities tomorrow.                            Written discharge instructions were provided to the                            patient.                           - Resume previous diet.                           - Continue  present medications.                           - Await pathology results.                           - Repeat colonoscopy is recommended for                            surveillance. The colonoscopy date will be                            determined after pathology results from today's                            exam become available for review. Jerene Bears, MD 11/13/2019 11:29:22 AM This report has been signed electronically.

## 2019-11-13 NOTE — Progress Notes (Signed)
Temp-LC VS-CW  Pt's states no medical or surgical changes since previsit or office visit.  

## 2019-11-13 NOTE — Patient Instructions (Signed)
YOU HAD AN ENDOSCOPIC PROCEDURE TODAY AT THE [facility]:   Refer to the procedure report that was given to you for any specific questions about what was found during the examination.  If the procedure report does not answer your questions, please call your gastroenterologist to clarify.  If you requested that your care partner not be given the details of your procedure findings, then the procedure report has been included in a sealed envelope for you to review at your convenience later.  YOU SHOULD EXPECT: Some feelings of bloating in the abdomen. Passage of more gas than usual.  Walking can help get rid of the air that was put into your GI tract during the procedure and reduce the bloating. If you had a lower endoscopy (such as a colonoscopy or flexible sigmoidoscopy) you may notice spotting of blood in your stool or on the toilet paper. If you underwent a bowel prep for your procedure, you may not have a normal bowel movement for a few days.  Please Note:  You might notice some irritation and congestion in your nose or some drainage.  This is from the oxygen used during your procedure.  There is no need for concern and it should clear up in a day or so.  SYMPTOMS TO REPORT IMMEDIATELY:   Following lower endoscopy (colonoscopy or flexible sigmoidoscopy):  Excessive amounts of blood in the stool  Significant tenderness or worsening of abdominal pains  Swelling of the abdomen that is new, acute  Fever of 100F or higher  For urgent or emergent issues, a gastroenterologist can be reached at any hour by calling [phone].   DIET:  We do recommend a small meal at first, but then you may proceed to your regular diet.  Drink plenty of fluids but you should avoid alcoholic beverages for 24 hours.  ACTIVITY:  You should plan to take it easy for the rest of today and you should NOT DRIVE or use heavy machinery until tomorrow (because of the sedation medicines used during the test).     FOLLOW UP: Our staff will call the number listed on your records 48-72 hours following your procedure to check on you and address any questions or concerns that you may have regarding the information given to you following your procedure. If we do not reach you, we will leave a message.  We will attempt to reach you two times.  During this call, we will ask if you have developed any symptoms of COVID 19. If you develop any symptoms (ie: fever, flu-like symptoms, shortness of breath, cough etc.) before then, please call [phone].  If you test positive for Covid 19 in the 2 weeks post procedure, please call and report this information to us.    If any biopsies were taken you will be contacted by phone or by letter within the next 1-3 weeks.  Please call us at [phone] if you have not heard about the biopsies in 3 weeks.    SIGNATURES/CONFIDENTIALITY: You and/or your care partner have signed paperwork which will be entered into your electronic medical record.  These signatures attest to the fact that that the information above on your After Visit Summary has been reviewed and is understood.  Full responsibility of the confidentiality of this discharge information lies with you and/or your care-partner. 

## 2019-11-17 ENCOUNTER — Telehealth: Payer: Self-pay

## 2019-11-17 NOTE — Telephone Encounter (Signed)
Called [phone] and left a message we tried to reach pt for a follow up call. maw

## 2019-11-17 NOTE — Telephone Encounter (Signed)
Second follow up attempt, no answer.

## 2019-11-19 ENCOUNTER — Encounter: Payer: Self-pay | Admitting: Internal Medicine

## 2019-12-20 ENCOUNTER — Other Ambulatory Visit: Payer: Self-pay | Admitting: Family Medicine

## 2020-03-19 ENCOUNTER — Ambulatory Visit (INDEPENDENT_AMBULATORY_CARE_PROVIDER_SITE_OTHER): Payer: BC Managed Care – PPO | Admitting: Family Medicine

## 2020-03-19 ENCOUNTER — Other Ambulatory Visit: Payer: Self-pay

## 2020-03-19 ENCOUNTER — Encounter: Payer: Self-pay | Admitting: Family Medicine

## 2020-03-19 VITALS — BP 130/72 | HR 74 | Wt 204.6 lb

## 2020-03-19 DIAGNOSIS — M722 Plantar fascial fibromatosis: Secondary | ICD-10-CM

## 2020-03-19 DIAGNOSIS — E785 Hyperlipidemia, unspecified: Secondary | ICD-10-CM

## 2020-03-19 DIAGNOSIS — I10 Essential (primary) hypertension: Secondary | ICD-10-CM

## 2020-03-19 DIAGNOSIS — G4700 Insomnia, unspecified: Secondary | ICD-10-CM | POA: Diagnosis not present

## 2020-03-19 DIAGNOSIS — F411 Generalized anxiety disorder: Secondary | ICD-10-CM | POA: Diagnosis not present

## 2020-03-19 DIAGNOSIS — N529 Male erectile dysfunction, unspecified: Secondary | ICD-10-CM

## 2020-03-19 MED ORDER — SILDENAFIL CITRATE 20 MG PO TABS: ORAL_TABLET | ORAL | 0 refills | Status: DC

## 2020-03-19 MED ORDER — ZOLPIDEM TARTRATE 10 MG PO TABS: ORAL_TABLET | ORAL | 2 refills | Status: DC

## 2020-03-19 MED ORDER — AMLODIPINE BESYLATE 10 MG PO TABS: 10.0000 mg | ORAL_TABLET | Freq: Every day | ORAL | 2 refills | Status: DC

## 2020-03-19 MED ORDER — TRAMADOL HCL 50 MG PO TABS: 50.0000 mg | ORAL_TABLET | Freq: Three times a day (TID) | ORAL | 2 refills | Status: AC | PRN

## 2020-03-19 MED ORDER — DIAZEPAM 10 MG PO TABS: 10.0000 mg | ORAL_TABLET | Freq: Three times a day (TID) | ORAL | 2 refills | Status: AC

## 2020-03-19 MED ORDER — PRAVASTATIN SODIUM 40 MG PO TABS: 40.0000 mg | ORAL_TABLET | Freq: Every day | ORAL | 3 refills | Status: DC

## 2020-03-19 NOTE — Progress Notes (Signed)
    SUBJECTIVE:   CHIEF COMPLAINT / HPI:   Annual check-in and refill of chronic labs. PDMP was reviewed and no significant issues found. Patient is still working in his same job and our requirements have gone up since [employer]. He says he has no needs or symptoms and was only here because he needed to be to get his medications refilled. He denies any significant questions  PERTINENT  PMH / PSH:   OBJECTIVE:   BP 130/72   Pulse 74   Wt 204 lb 9.6 oz (92.8 kg)   SpO2 100%   BMI 29.36 kg/m   General: Alert pleasant no distress  ASSESSMENT/PLAN:   ED Sildenafil is working for him and he has no new complaints.  Insomnia Has been using Ambien long-term, PDMP is reviewed and willing to continue these medications.  Anxiety state We'll refill this chronic medication, patient not asking for any increase and feels this is helping him function. PDMP reviewed  PLANTAR FASCIITIS, BILATERAL Unchanged, only really has significant pain if he is walking around for long periods of time which often happens at work. No gait disturbance  Hypertension Well-controlled, no indication for change at this time. No concerning symptoms     Sherene Sires, [place]

## 2020-03-20 NOTE — Assessment & Plan Note (Signed)
Sildenafil is working for him and he has no new complaints.

## 2020-03-20 NOTE — Assessment & Plan Note (Signed)
Has been using Ambien long-term, PDMP is reviewed and willing to continue these medications.

## 2020-03-20 NOTE — Patient Instructions (Signed)
Patient declines AVS, he says he is in a hurry

## 2020-03-20 NOTE — Assessment & Plan Note (Signed)
Well-controlled, no indication for change at this time. No concerning symptoms

## 2020-03-20 NOTE — Assessment & Plan Note (Signed)
Unchanged, only really has significant pain if he is walking around for long periods of time which often happens at work. No gait disturbance

## 2020-03-20 NOTE — Assessment & Plan Note (Signed)
We'll refill this chronic medication, patient not asking for any increase and feels this is helping him function. PDMP reviewed

## 2020-03-24 ENCOUNTER — Telehealth: Payer: Self-pay | Admitting: *Deleted

## 2020-03-24 NOTE — Telephone Encounter (Signed)
Med denied.  See below.

## 2020-03-24 NOTE — Telephone Encounter (Signed)
Received fax from pharmacy, PA needed on sildenafil.  Clinical questions submitted via Cover My Meds.  Waiting on response, could take up to 72 hours.  Cover My Meds info: Key: [id number]  Christen Bame, CMA

## 2020-03-24 NOTE — Telephone Encounter (Signed)
Opened in error.  Christen Bame, CMA

## 2020-04-27 ENCOUNTER — Other Ambulatory Visit: Payer: Self-pay | Admitting: Family Medicine

## 2020-04-27 DIAGNOSIS — N529 Male erectile dysfunction, unspecified: Secondary | ICD-10-CM

## 2020-05-25 ENCOUNTER — Other Ambulatory Visit: Payer: Self-pay | Admitting: Urology

## 2020-06-16 ENCOUNTER — Other Ambulatory Visit: Payer: Self-pay | Admitting: Family Medicine

## 2020-06-16 DIAGNOSIS — G4700 Insomnia, unspecified: Secondary | ICD-10-CM

## 2020-07-14 ENCOUNTER — Other Ambulatory Visit: Payer: Self-pay

## 2020-07-14 MED ORDER — DIAZEPAM 10 MG PO TABS
ORAL_TABLET | ORAL | 0 refills | Status: DC
Start: 2020-07-14 — End: 2020-08-13

## 2020-07-14 MED ORDER — TRAMADOL HCL 50 MG PO TABS: 50.0000 mg | ORAL_TABLET | Freq: Three times a day (TID) | ORAL | 0 refills | Status: DC | PRN

## 2020-08-11 ENCOUNTER — Other Ambulatory Visit: Payer: Self-pay | Admitting: Family Medicine

## 2020-09-08 ENCOUNTER — Other Ambulatory Visit: Payer: Self-pay

## 2020-09-08 DIAGNOSIS — G4700 Insomnia, unspecified: Secondary | ICD-10-CM

## 2020-09-10 MED ORDER — DIAZEPAM 10 MG PO TABS: ORAL_TABLET | ORAL | 0 refills | Status: DC

## 2020-09-10 MED ORDER — TRAMADOL HCL 50 MG PO TABS: 50.0000 mg | ORAL_TABLET | Freq: Three times a day (TID) | ORAL | 0 refills | Status: DC | PRN

## 2020-09-10 MED ORDER — ZOLPIDEM TARTRATE 10 MG PO TABS: ORAL_TABLET | ORAL | 0 refills | Status: DC

## 2020-09-13 MED ORDER — DIAZEPAM 10 MG PO TABS: ORAL_TABLET | ORAL | 0 refills | Status: DC

## 2020-09-13 MED ORDER — TRAMADOL HCL 50 MG PO TABS: 50.0000 mg | ORAL_TABLET | Freq: Three times a day (TID) | ORAL | 0 refills | Status: DC | PRN

## 2020-09-13 MED ORDER — ZOLPIDEM TARTRATE 10 MG PO TABS: ORAL_TABLET | ORAL | 0 refills | Status: DC

## 2020-09-13 NOTE — Addendum Note (Signed)
Addended by: Dorna Bloom on: 09/13/2020 12:12 PM   Modules accepted: Orders

## 2020-09-13 NOTE — Telephone Encounter (Signed)
Due to system wide e-prescribing issues on 10/15, the pharmacy never received prescriptions. Please resend.

## 2020-09-28 ENCOUNTER — Ambulatory Visit: Payer: BC Managed Care – PPO | Admitting: Family Medicine

## 2020-10-12 ENCOUNTER — Ambulatory Visit (INDEPENDENT_AMBULATORY_CARE_PROVIDER_SITE_OTHER): Payer: BC Managed Care – PPO | Admitting: Family Medicine

## 2020-10-12 ENCOUNTER — Encounter: Payer: Self-pay | Admitting: Family Medicine

## 2020-10-12 ENCOUNTER — Other Ambulatory Visit: Payer: Self-pay

## 2020-10-12 VITALS — BP 112/68 | HR 77 | Ht 70.0 in | Wt 208.6 lb

## 2020-10-12 DIAGNOSIS — I10 Essential (primary) hypertension: Secondary | ICD-10-CM | POA: Diagnosis not present

## 2020-10-12 DIAGNOSIS — Z79899 Other long term (current) drug therapy: Secondary | ICD-10-CM | POA: Diagnosis not present

## 2020-10-12 DIAGNOSIS — G4700 Insomnia, unspecified: Secondary | ICD-10-CM

## 2020-10-12 DIAGNOSIS — F411 Generalized anxiety disorder: Secondary | ICD-10-CM

## 2020-10-12 DIAGNOSIS — E1169 Type 2 diabetes mellitus with other specified complication: Secondary | ICD-10-CM | POA: Diagnosis not present

## 2020-10-12 DIAGNOSIS — M722 Plantar fascial fibromatosis: Secondary | ICD-10-CM

## 2020-10-12 DIAGNOSIS — E785 Hyperlipidemia, unspecified: Secondary | ICD-10-CM | POA: Diagnosis not present

## 2020-10-12 MED ORDER — TRAMADOL HCL 50 MG PO TABS: 50.0000 mg | ORAL_TABLET | Freq: Three times a day (TID) | ORAL | 2 refills | Status: DC | PRN

## 2020-10-12 MED ORDER — ZOLPIDEM TARTRATE 10 MG PO TABS: ORAL_TABLET | ORAL | 0 refills | Status: DC

## 2020-10-12 MED ORDER — DIAZEPAM 10 MG PO TABS
ORAL_TABLET | ORAL | 2 refills | Status: DC
Start: 2020-10-12 — End: 2020-12-07

## 2020-10-12 MED ORDER — AMLODIPINE BESYLATE 10 MG PO TABS: 10.0000 mg | ORAL_TABLET | Freq: Every day | ORAL | 2 refills | Status: DC

## 2020-10-12 NOTE — Assessment & Plan Note (Signed)
Patient reports that Valium is extremely beneficial to help with his anxiety while at work.  The patient does not want an increase in reports the current dose is helping him function.  PDMP reviewed. -Refill sent to pharmacy -Urine drug screen collected today

## 2020-10-12 NOTE — Assessment & Plan Note (Signed)
Patient currently compliant with Ultram for which she has been on long-term for these issues.  We will continue current medications for the pain.  Need to continue to encourage cessation.

## 2020-10-12 NOTE — Assessment & Plan Note (Signed)
Has been using long-term Ambien.  Reports that this medication is helping with the sleep and would like to continue.  PDMP reviewed.  We will continue this medication.  Did discuss CBT to help with sleep because as he gets older this medication will not be able to be prescribed.

## 2020-10-12 NOTE — Assessment & Plan Note (Signed)
Well-controlled blood pressure at this time.  Refill sent to patient's pharmacy.  Checking BMP at this time to assess kidney function.

## 2020-10-12 NOTE — Progress Notes (Signed)
    SUBJECTIVE:   CHIEF COMPLAINT / HPI:   Medication management Patient presents because he needs a refill on his medications.  Reports that he takes Ambien to help with sleep.  Says that he can sleep for approximately 4 hours and he wakes up but then can go back to sleep for another few hours.  Regarding his pain it is all related to osteoarthritis and is in his knees and ankles.  He reports that the Ultram is the only way he can get through the day with his work.  He reports that the Valium is used to help with his anxiety at work.  He has issues with claustrophobia as well as anxiety with many people in the workplace and the only thing that helps with this is the Valium.  OBJECTIVE:   BP 112/68   Pulse 77   Ht 5\' 10"  (1.778 m)   Wt 208 lb 9.6 oz (94.6 kg)   SpO2 97%   BMI 29.93 kg/m   General: Calm, well-appearing 59 year old male, no acute distress Cardiac: Regular rate and rhythm, no murmurs appreciated Respiratory: Normal work of breathing, lungs clear to auscultation bilaterally Abdomen: Soft, nontender, positive bowel sounds  ASSESSMENT/PLAN:   Hypertension Well-controlled blood pressure at this time.  Refill sent to patient's pharmacy.  Checking BMP at this time to assess kidney function.  Insomnia Has been using long-term Ambien.  Reports that this medication is helping with the sleep and would like to continue.  PDMP reviewed.  We will continue this medication.  Did discuss CBT to help with sleep because as he gets older this medication will not be able to be prescribed.  Anxiety state Patient reports that Valium is extremely beneficial to help with his anxiety while at work.  The patient does not want an increase in reports the current dose is helping him function.  PDMP reviewed. -Refill sent to pharmacy -Urine drug screen collected today  PLANTAR FASCIITIS, BILATERAL Patient currently compliant with Ultram for which she has been on long-term for these issues.  We  will continue current medications for the pain.  Need to continue to encourage cessation.    Gifford Shave, MD [place]

## 2020-10-12 NOTE — Patient Instructions (Signed)
It was a pleasure to meet you today.  I am glad that you are medications are helping with your pain and sleep.  I have sent refills for all of the medications requested.  Given your current medication regimen I would like you seen every 3 months.  I am going to check your cholesterol today as well as your kidney function.  If we need to make any medication changes I will call you to discuss this.  If there are any abnormal results I will also call you.  If you have any questions, concerns, issues please call the clinic.  I hope you have a wonderful afternoon!

## 2020-10-13 LAB — LIPID PANEL
Chol/HDL Ratio: 6.1 ratio — ABNORMAL HIGH (ref 0.0–5.0)
Cholesterol, Total: 250 mg/dL — ABNORMAL HIGH (ref 100–199)
HDL: 41 mg/dL (ref >39)
LDL Chol Calc (NIH): 166 mg/dL — ABNORMAL HIGH (ref 0–99)
Triglycerides: 230 mg/dL — ABNORMAL HIGH (ref 0–149)
VLDL Cholesterol Cal: 43 mg/dL — ABNORMAL HIGH (ref 5–40)

## 2020-10-13 LAB — BASIC METABOLIC PANEL
BUN/Creatinine Ratio: 12 (ref 9–20)
BUN: 13 mg/dL (ref 6–24)
CO2: 25 mmol/L (ref 20–29)
Calcium: 9.5 mg/dL (ref 8.7–10.2)
Chloride: 102 mmol/L (ref 96–106)
Creatinine, Ser: 1.06 mg/dL (ref 0.76–1.27)
GFR calc Af Amer: 88 mL/min/{1.73_m2} (ref >59)
GFR calc non Af Amer: 76 mL/min/{1.73_m2} (ref >59)
Glucose: 95 mg/dL (ref 65–99)
Potassium: 4.7 mmol/L (ref 3.5–5.2)
Sodium: 139 mmol/L (ref 134–144)

## 2020-10-13 LAB — MED LIST OPTION NOT SELECTED

## 2020-10-15 ENCOUNTER — Telehealth: Payer: Self-pay | Admitting: Family Medicine

## 2020-10-15 LAB — DRUG SCREEN 764883 11+OXYCO+ALC+CRT-BUND

## 2020-10-15 MED ORDER — ROSUVASTATIN CALCIUM 10 MG PO TABS: 10.0000 mg | ORAL_TABLET | Freq: Every day | ORAL | 3 refills | Status: DC

## 2020-10-15 NOTE — Telephone Encounter (Signed)
Called patient regarding lipid panel.  Elevated total cholesterol as well as LDL DL.  Discussed these results in calculated ASCVD risk.  It was 10.5 meaning patient is indicated to start moderate to high intensity statin.  Patient reports he has been tried on multiple statins in the past but had myalgias.  I discussed starting Crestor and patient is agreeable to this.  We start 10 mg Crestor daily.  We will recheck his LDL in 3 months at his follow-up visit.  Patient has no other questions or concerns.

## 2020-10-18 LAB — DRUG SCREEN 764883 11+OXYCO+ALC+CRT-BUND

## 2020-10-19 LAB — DRUG SCREEN 764883 11+OXYCO+ALC+CRT-BUND

## 2020-10-25 LAB — DRUG SCREEN 764883 11+OXYCO+ALC+CRT-BUND
Amphetamines, Urine: NEGATIVE ng/mL
Barbiturate: NEGATIVE ng/mL
Cannabinoid Quant, Ur: NEGATIVE ng/mL
Cocaine (Metabolite): NEGATIVE ng/mL
Creatinine: 118.3 mg/dL (ref 20.0–300.0)
Ethanol: NEGATIVE %
Meperidine: NEGATIVE ng/mL
Methadone Screen, Urine: NEGATIVE ng/mL
OPIATE SCREEN URINE: NEGATIVE ng/mL
Oxycodone/Oxymorphone, Urine: NEGATIVE ng/mL
Phencyclidine: NEGATIVE ng/mL
Propoxyphene: NEGATIVE ng/mL
pH, Urine: 5.2 (ref 4.5–8.9)

## 2020-10-25 LAB — BENZODIAZEPINES CONFIRM, URINE
Alprazolam: NEGATIVE
Benzodiazepines: POSITIVE ng/mL — AB
Clonazepam: NEGATIVE
Flurazepam: NEGATIVE
Lorazepam: NEGATIVE
Midazolam: NEGATIVE
Nordiazepam Conf: 1398 ng/mL
Nordiazepam: POSITIVE — AB
Oxazepam Conf: 2308 ng/mL
Oxazepam: POSITIVE — AB
Temazepam Conf.: 1850 ng/mL
Temazepam: POSITIVE — AB
Triazolam: NEGATIVE

## 2020-10-25 LAB — TRAMADOL GC/MS, URINE
Tramadol gc/ms Conf: 30525 ng/mL
Tramadol: POSITIVE — AB

## 2020-10-25 LAB — TOXASSURE SELECT 13 (MW), URINE

## 2020-11-27 ENCOUNTER — Other Ambulatory Visit: Payer: Self-pay | Admitting: Urology

## 2020-12-07 ENCOUNTER — Other Ambulatory Visit: Payer: Self-pay

## 2020-12-07 ENCOUNTER — Other Ambulatory Visit: Payer: Self-pay | Admitting: Urology

## 2020-12-07 DIAGNOSIS — G4700 Insomnia, unspecified: Secondary | ICD-10-CM

## 2020-12-07 NOTE — Telephone Encounter (Signed)
Patient calls nurse line requesting medication refills. Patient reminded to make an apt for 3 month FU ~2/15. Patient states he will call back to schedule.

## 2020-12-08 MED ORDER — TRAMADOL HCL 50 MG PO TABS: 50.0000 mg | ORAL_TABLET | Freq: Three times a day (TID) | ORAL | 2 refills | Status: DC | PRN

## 2020-12-08 MED ORDER — ZOLPIDEM TARTRATE 10 MG PO TABS: ORAL_TABLET | ORAL | 0 refills | Status: DC

## 2020-12-08 MED ORDER — DIAZEPAM 10 MG PO TABS
ORAL_TABLET | ORAL | 2 refills | Status: DC
Start: 2020-12-08 — End: 2021-03-15

## 2020-12-20 ENCOUNTER — Telehealth: Payer: Self-pay | Admitting: Urology

## 2020-12-20 NOTE — Telephone Encounter (Signed)
Patient called and LM that he needs a refill, he did not leave medication info.

## 2020-12-22 ENCOUNTER — Other Ambulatory Visit: Payer: Self-pay

## 2020-12-22 ENCOUNTER — Ambulatory Visit (INDEPENDENT_AMBULATORY_CARE_PROVIDER_SITE_OTHER): Payer: BC Managed Care – PPO | Admitting: Family Medicine

## 2020-12-22 ENCOUNTER — Encounter: Payer: Self-pay | Admitting: Family Medicine

## 2020-12-22 VITALS — BP 112/70 | HR 72 | Ht 70.0 in | Wt 208.4 lb

## 2020-12-22 DIAGNOSIS — M25561 Pain in right knee: Secondary | ICD-10-CM | POA: Diagnosis not present

## 2020-12-22 MED ORDER — METHYLPREDNISOLONE ACETATE 40 MG/ML IJ SUSP
40.0000 mg | Freq: Once | INTRAMUSCULAR | Status: AC
  Administered 2020-12-22: 40 mg via INTRAMUSCULAR

## 2020-12-22 MED ORDER — TADALAFIL 20 MG PO TABS
20.0000 mg | ORAL_TABLET | ORAL | 0 refills | Status: DC | PRN
Start: 2020-12-22 — End: 2021-06-13

## 2020-12-22 NOTE — Assessment & Plan Note (Signed)
Patient reports he he had multiple days of leg workouts followed by a round of golf and the following morning his right knee was swollen and hurt.  Denies any traumatic injury to the knee.  Symptoms and exam consistent with either meniscal tear versus osteoarthritis flare.  Patient requesting knee injection.  Knee injection given.  Continue supportive measures and if pain does not resolve/improve will obtain imaging starting with an x-ray.  Patient may require an MRI of his right knee.  Strict return precautions given.

## 2020-12-22 NOTE — Patient Instructions (Signed)
It was great seeing you today.  I am sorry you are having these issues with your knee.  I think this is most likely either osteoarthritis of the knee or a meniscal injury.  We gave you an injection today to hopefully help decrease the inflammation in your knee should start to feel better.  If the pain does not improve over the next few days you may need to get imaging which we will start with an x-ray and then you may need an MRI after.  You can continue to take over-the-counter pain medications as needed.  I also sent a refill for your tadalafil to the [facility].  If you have any questions, issues, concerns please feel free to call the clinic.  I hope you have a wonderful afternoon!   Knee Injection A knee injection is a procedure to get medicine into your knee joint to relieve the pain, swelling, and stiffness of arthritis. Your health care provider uses a needle to inject medicine, which may also help to lubricate and cushion your knee joint. You may need more than one injection. Tell a health care provider about:  Any allergies you have.  All medicines you are taking, including vitamins, herbs, eye drops, creams, and over-the-counter medicines.  Any problems you or family members have had with anesthetic medicines.  Any blood disorders you have.  Any surgeries you have had.  Any medical conditions you have.  Whether you are pregnant or may be pregnant. What are the risks? Generally, this is a safe procedure. However, problems may occur, including:  Infection.  Bleeding.  Symptoms that get worse.  Damage to the area around your knee.  Allergic reaction to any of the medicines.  Skin reactions from repeated injections. What happens before the procedure?  Ask your health care provider about: ? Changing or stopping your regular medicines. This is especially important if you are taking diabetes medicines or blood thinners. ? Taking medicines such as aspirin and ibuprofen.  These medicines can thin your blood. Do not take these medicines unless your health care provider tells you to take them. ? Taking over-the-counter medicines, vitamins, herbs, and supplements.  Plan to have a responsible adult take you home from the hospital or clinic. What happens during the procedure?  You will sit or lie down in a position for your knee to be treated.  The skin over your kneecap will be cleaned with a germ-killing soap.  You will be given a medicine that numbs the area (local anesthetic). You may feel some stinging.  The medicine will be injected into your knee. The needle is carefully placed between your kneecap and your knee. The medicine is injected into the joint space.  The needle will be removed at the end of the procedure.  A bandage (dressing) may be placed over the injection site. The procedure may vary among health care providers and hospitals.   What can I expect after the procedure?  Your blood pressure, heart rate, breathing rate, and blood oxygen level will be monitored until you leave the hospital or clinic.  You may have to move your knee through its full range of motion. This helps to get all the medicine into your joint space.  You will be watched to make sure that you do not have a reaction to the injected medicine.  You may feel more pain, swelling, and warmth than you did before the injection. This reaction may last about 1-2 days. Follow these instructions at home:  Medicines  Take over-the-counter and prescription medicines only as told by your health care provider.  Ask your health care provider if the medicine prescribed to you requires you to avoid driving or using machinery.  Do not take medicines such as aspirin and ibuprofen unless your health care provider tells you to take them. Injection site care  Follow instructions from your health care provider about: ? How to take care of your puncture site. ? When and how you should  change your dressing. ? When you should remove your dressing.  Check your injection area every day for signs of infection. Check for: ? More redness, swelling, or pain after 2 days. ? Fluid or blood. ? Pus or a bad smell. ? Warmth. Managing pain, stiffness, and swelling  If directed, put ice on the injection area. To do this: ? Put ice in a plastic bag. ? Place a towel between your skin and the bag. ? Leave the ice on for 20 minutes, 2-3 times per day. ? Remove the ice if your skin turns bright red. This is very important. If you cannot feel pain, heat, or cold, you have a greater risk of damage to the area.  Do not apply heat to your knee.  Raise (elevate) the injection area above the level of your heart while you are sitting or lying down.   General instructions  If you were given a dressing, keep it dry until your health care provider says it can be removed. Ask your health care provider when you can start showering or bathing.  Avoid strenuous activities for as long as directed by your health care provider. Ask your health care provider when you can return to your normal activities.  Keep all follow-up visits. This is important. You may need more injections. Contact a health care provider if you have:  A fever.  Warmth in your injection area.  Fluid, blood, or pus coming from your injection site.  Symptoms at your injection site that last longer than 2 days after your procedure. Get help right away if:  Your knee turns very red.  Your knee becomes very swollen.  Your knee is in severe pain. Summary  A knee injection is a procedure to get medicine into your knee joint to relieve the pain, swelling, and stiffness of arthritis.  A needle is carefully placed between your kneecap and your knee to inject medicine into the joint space.  Before the procedure, ask your health care provider about changing or stopping your regular medicines, especially if you are taking  diabetes medicines or blood thinners.  Contact your health care provider if you have any problems or questions after your procedure. This information is not intended to replace advice given to you by your health care provider. Make sure you discuss any questions you have with your health care provider. Document Revised: 04/28/2020 Document Reviewed: 04/28/2020 Elsevier Patient Education  2021 [employer].

## 2020-12-22 NOTE — Progress Notes (Signed)
    SUBJECTIVE:   CHIEF COMPLAINT / HPI:   Right knee pain Patient reports he exercises daily but that 1 month ago he did several days of hard leg exercises followed by a round of golf.  He woke up the following morning and his right knee has been hurting since.  He reports that initially it was swollen but that he did not have any traumatic injury to the knee.  The swelling is gone down and he has been using ice but the pain is still present.  He has been told he has osteoarthritis but there is also concern for some type of soft tissue injury.  Denies any fever, warmth of the knee.  Would like a knee injection today.  Has not had a knee injection in the past.  Does report that he had bursitis where they drained fluid off of his knee prepatellar bursa.  This was "several years ago".  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   OBJECTIVE:   BP 112/70   Pulse 72   Ht 5\' 10"  (1.778 m)   Wt 208 lb 6.4 oz (94.5 kg)   SpO2 97%   BMI 29.90 kg/m   General: Well-appearing 60 year old male, no acute distress Respiratory: Normal work of breathing MSK:  Right knee exam No gross deformity, ecchymoses.  Mild effusion Tenderness to palpation along the medial joint line all the way to center. FROM. Negative ant/post drawers. Negative varus testing, positive valgus testing.  Negative lachmans. Negative mcmurrays, apleys, patellar apprehension.  Patient unable to perform [facility] due to pain. NV intact distally.   After informed written consent timeout was performed, patient was lying supine on exam table.  Right knee was prepped with alcohol swab.  Knee was then injected with 3:1 lidocaine:depomedrol.  Patient tolerated procedure well without immediate complications   ASSESSMENT/PLAN:   Acute pain of right knee Patient reports he he had multiple days of leg workouts followed by a round of golf and the following morning his  right knee was swollen and hurt.  Denies any traumatic injury to the knee.  Symptoms and exam consistent with either meniscal tear versus osteoarthritis flare.  Patient requesting knee injection.  Knee injection given.  Continue supportive measures and if pain does not resolve/improve will obtain imaging starting with an x-ray.  Patient may require an MRI of his right knee.  Strict return precautions given.     Gifford Shave, MD [place]

## 2020-12-23 NOTE — Telephone Encounter (Signed)
I do not believe  he is our patient. I can't find any records for past appts.

## 2020-12-28 ENCOUNTER — Telehealth: Payer: Self-pay | Admitting: Family Medicine

## 2020-12-28 DIAGNOSIS — M25561 Pain in right knee: Secondary | ICD-10-CM

## 2020-12-28 NOTE — Telephone Encounter (Signed)
Patient is calling to let doctor know he is needing referral for X-ray and MRI. Pain is not getting any better with shot. Please advise. Thanks!

## 2020-12-31 ENCOUNTER — Ambulatory Visit
Admission: RE | Admit: 2020-12-31 | Discharge: 2020-12-31 | Disposition: A | Discharge status: Home / Self Care | Payer: BC Managed Care – PPO | Source: Ambulatory Visit | Attending: Family Medicine | Admitting: Family Medicine

## 2020-12-31 DIAGNOSIS — M25561 Pain in right knee: Secondary | ICD-10-CM

## 2020-12-31 DIAGNOSIS — M1711 Unilateral primary osteoarthritis, right knee: Secondary | ICD-10-CM | POA: Diagnosis not present

## 2021-01-03 NOTE — Addendum Note (Signed)
Addended by: Concepcion Living on: 01/03/2021 11:51 AM   Modules accepted: Orders

## 2021-01-03 NOTE — Telephone Encounter (Signed)
This has already been scheduled.Jason Williams, CMA

## 2021-01-03 NOTE — Telephone Encounter (Signed)
Pt had imaging done on 12/31/20. Jason Williams, CMA

## 2021-01-14 ENCOUNTER — Telehealth: Payer: Self-pay

## 2021-01-14 NOTE — Telephone Encounter (Signed)
Patient calls nurse line stating that he is unable to tolerate statin drug. Patient states that he has been experiencing muscle and joint aches since being on medication. Patient states that he spoke with cardiologist he works with (Dr. Elonda Husky), who recommended statin injection every two weeks (Repatha). Patient is wanting to see if this may be a good alternative for him.   Also, patient has MRI scheduled for Sunday 2/20 for R knee. Patient states that he has been having issues with R Achilles tendon. Patient wanted to see if it was possible to also add this on to current imaging order.   Please advise.   Talbot Grumbling, RN

## 2021-01-14 NOTE — Telephone Encounter (Signed)
Called patient regarding statin intolerance.  He reports that he has been having terrible muscle aches and has stopped taking his statin.  He is interested in injectable cholesterol-lowering medication.  I discussed these medications with our pharmacist and he recommended he see his cardiologist regarding the prescription because the specialist tend to manage these medications.  I called the patient back and he was agreeable to this.  He has been seen at heart care in the past.  I provided him with telephone numbers.  Regarding the Achilles tendon he reports that he has been having issues with it for years but has never brought it up.  Discussed with him that I have not documented any issues with his Achilles in previous visits and therefore cannot extend the imaging to his ankle.  We will follow up on this in the future.

## 2021-01-16 ENCOUNTER — Other Ambulatory Visit: Payer: Self-pay

## 2021-01-16 ENCOUNTER — Ambulatory Visit
Admission: RE | Admit: 2021-01-16 | Discharge: 2021-01-16 | Disposition: A | Discharge status: Home / Self Care | Payer: BC Managed Care – PPO | Source: Ambulatory Visit | Attending: Family Medicine | Admitting: Family Medicine

## 2021-01-16 DIAGNOSIS — M25561 Pain in right knee: Secondary | ICD-10-CM

## 2021-02-08 ENCOUNTER — Telehealth: Payer: Self-pay | Admitting: *Deleted

## 2021-02-08 DIAGNOSIS — M25561 Pain in right knee: Secondary | ICD-10-CM

## 2021-02-08 NOTE — Telephone Encounter (Signed)
Patient called and states that he would like to see sports medicine for his knee pain.  He is still having a lot of pain and feels his walking ability is decreased due to it.  Will forward to MD to place this order. Jazmin Hartsell,CMA

## 2021-02-11 ENCOUNTER — Ambulatory Visit: Payer: BC Managed Care – PPO | Admitting: Family Medicine

## 2021-02-14 ENCOUNTER — Encounter: Payer: Self-pay | Admitting: Family Medicine

## 2021-02-14 ENCOUNTER — Other Ambulatory Visit: Payer: Self-pay

## 2021-02-14 ENCOUNTER — Ambulatory Visit (INDEPENDENT_AMBULATORY_CARE_PROVIDER_SITE_OTHER): Payer: BC Managed Care – PPO | Admitting: Family Medicine

## 2021-02-14 VITALS — BP 152/100 | Ht 70.0 in | Wt 195.0 lb

## 2021-02-14 DIAGNOSIS — M25561 Pain in right knee: Secondary | ICD-10-CM

## 2021-02-14 MED ORDER — METHYLPREDNISOLONE ACETATE 40 MG/ML IJ SUSP
40.0000 mg | Freq: Once | INTRAMUSCULAR | Status: AC
  Administered 2021-02-14: 40 mg via INTRA_ARTICULAR

## 2021-02-14 NOTE — Progress Notes (Signed)
PCP: Gifford Shave, MD  Subjective:   HPI: Patient is a 60 y.o. male here for right knee pain.  Patient reports about 9 weeks ago he recalls lifting weights more intensely over a 1.5 week period. Did ok with this, golfed also then the next day felt fairly severe medial right knee pain. Does not recall an acute injury. Has tried a compressive knee brace, ibuprofen, had knee injection with ultrasound guidance laterally without much benefit to date. Pain is still medial. MRI showed some mild patellofemoral degenerative changes but no medial abnormalities.  Past Medical History:  Diagnosis Date  . Anxiety   . Arthritis   . Depression   . GERD (gastroesophageal reflux disease)   . Heart murmur   . Hyperlipidemia   . Hypertension   . Sleep apnea     Current Outpatient Medications on File Prior to Visit  Medication Sig Dispense Refill  . amLODipine (NORVASC) 10 MG tablet Take 1 tablet (10 mg total) by mouth daily. 90 tablet 2  . aspirin ([facility] ASPIRIN CHILD) 81 MG chewable tablet TAKE 1 TABLET BY MOUTH DAILY TO THIN BLOOD TO HELP TO PREVENT HEART ATTACK AND STROKE    . diazepam (VALIUM) 10 MG tablet TAKE 1 TABLET BY MOUTH THREE TIMES A DAY 90 tablet 2  . rosuvastatin (CRESTOR) 10 MG tablet Take 1 tablet (10 mg total) by mouth daily. 90 tablet 3  . tadalafil (CIALIS) 20 MG tablet Take 1 tablet (20 mg total) by mouth as needed. 30 tablet 0  . traMADol (ULTRAM) 50 MG tablet Take 1 tablet (50 mg total) by mouth every 8 (eight) hours as needed. 90 tablet 2  . zolpidem (AMBIEN) 10 MG tablet TAKE 1 TABLET BY MOUTH EVERY DAY AT BEDTIME AS NEEDED FOR SLEEP 90 tablet 0   No current facility-administered medications on file prior to visit.    Past Surgical History:  Procedure Laterality Date  . ankle re construction  2011   bilateral  . APPENDECTOMY  1981    No Known Allergies  Social History   Socioeconomic History  . Marital status: Single    Spouse name: Not on file  . Number  of children: 1  . Years of education: Not on file  . Highest education level: Not on file  Occupational History  . Occupation: Lobbyist: [employer]  Tobacco Use  . Smoking status: Never Smoker  . Smokeless tobacco: Never Used  Vaping Use  . Vaping Use: Never used  Substance and Sexual Activity  . Alcohol use: Yes    Alcohol/week: 2.0 standard drinks    Types: 2 Glasses of wine per week  . Drug use: No  . Sexual activity: Yes    Birth control/protection: Pill, None    Comment: 1 male  Other Topics Concern  . Not on file  Social History Narrative  . Not on file   Social Determinants of Health   Financial Resource Strain: Not on file  Food Insecurity: Not on file  Transportation Needs: Not on file  Physical Activity: Not on file  Stress: Not on file  Social Connections: Not on file  Intimate Partner Violence: Not on file    Family History  Problem Relation Age of Onset  . Esophageal cancer Mother   . Heart disease Maternal Grandfather        MI/CABG  . Heart attack Maternal Grandfather   . Heart attack Father        Died of  MI at 36  . Healthy Sister   . Colon cancer Neg Hx   . Rectal cancer Neg Hx   . Stomach cancer Neg Hx     BP (!) 152/100   Ht 5\' 10"  (1.778 m)   Wt 195 lb (88.5 kg)   BMI 27.98 kg/m   [facility] 02/14/2021  Frequency of aerobic exercise (# of days/week) 3  Average time in minutes 60  Frequency of strengthening activities (# of days/week) 2    No flowsheet data found.  Review of Systems: See HPI above.     Objective:  Physical Exam:  Gen: NAD, comfortable in exam room  Right knee: No gross deformity, ecchymoses, effusion. TTP medial joint line.  No lateral joint line, post patellar facet, pes tenderness. FROM with normal strength. Negative ant/post drawers. Negative valgus/varus testing. Negative lachman. Positive mcmurrays, apleys, thessalys. NV intact distally.  Limited MSK  u/s right knee:  No obvious tear visualized to articular surface though some hypoechoic change mid medial meniscus.   Assessment & Plan:  1. Right knee pain - Patient's exam is consistent with medial meniscus pathology.  MRI reviewed and there is some increased signal in the medial meniscus without extension to articular surface.  He has at least inflammation of this medial meniscus, possible tear not visualized on MRI.  Discussed options - repeated intraarticular steroid injection today with different approach.  If not improving over the next week will consider ortho referral.  If improving add home exercise program vs PT.  Ibuprofen if needed.  After informed written consent timeout was performed, patient was seated on exam table. Right knee was prepped with alcohol swab and utilizing anteromedial approach, patient's right knee was injected intraarticularly with 4:1 lidocaine: depomedrol. Patient tolerated the procedure well without immediate complications.

## 2021-02-14 NOTE — Patient Instructions (Signed)
Everything about your exam points to the medial meniscus. The MRI shows some inflammation in the meniscus without an obvious tear. We repeated the injection today with a little different technique. Ok to continue the ibuprofen as needed. Icing, knee sleeve/brace as you have been. Call or message me in a week to let me know how you're doing. If not improving I'd recommend seeing the surgeon to discuss a diagnostic arthroscopy.

## 2021-03-14 ENCOUNTER — Other Ambulatory Visit: Payer: Self-pay | Admitting: Family Medicine

## 2021-03-14 DIAGNOSIS — G4700 Insomnia, unspecified: Secondary | ICD-10-CM

## 2021-03-14 NOTE — Telephone Encounter (Signed)
Patient calls nurse line regarding refill request. Patient is requesting that prescriptions be sent over with 3 refills. Previous prescriptions were sent over for initial with two refills.   Please advise.   Talbot Grumbling, RN

## 2021-03-15 ENCOUNTER — Telehealth: Payer: Self-pay | Admitting: *Deleted

## 2021-03-15 ENCOUNTER — Other Ambulatory Visit: Payer: Self-pay | Admitting: Family Medicine

## 2021-03-15 DIAGNOSIS — I10 Essential (primary) hypertension: Secondary | ICD-10-CM

## 2021-03-15 DIAGNOSIS — G4700 Insomnia, unspecified: Secondary | ICD-10-CM

## 2021-03-15 MED ORDER — ZOLPIDEM TARTRATE 10 MG PO TABS
ORAL_TABLET | ORAL | 0 refills | Status: DC
Start: 2021-03-15 — End: 2021-03-15

## 2021-03-15 MED ORDER — TRAMADOL HCL 50 MG PO TABS: ORAL_TABLET | ORAL | 0 refills | Status: DC

## 2021-03-15 MED ORDER — DIAZEPAM 10 MG PO TABS: ORAL_TABLET | ORAL | 0 refills | Status: DC

## 2021-03-15 MED ORDER — ZOLPIDEM TARTRATE 10 MG PO TABS: ORAL_TABLET | ORAL | 0 refills | Status: DC

## 2021-03-15 NOTE — Progress Notes (Signed)
Prescriptions sent in for Dr Susa Simmonds who is covering for Dr Caron Presume. Controlled substance prescriptions were not going through appropriately under a resident's DEA number.

## 2021-03-15 NOTE — Telephone Encounter (Signed)
Received call from patient stating that he was unable to fill his refills today due to an issue with provider DEA#.  I called [facility] and spoke with Mickel Baas who informed me that they are starting to have issues with the DEA#s for providers who require a supervising signature.  She said that it is new and sometimes she has been able to manually enter the providers but it wouldn't allow her to enter either [facility] or [facility].  Dr. McDiarmid was precepting for [facility] today and agreed to resend the scripts in.  Update Mickel Baas and patient that this has been done.  Milanni Ayub,CMA

## 2021-04-07 DIAGNOSIS — U071 COVID-19: Secondary | ICD-10-CM | POA: Diagnosis not present

## 2021-04-07 DIAGNOSIS — W57XXXA Bitten or stung by nonvenomous insect and other nonvenomous arthropods, initial encounter: Secondary | ICD-10-CM | POA: Diagnosis not present

## 2021-04-19 ENCOUNTER — Telehealth: Payer: Self-pay

## 2021-04-19 ENCOUNTER — Other Ambulatory Visit: Payer: Self-pay

## 2021-04-19 ENCOUNTER — Encounter: Payer: Self-pay | Admitting: Family Medicine

## 2021-04-19 ENCOUNTER — Telehealth (INDEPENDENT_AMBULATORY_CARE_PROVIDER_SITE_OTHER): Payer: BC Managed Care – PPO | Admitting: Family Medicine

## 2021-04-19 ENCOUNTER — Ambulatory Visit (INDEPENDENT_AMBULATORY_CARE_PROVIDER_SITE_OTHER): Payer: BC Managed Care – PPO | Admitting: Family Medicine

## 2021-04-19 VITALS — BP 116/80 | HR 82 | Temp 98.3°F | Ht 70.0 in | Wt 195.4 lb

## 2021-04-19 DIAGNOSIS — G4483 Primary cough headache: Secondary | ICD-10-CM | POA: Diagnosis not present

## 2021-04-19 DIAGNOSIS — R058 Other specified cough: Secondary | ICD-10-CM

## 2021-04-19 DIAGNOSIS — N400 Enlarged prostate without lower urinary tract symptoms: Secondary | ICD-10-CM

## 2021-04-19 MED ORDER — KETOROLAC TROMETHAMINE 60 MG/2ML IM SOLN
30.0000 mg | Freq: Once | INTRAMUSCULAR | Status: AC
  Administered 2021-04-19: 30 mg via INTRAMUSCULAR

## 2021-04-19 MED ORDER — BENZONATATE 200 MG PO CAPS
200.0000 mg | ORAL_CAPSULE | Freq: Two times a day (BID) | ORAL | 0 refills | Status: DC | PRN
Start: 2021-04-19 — End: 2021-07-27

## 2021-04-19 MED ORDER — KETOROLAC TROMETHAMINE 60 MG/2ML IM SOLN: 60.0000 mg | Freq: Once | INTRAMUSCULAR | Status: DC

## 2021-04-19 NOTE — Progress Notes (Signed)
    SUBJECTIVE:   CHIEF COMPLAINT / HPI:   Jason Williams is a 60 yo M who presents for the following:  Cough Excessive coughing with associated neck and chest pain. It is a dry cough. Recent COVID infection 5/13. Prior to visit did drink warm water and had some improvement. He admits to trying to suppress it.   Headache Feels like he is a fog. Mostly concentrated at posterior neck. Usually resolved with ibuprofen. Has not taken ibuprofen due to not wanting to take this medication because he had COVID. He denies changes in vision, loss of sensation, neck stiffness, or chest pain.   Decreased urine output Mentioned at end of visit. Does not feel like he is fully emptying at night. Was just made aware that sister has narrowing at level of ureters and was told this could run in the family. He is worried but this he is noticing this now due to current state to having COVID and sister's condition. Denies other urinary symptoms such as fever, back pain, urine odor, dysuria.   PERTINENT  PMH / PSH: HTN, Anxiety   OBJECTIVE:   BP 116/80   Pulse 82   Temp 98.3 F (36.8 C) (Axillary)   Ht 5\' 10"  (1.778 m)   Wt 195 lb 6.4 oz (88.6 kg)   SpO2 98%   BMI 28.04 kg/m   General: Appears well, no acute distress. Age appropriate. Cardiac: RRR, normal heart sounds, no murmurs Respiratory: CTAB, no rales, rhonchi, or wheezing normal effort Neuro: alert and oriented x4, CN II-XII grossly intact, endorses symmetric sensation.  Psych: normal affect, anxious  ASSESSMENT/PLAN:   1. Post-viral cough syndrome Video visit with Dr. Owens Shark this morning. Given tessalon and suggested other supportive care measures. Concern for superimposed pneumonia is low given lack of symptoms and reassuring lung exam as well as vitals. Discussed this is likely self limiting and will take time to improve.  -Start tessalon -Continue warm water and honey -F/u if new symptom arises or cough worsens  2. Primary cough  headache Video visit with Dr. Owens Shark this morning as above. R/o neurological symptoms or possible SAH. Suspicion very low at this time with normal neurological exam. Toradol IM x1 can continue OTC ibuprofen PRN for headache. Reviewed red flags and when to seek emergency care.  - ketorolac (TORADOL) injection 30 mg - ibuprofen PRN for continued headache - F/u if symptoms continue or fail to resolve. Red flags discussed; patient voiced understanding.   3. Benign prostatic hyperplasia, unspecified whether lower urinary tract symptoms present Mentioned at the end of visit that patient is having feeling of not fully emptying his bladder. He is not taking any medication for this. Explored work up option with him such as urinalysis for UTI and repeat PSA (last 2020 was 2.3). He did not want to have these done today. He is most concerned that COVID has brought about his urinary symptoms and the concern of his sister have a restricted ureter. Discussed scheduling follow up with PCP for this issue when he is ready or gets over acute illness phase. -F/u w/ PCP at patient's discretion -Suggest UTI, repeat BPH, need for medication if appropriate    Gerlene Fee, [place]

## 2021-04-19 NOTE — Patient Instructions (Addendum)
Today we talked about:  Taking ibuprofen for headache. If this does not improve please call us back and we can send in a different medication to help.  Continue supportive measures for cough. Cough will continue to linger long after a virus. Start cough medicine given by Dr. Owens Shark.   Please call the clinic at [phone] if your symptoms worsen (look out for the red flags discussed today) or you have any concerns. It was our pleasure to serve you.  Dr. Janus Molder

## 2021-04-19 NOTE — Progress Notes (Signed)
[facility] Tele[facility] Visit  Patient consented to have virtual visit and was identified by name and date of birth. Method of visit: Video  Encounter participants: Patient: Jason Williams - located at home Provider: Martyn Malay - located at home Others (if applicable): none   Chief Complaint: headache   HPI:  [name] is a pleasant 60 year old gentleman with history of hypertension, hyperlipidemia, and OSA presenting with cough and headache.   The patient was diagnosed with COVID on 5/13. He had typical symptoms of cough, congestion. Lingering symptoms he describes include 'brain fog', cough, continued feelings of hot/cold, and headache. His cough is bothersome, relatively dry, and lasting at nighttime. He has been using Tylenol and Nyquil with minimal relief last night. This morning, woke up with 'one of the worst headaches' with photophobia. Has had headaches in the past but not recently. Describes this one as one of the worst headaches he has had.  Bilateral in nature, very severe. No associated nausea, vomiting, weakness. Feels he continues to have brain fog. Denies vision changes, chest pain, dyspnea.  He has been avoiding NSAIDs as he read this was not safe to take if one had COVID. He typically takes Advil for headaches.    ROS: per HPI  Pertinent PMHx:  HTN HLD OSA  Exam:  Tired appearing, dry cough No distress Speaking in full sentences  Has eyes closed at start of encounter then opens appropriately, sensitive to light Normal speech Normal rate of breathing   Assessment/Plan:  Headache, likely related to coughing, but given history of hypertension and severity, recommend in person assessment and complete neurological examination. Lower suspicion for [facility], but could be considered pending neurological examination.  Recommended Tylenol, pending exam and BP at visit, safe for NSAIDS (he has been avoiding due to reading about COVID +  NSAIDS).   Post COVID Cough discussed recommended tea with honey. Discussed alternatives, discussed lack of efficacy of Tessalon. Shared decision making, prescribed these, discussed risks with excess use. Recommend symptomatic management, consider chest imaging pending exam. Could have superimposed bacterial pneumonia but less likely given current symptoms.  Scheduled for PM visit, no charge for this AM virtual visit.   Time spent during visit with patient: 7 minutes

## 2021-04-19 NOTE — Telephone Encounter (Signed)
Patient calls nurse line regarding positive COVID results from 5/13. Patient reports persistent cough and headache. Patient reports trying OTC mucinex, Nyquil, and Alka seltzer plus cough and flu.   Patient is requesting cough medication at this time. Scheduled patient virtual visit with Dr. Owens Shark for this morning to discuss further.   Talbot Grumbling, RN

## 2021-04-19 NOTE — Assessment & Plan Note (Signed)
Mentioned at the end of visit that patient is having feeling of not fully emptying his bladder. He is not taking any medication for this. Explored work up option with him such as urinalysis for UTI and repeat PSA (last 2020 was 2.3). He did not want to have these done today. He is most concerned that COVID has brought about his urinary symptoms and the concern of his sister have a restricted ureter. Discussed scheduling follow up with PCP for this issue when he is ready or gets over acute illness phase. -F/u w/ PCP at patient's discretion -Suggest UTI, repeat BPH, need for medication if appropriate

## 2021-05-10 ENCOUNTER — Telehealth: Payer: Self-pay | Admitting: Family Medicine

## 2021-05-10 NOTE — Telephone Encounter (Signed)
Anice Paganini is wondering if you would accept her boyfriend as your patient

## 2021-05-10 NOTE — Telephone Encounter (Signed)
Okay to accept.

## 2021-05-12 ENCOUNTER — Other Ambulatory Visit: Payer: Self-pay | Admitting: Family Medicine

## 2021-05-17 NOTE — Telephone Encounter (Signed)
Lvm to schedule appt.

## 2021-06-09 ENCOUNTER — Other Ambulatory Visit: Payer: Self-pay

## 2021-06-09 DIAGNOSIS — G4700 Insomnia, unspecified: Secondary | ICD-10-CM

## 2021-06-09 MED ORDER — TRAMADOL HCL 50 MG PO TABS: 50.0000 mg | ORAL_TABLET | Freq: Three times a day (TID) | ORAL | 0 refills | Status: DC | PRN

## 2021-06-09 MED ORDER — ZOLPIDEM TARTRATE 10 MG PO TABS
ORAL_TABLET | ORAL | 0 refills | Status: DC
Start: 2021-06-09 — End: 2021-07-12

## 2021-06-09 MED ORDER — DIAZEPAM 10 MG PO TABS: ORAL_TABLET | ORAL | 0 refills | Status: DC

## 2021-06-13 ENCOUNTER — Other Ambulatory Visit: Payer: Self-pay

## 2021-06-13 MED ORDER — TADALAFIL 20 MG PO TABS: 20.0000 mg | ORAL_TABLET | ORAL | 0 refills | Status: DC | PRN

## 2021-07-12 ENCOUNTER — Encounter: Payer: Self-pay | Admitting: Family Medicine

## 2021-07-12 ENCOUNTER — Other Ambulatory Visit: Payer: Self-pay | Admitting: Family Medicine

## 2021-07-12 ENCOUNTER — Other Ambulatory Visit: Payer: Self-pay

## 2021-07-12 ENCOUNTER — Ambulatory Visit (INDEPENDENT_AMBULATORY_CARE_PROVIDER_SITE_OTHER): Payer: BC Managed Care – PPO | Admitting: Family Medicine

## 2021-07-12 VITALS — BP 138/90 | HR 82 | Temp 98.5°F | Resp 18 | Ht 70.0 in | Wt 207.8 lb

## 2021-07-12 DIAGNOSIS — M1711 Unilateral primary osteoarthritis, right knee: Secondary | ICD-10-CM

## 2021-07-12 DIAGNOSIS — G4700 Insomnia, unspecified: Secondary | ICD-10-CM

## 2021-07-12 DIAGNOSIS — F419 Anxiety disorder, unspecified: Secondary | ICD-10-CM

## 2021-07-12 DIAGNOSIS — M722 Plantar fascial fibromatosis: Secondary | ICD-10-CM

## 2021-07-12 DIAGNOSIS — E785 Hyperlipidemia, unspecified: Secondary | ICD-10-CM

## 2021-07-12 DIAGNOSIS — N529 Male erectile dysfunction, unspecified: Secondary | ICD-10-CM | POA: Diagnosis not present

## 2021-07-12 DIAGNOSIS — F411 Generalized anxiety disorder: Secondary | ICD-10-CM

## 2021-07-12 DIAGNOSIS — Z789 Other specified health status: Secondary | ICD-10-CM | POA: Diagnosis not present

## 2021-07-12 DIAGNOSIS — I10 Essential (primary) hypertension: Secondary | ICD-10-CM

## 2021-07-12 MED ORDER — DIAZEPAM 10 MG PO TABS: ORAL_TABLET | ORAL | 0 refills | Status: DC

## 2021-07-12 MED ORDER — TADALAFIL 20 MG PO TABS: 20.0000 mg | ORAL_TABLET | ORAL | 0 refills | Status: DC | PRN

## 2021-07-12 MED ORDER — ZOLPIDEM TARTRATE 10 MG PO TABS
ORAL_TABLET | ORAL | 0 refills | Status: DC
Start: 2021-07-12 — End: 2021-08-12

## 2021-07-12 MED ORDER — TRAMADOL HCL 50 MG PO TABS: 50.0000 mg | ORAL_TABLET | Freq: Three times a day (TID) | ORAL | 0 refills | Status: DC | PRN

## 2021-07-12 NOTE — Assessment & Plan Note (Signed)
Well controlled, no changes to meds. Encouraged heart healthy diet such as the DASH diet and exercise as tolerated.  Slightly high today but pt states it is normally 120/80 Nurse visit in 2 weeks to check bp

## 2021-07-12 NOTE — Assessment & Plan Note (Signed)
Consider ortho if pain does not improve Pt did not want to go now

## 2021-07-12 NOTE — Patient Instructions (Signed)
https://www.nhlbi.nih.gov/files/docs/public/heart/dash_brief.pdf">  DASH Eating Plan DASH stands for Dietary Approaches to Stop Hypertension. The DASH eating plan is a healthy eating plan that has been shown to: Reduce high blood pressure (hypertension). Reduce your risk for type 2 diabetes, heart disease, and stroke. Help with weight loss. What are tips for following this plan? Reading food labels Check food labels for the amount of salt (sodium) per serving. Choose foods with less than 5 percent of the Daily Value of sodium. Generally, foods with less than 300 milligrams (mg) of sodium per serving fit into this eating plan. To find whole grains, look for the word "whole" as the first word in the ingredient list. Shopping Buy products labeled as "low-sodium" or "no salt added." Buy fresh foods. Avoid canned foods and pre-made or frozen meals. Cooking Avoid adding salt when cooking. Use salt-free seasonings or herbs instead of table salt or sea salt. Check with your health care provider or pharmacist before using salt substitutes. Do not fry foods. Cook foods using healthy methods such as baking, boiling, grilling, roasting, and broiling instead. Cook with heart-healthy oils, such as olive, canola, avocado, soybean, or sunflower oil. Meal planning  Eat a balanced diet that includes: 4 or more servings of fruits and 4 or more servings of vegetables each day. Try to fill one-half of your plate with fruits and vegetables. 6-8 servings of whole grains each day. Less than 6 oz (170 g) of lean meat, poultry, or fish each day. A 3-oz (85-g) serving of meat is about the same size as a deck of cards. One egg equals 1 oz (28 g). 2-3 servings of low-fat dairy each day. One serving is 1 cup (237 mL). 1 serving of nuts, seeds, or beans 5 times each week. 2-3 servings of heart-healthy fats. Healthy fats called omega-3 fatty acids are found in foods such as walnuts, flaxseeds, fortified milks, and eggs.  These fats are also found in cold-water fish, such as sardines, salmon, and mackerel. Limit how much you eat of: Canned or prepackaged foods. Food that is high in trans fat, such as some fried foods. Food that is high in saturated fat, such as fatty meat. Desserts and other sweets, sugary drinks, and other foods with added sugar. Full-fat dairy products. Do not salt foods before eating. Do not eat more than 4 egg yolks a week. Try to eat at least 2 vegetarian meals a week. Eat more home-cooked food and less restaurant, buffet, and fast food.  Lifestyle When eating at a restaurant, ask that your food be prepared with less salt or no salt, if possible. If you drink alcohol: Limit how much you use to: 0-1 drink a day for women who are not pregnant. 0-2 drinks a day for men. Be aware of how much alcohol is in your drink. In the [place] one drink equals one 12 oz bottle of beer (355 mL), one 5 oz glass of wine (148 mL), or one 1 oz glass of hard liquor (44 mL). General information Avoid eating more than 2,300 mg of salt a day. If you have hypertension, you may need to reduce your sodium intake to 1,500 mg a day. Work with your health care provider to maintain a healthy body weight or to lose weight. Ask what an ideal weight is for you. Get at least 30 minutes of exercise that causes your heart to beat faster (aerobic exercise) most days of the week. Activities may include walking, swimming, or biking. Work with your health care provider   or dietitian to adjust your eating plan to your individual calorie needs. What foods should I eat? Fruits All fresh, dried, or frozen fruit. Canned fruit in natural juice (without addedsugar). Vegetables Fresh or frozen vegetables (raw, steamed, roasted, or grilled). Low-sodium or reduced-sodium tomato and vegetable juice. Low-sodium or reduced-sodium tomatosauce and tomato paste. Low-sodium or reduced-sodium canned vegetables. Grains Whole-grain or  whole-wheat bread. Whole-grain or whole-wheat pasta. Brown rice. Oatmeal. Quinoa. Bulgur. Whole-grain and low-sodium cereals. Pita bread.Low-fat, low-sodium crackers. Whole-wheat flour tortillas. Meats and other proteins Skinless chicken or turkey. Ground chicken or turkey. Pork with fat trimmed off. Fish and seafood. Egg whites. Dried beans, peas, or lentils. Unsalted nuts, nut butters, and seeds. Unsalted canned beans. Lean cuts of beef with fat trimmed off. Low-sodium, lean precooked or cured meat, such as sausages or meatloaves. Dairy Low-fat (1%) or fat-free (skim) milk. Reduced-fat, low-fat, or fat-free cheeses. Nonfat, low-sodium ricotta or cottage cheese. Low-fat or nonfatyogurt. Low-fat, low-sodium cheese. Fats and oils Soft margarine without trans fats. Vegetable oil. Reduced-fat, low-fat, or light mayonnaise and salad dressings (reduced-sodium). Canola, safflower, olive, avocado, soybean, andsunflower oils. Avocado. Seasonings and condiments Herbs. Spices. Seasoning mixes without salt. Other foods Unsalted popcorn and pretzels. Fat-free sweets. The items listed above may not be a complete list of foods and beverages you can eat. Contact a dietitian for more information. What foods should I avoid? Fruits Canned fruit in a light or heavy syrup. Fried fruit. Fruit in cream or buttersauce. Vegetables Creamed or fried vegetables. Vegetables in a cheese sauce. Regular canned vegetables (not low-sodium or reduced-sodium). Regular canned tomato sauce and paste (not low-sodium or reduced-sodium). Regular tomato and vegetable juice(not low-sodium or reduced-sodium). Pickles. Olives. Grains Baked goods made with fat, such as croissants, muffins, or some breads. Drypasta or rice meal packs. Meats and other proteins Fatty cuts of meat. Ribs. Fried meat. Bacon. Bologna, salami, and other precooked or cured meats, such as sausages or meat loaves. Fat from the back of a pig (fatback). Bratwurst.  Salted nuts and seeds. Canned beans with added salt. Canned orsmoked fish. Whole eggs or egg yolks. Chicken or turkey with skin. Dairy Whole or 2% milk, cream, and half-and-half. Whole or full-fat cream cheese. Whole-fat or sweetened yogurt. Full-fat cheese. Nondairy creamers. Whippedtoppings. Processed cheese and cheese spreads. Fats and oils Butter. Stick margarine. Lard. Shortening. Ghee. Bacon fat. Tropical oils, suchas coconut, palm kernel, or palm oil. Seasonings and condiments Onion salt, garlic salt, seasoned salt, table salt, and sea salt. Worcestershire sauce. Tartar sauce. Barbecue sauce. Teriyaki sauce. Soy sauce, including reduced-sodium. Steak sauce. Canned and packaged gravies. Fish sauce. Oyster sauce. Cocktail sauce. Store-bought horseradish. Ketchup. Mustard. Meat flavorings and tenderizers. Bouillon cubes. Hot sauces. Pre-made or packaged marinades. Pre-made or packaged taco seasonings. Relishes. Regular saladdressings. Other foods Salted popcorn and pretzels. The items listed above may not be a complete list of foods and beverages you should avoid. Contact a dietitian for more information. Where to find more information National Heart, Lung, and Blood Institute: www.nhlbi.nih.gov American Heart Association: www.heart.org Academy of Nutrition and Dietetics: www.eatright.org National Kidney Foundation: www.kidney.org Summary The DASH eating plan is a healthy eating plan that has been shown to reduce high blood pressure (hypertension). It may also reduce your risk for type 2 diabetes, heart disease, and stroke. When on the DASH eating plan, aim to eat more fresh fruits and vegetables, whole grains, lean proteins, low-fat dairy, and heart-healthy fats. With the DASH eating plan, you should limit salt (sodium) intake to 2,300   mg a day. If you have hypertension, you may need to reduce your sodium intake to 1,500 mg a day. Work with your health care provider or dietitian to adjust  your eating plan to your individual calorie needs. This information is not intended to replace advice given to you by your health care provider. Make sure you discuss any questions you have with your healthcare provider. Document Revised: 10/17/2019 Document Reviewed: 10/17/2019 Elsevier Patient Education  2022 Elsevier Inc.  

## 2021-07-12 NOTE — Assessment & Plan Note (Signed)
Refill cialis

## 2021-07-12 NOTE — Progress Notes (Signed)
New Patient Office Visit  Subjective:  Patient ID: Jason Williams, male    DOB: 01/20/1961  Age: 60 y.o. MRN: [id number]  CC:  Chief Complaint  Patient presents with   New Patient (Initial Visit)    HPI Jason Williams presents to establish and to discuss his cholesterol and ed.  He would also like to get a refill on his tramadol and ambien.    Pt states he has been on all the statins and is unable to tolerate any. He states he discussed his issues with a friend who is a cardiologist and he recommended repatha.  Pt does have a family history of heart disease.   He also has chronic knee pain --- he has seen sport med and has had injections with no relief--- he does not want to see ortho at this time  He also c/o some problems with urination and is concerned because his sister had a urethral stricture and was told it was genetic but he does not want to do anything about that today.   Pt is also on valium for anxiety--- he has been on this for years.  I mentioned concern about tramadol, ambien and valium on his med list.  He stated no one else had a problem with it.   Past Medical History:  Diagnosis Date   Anxiety    Arthritis    Depression    GERD (gastroesophageal reflux disease)    Heart murmur    Hyperlipidemia    Hypertension    Sleep apnea     Past Surgical History:  Procedure Laterality Date   ankle re construction  2011   bilateral   APPENDECTOMY  1981    Family History  Problem Relation Age of Onset   Esophageal cancer Mother    Heart attack Father        Died of MI at 26   Mental illness Father    Healthy Sister    Healthy Sister    Heart disease Maternal Grandfather        MI/CABG   Heart attack Maternal Grandfather    Colon cancer Neg Hx    Rectal cancer Neg Hx    Stomach cancer Neg Hx     Social History   Socioeconomic History   Marital status: Single    Spouse name: Not on file   Number of children: 1   Years of education: Not on file    Highest education level: Not on file  Occupational History   Occupation: Lobbyist: [employer]  Tobacco Use   Smoking status: Never   Smokeless tobacco: Never  Vaping Use   Vaping Use: Never used  Substance and Sexual Activity   Alcohol use: Yes    Alcohol/week: 2.0 standard drinks    Types: 2 Glasses of wine per week   Drug use: No   Sexual activity: Yes    Birth control/protection: Pill, None    Comment: 1 male  Other Topics Concern   Not on file  Social History Narrative   Not on file   Social Determinants of Health   Financial Resource Strain: Not on file  Food Insecurity: Not on file  Transportation Needs: Not on file  Physical Activity: Not on file  Stress: Not on file  Social Connections: Not on file  Intimate Partner Violence: Not on file    ROS Review of Systems  Constitutional: Negative.   HENT:  Negative for congestion, ear  pain, hearing loss, nosebleeds, postnasal drip, rhinorrhea, sinus pressure, sneezing and tinnitus.   Eyes:  Negative for photophobia, discharge, itching and visual disturbance.  Respiratory: Negative.    Cardiovascular: Negative.   Gastrointestinal:  Negative for abdominal distention, abdominal pain, anal bleeding, blood in stool and constipation.  Endocrine: Negative.   Genitourinary: Negative.   Musculoskeletal: Negative.   Skin: Negative.   Allergic/Immunologic: Negative.   Neurological:  Negative for dizziness, weakness, light-headedness, numbness and headaches.  Psychiatric/Behavioral:  Negative for agitation, confusion, decreased concentration, dysphoric mood, sleep disturbance and suicidal ideas. The patient is not nervous/anxious.    Objective:   Today's Vitals: BP 138/90   Pulse 82   Temp 98.5 F (36.9 C) (Oral)   Resp 18   Ht '5\' 10"'$  (1.778 m)   Wt 207 lb 12.8 oz (94.3 kg)   SpO2 96%   BMI 29.82 kg/m   Physical Exam Vitals and nursing note reviewed.  Constitutional:      Appearance: He is  well-developed.  HENT:     Head: Normocephalic and atraumatic.  Eyes:     Pupils: Pupils are equal, round, and reactive to light.  Neck:     Thyroid: No thyromegaly.  Cardiovascular:     Rate and Rhythm: Normal rate and regular rhythm.     Heart sounds: No murmur heard. Pulmonary:     Effort: Pulmonary effort is normal. No respiratory distress.     Breath sounds: Normal breath sounds. No wheezing or rales.  Chest:     Chest wall: No tenderness.  Musculoskeletal:        General: No tenderness.     Cervical back: Normal range of motion and neck supple.  Skin:    General: Skin is warm and dry.  Neurological:     Mental Status: He is alert and oriented to person, place, and time.  Psychiatric:        Behavior: Behavior normal.        Thought Content: Thought content normal.        Judgment: Judgment normal.   Assessment & Plan:   Problem List Items Addressed This Visit       Unprioritized   Anxiety state    Pt has been on diazepam for years Discussed concern over him being on ambien, valium and tramadol       ED    Refill cialis       Relevant Medications   tadalafil (CIALIS) 20 MG tablet   Hyperlipidemia - Primary    Pt with family history cad and is intolerant of statins Refer to lipid clinic       Relevant Medications   tadalafil (CIALIS) 20 MG tablet   Other Relevant Orders   [facility]B Referral [facility]   Insomnia    Database reviewed uds utd Will recheck uds / contract at next visit      Relevant Medications   zolpidem (AMBIEN) 10 MG tablet   PLANTAR FASCIITIS, BILATERAL    Pt on tramadol prn       Primary hypertension    Well controlled, no changes to meds. Encouraged heart healthy diet such as the DASH diet and exercise as tolerated.  Slightly high today but pt states it is normally 120/80 Nurse visit in 2 weeks to check bp       Relevant Medications   tadalafil (CIALIS) 20 MG tablet   Primary osteoarthritis of right  knee    Consider ortho if pain does  not improve Pt did not want to go now      Relevant Medications   traMADol (ULTRAM) 50 MG tablet   Statin intolerance   Relevant Orders   AMB Referral to [facility]    Outpatient Encounter Medications as of 07/12/2021  Medication Sig   amLODipine (NORVASC) 10 MG tablet Take 1 tablet (10 mg total) by mouth daily.   aspirin 81 MG chewable tablet TAKE 1 TABLET BY MOUTH DAILY TO THIN BLOOD TO HELP TO PREVENT HEART ATTACK AND STROKE   diazepam (VALIUM) 10 MG tablet TAKE 1 TABLET BY MOUTH THREE TIMES A DAY   [DISCONTINUED] rosuvastatin (CRESTOR) 10 MG tablet Take 1 tablet (10 mg total) by mouth daily.   [DISCONTINUED] tadalafil (CIALIS) 20 MG tablet Take 1 tablet (20 mg total) by mouth as needed.   [DISCONTINUED] traMADol (ULTRAM) 50 MG tablet Take 1 tablet (50 mg total) by mouth 3 (three) times daily as needed for moderate pain or severe pain.   [DISCONTINUED] zolpidem (AMBIEN) 10 MG tablet TAKE 1 TABLET BY MOUTH EVERY DAY AT BEDTIME AS NEEDED FOR SLEEP   benzonatate (TESSALON) 200 MG capsule Take 1 capsule (200 mg total) by mouth 2 (two) times daily as needed for cough. (Patient not taking: Reported on 07/12/2021)   tadalafil (CIALIS) 20 MG tablet Take 1 tablet (20 mg total) by mouth as needed.   traMADol (ULTRAM) 50 MG tablet Take 1 tablet (50 mg total) by mouth 3 (three) times daily as needed for moderate pain or severe pain.   zolpidem (AMBIEN) 10 MG tablet TAKE 1 TABLET BY MOUTH EVERY DAY AT BEDTIME AS NEEDED FOR SLEEP   No facility-administered encounter medications on file as of 07/12/2021.    Follow-up: Return in about 2 weeks (around 07/26/2021), or if symptoms worsen or fail to improve, for htn=nurse visit and schedule cpe.   [name], DO

## 2021-07-12 NOTE — Assessment & Plan Note (Signed)
Pt with family history cad and is intolerant of statins Refer to lipid clinic

## 2021-07-12 NOTE — Assessment & Plan Note (Signed)
Pt has been on diazepam for years Discussed concern over him being on ambien, valium and tramadol

## 2021-07-12 NOTE — Assessment & Plan Note (Signed)
Database reviewed uds utd Will recheck uds / contract at next visit

## 2021-07-12 NOTE — Assessment & Plan Note (Signed)
Pt on tramadol prn

## 2021-07-27 ENCOUNTER — Telehealth: Payer: Self-pay | Admitting: *Deleted

## 2021-07-27 ENCOUNTER — Other Ambulatory Visit: Payer: Self-pay

## 2021-07-27 ENCOUNTER — Other Ambulatory Visit (HOSPITAL_BASED_OUTPATIENT_CLINIC_OR_DEPARTMENT_OTHER): Payer: Self-pay | Admitting: Internal Medicine

## 2021-07-27 ENCOUNTER — Encounter (HOSPITAL_BASED_OUTPATIENT_CLINIC_OR_DEPARTMENT_OTHER): Payer: Self-pay | Admitting: Internal Medicine

## 2021-07-27 ENCOUNTER — Ambulatory Visit (INDEPENDENT_AMBULATORY_CARE_PROVIDER_SITE_OTHER): Payer: BC Managed Care – PPO | Admitting: Internal Medicine

## 2021-07-27 VITALS — BP 124/79 | HR 71

## 2021-07-27 VITALS — BP 132/80 | HR 79 | Ht 70.0 in | Wt 205.0 lb

## 2021-07-27 DIAGNOSIS — T466X5A Adverse effect of antihyperlipidemic and antiarteriosclerotic drugs, initial encounter: Secondary | ICD-10-CM

## 2021-07-27 DIAGNOSIS — M791 Myalgia, unspecified site: Secondary | ICD-10-CM

## 2021-07-27 DIAGNOSIS — R072 Precordial pain: Secondary | ICD-10-CM

## 2021-07-27 DIAGNOSIS — I10 Essential (primary) hypertension: Secondary | ICD-10-CM

## 2021-07-27 DIAGNOSIS — E785 Hyperlipidemia, unspecified: Secondary | ICD-10-CM | POA: Diagnosis not present

## 2021-07-27 DIAGNOSIS — Z79899 Other long term (current) drug therapy: Secondary | ICD-10-CM | POA: Diagnosis not present

## 2021-07-27 DIAGNOSIS — Z8249 Family history of ischemic heart disease and other diseases of the circulatory system: Secondary | ICD-10-CM | POA: Diagnosis not present

## 2021-07-27 MED ORDER — REPATHA SURECLICK 140 MG/ML ~~LOC~~ SOAJ: 1.0000 | SUBCUTANEOUS | 3 refills | Status: DC

## 2021-07-27 MED ORDER — METOPROLOL TARTRATE 100 MG PO TABS: 100.0000 mg | ORAL_TABLET | Freq: Once | ORAL | 0 refills | Status: DC

## 2021-07-27 NOTE — Progress Notes (Signed)
Pt here for Blood pressure check per Dr. Carollee Herter   Pt currently takes: amlodipine '10mg'$   Has been checking at home and has been high.  Patient does not have log of blood pressures today.  BP Readings from Last 3 Encounters:  07/12/21 138/90  04/19/21 116/80  02/14/21 (!) 152/100     Pt reports compliance with medication.  BP today @ = 124/79 HR = 71  Pt advised per Dr. Larose Kells to continue amlodipine '10mg'$  and keep next appointment with Dr. Carollee Herter in January. Also advise patient to keep log of blood pressures and bring to next appointment.   Kathlene November, MD

## 2021-07-27 NOTE — Patient Instructions (Addendum)
Medication Instructions:  Dr. Debara Pickett recommends Repatha '140mg'$ /mL or Praluent '150mg'$ /mL (PCSK9). This is an injectable cholesterol medication self-administered once every 14 days. This medication will likely need prior approval with your insurance company, which we will work on. If the medication is not approved initially, we may need to do an appeal with your insurance.   Administer medication in area of fatty tissue such as abdomen, outer thigh, back of upper arm - and rotate site with each injection Store medication in refrigerator until ready to administer - allow to sit at room temp for 30 mins - 1 hour prior to injection Dispose of medication in a SHARPS container - your pharmacy should be able to direct you on this and proper disposal   If you need a co-pay card for Repatha: [email or web address]-[email or web address] >> paying for Repatha or red box that says "[place]" in top right If you need a co-pay card for Praluent: WedMap.it >> starting & paying for Praluent  Patient Assistance:   The PAN Foundation: https://www.panfoundation.org/disease-funds/hypercholesterolemia/ -- can sign up for wait list  The Health Well foundation offers assistance to help pay for medication copays.  They will cover copays for all cholesterol lowering meds, including statins, fibrates, omega-3 fish oils like Vascepa, ezetimibe, Repatha, Praluent, Nexletol, Nexlizet.  The cards are usually good for $2,500 or 12 months, whichever comes first. Go to healthwellfoundation.org Click on "Apply Now" Answer questions as to whom is applying (patient or representative) Your disease fund will be "hypercholesterolemia - Medicare access" They will ask questions about finances and which medications you are taking for cholesterol When you submit, the approval is usually within minutes.  You will need to print the card information from the site You will need to show this information to your pharmacy, they will bill your Medicare Part D plan  first -then bill Health Well --for the copay.   You can also call them at [phone], although the hold times can be quite long.     *If you need a refill on your cardiac medications before your next appointment, please call your pharmacy*   Lab Work: BMET, LP(a) -- complete about 1 week before CT test  LP(a), NMR lipoprofile - complete about 1 week before your next visit with Dr. Debara Pickett   If you have labs (blood work) drawn today and your tests are completely normal, you will receive your results only by: [place] (if you have MyChart) OR A paper copy in the mail If you have any lab test that is abnormal or we need to change your treatment, we will call you to review the results.   Testing/Procedures: Cardiac CT at [facility] will get a call to arrange an appointment once approved w/insurance   Follow-Up: At [facility], you and your health needs are our priority.  As part of our continuing mission to provide you with exceptional heart care, we have created designated Provider Care Teams.  These Care Teams include your primary Cardiologist (physician) and Advanced Practice Providers (APPs -  Physician Assistants and Nurse Practitioners) who all work together to provide you with the care you need, when you need it.  We recommend signing up for the patient portal called "MyChart".  Sign up information is provided on this After Visit Summary.  MyChart is used to connect with patients for Virtual Visits (Telemedicine).  Patients are able to view lab/test results, encounter notes, upcoming appointments, etc.  Non-urgent messages can be sent to your provider as well.  To learn more about what you can do with MyChart, go to [email or web address].    Your next appointment:   4 month(s) - lipid clinic  The format for your next appointment:   In Person  Provider:   Raliegh Ip Mali Hilty, MD   Other Instructions    Your cardiac CT will be scheduled at one of the below  locations:   [facility] [address] [place], [place] [address] [phone]  [place] [address] [place], [place] [address] [phone]  If scheduled at [facility], please arrive at the [facility] main entrance (entrance A) of [facility] 30 minutes prior to test start time. Proceed to the [facility] Radiology Department (first floor) to check-in and test prep.  If scheduled at [facility], please arrive 15 mins early for check-in and test prep.  Please follow these instructions carefully (unless otherwise directed):  Hold all erectile dysfunction medications at least 3 days (72 hrs) prior to test.  On the Night Before the Test: Be sure to Drink plenty of water. Do not consume any caffeinated/decaffeinated beverages or chocolate 12 hours prior to your test. Do not take any antihistamines 12 hours prior to your test.  On the Day of the Test: Drink plenty of water until 1 hour prior to the test. Do not eat any food 4 hours prior to the test. You may take your regular medications prior to the test.  Take metoprolol (Lopressor) two hours prior to test.  After the Test: Drink plenty of water. After receiving IV contrast, you may experience a mild flushed feeling. This is normal. On occasion, you may experience a mild rash up to 24 hours after the test. This is not dangerous. If this occurs, you can take Benadryl 25 mg and increase your fluid intake. If you experience trouble breathing, this can be serious. If it is severe call 911 IMMEDIATELY. If it is mild, please call our office. If you take any of these medications: Glipizide/Metformin, Avandament, Glucavance, please do not take 48 hours after completing test unless otherwise instructed.  Please allow 2-4 weeks for scheduling of routine cardiac CTs. Some insurance companies require a pre-authorization which may  delay scheduling of this test.   For non-scheduling related questions, please contact the cardiac imaging nurse navigator should you have any questions/concerns: Marchia Bond, Cardiac Imaging Nurse Navigator Gordy Clement, Cardiac Imaging Nurse Navigator [place] [facility] Direct Office Dial: [phone]   For scheduling needs, including cancellations and rescheduling, please call [place], [phone].

## 2021-07-27 NOTE — Progress Notes (Signed)
LIPID CLINIC CONSULT NOTE  Chief Complaint:  Manage dyslipidemia  Primary Care Physician: Carollee Herter, Alferd Apa, DO  Primary Cardiologist:  None  HPI:  Jason Williams is a 60 y.o. male who is being seen today for the evaluation of dyslipidemia at the request of [name], *.  This is a pleasant 60 year old male kindly referred by her evaluation and management of dyslipidemia.  Jason Williams has longstanding elevated cholesterol but unfortunately has been intolerant to statins causing significant side effects, primarily myalgias.  Recent labs showed total cholesterol 250, triglycerides 230, HDL 41 and LDL 166.  He had been seen remotely by heart care in 2015 in 2016 for chest pain but no clear cardiac cause was noted.  No in 2016 showed normal systolic function with mild left atrial enlargement.  He also underwent Myoview stress testing, which showed no reversible perfusion defects.  Other cardiovascular risk factors include anxiety/depression, hypertension and obstructive sleep apnea.  He does report significant early onset heart disease in his family including a father who died of heart attack in his 71s.  Jason Williams also reports recent episodes of chest discomfort.  This has if feels like a heaviness in his chest that is sometimes associated with exertion and relieved by rest but can occur at rest as well.  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  He said he had a recent EKG which showed no ischemic changes.  He is also had some other testing in the past as well.  PMHx:  Past Medical History:  Diagnosis Date   Anxiety    Arthritis    Depression    GERD (gastroesophageal reflux disease)    Heart murmur    Hyperlipidemia    Hypertension    Sleep apnea     Past Surgical History:  Procedure Laterality Date   ankle re construction  2011   bilateral   APPENDECTOMY  1981     FAMHx:  Family History  Problem Relation Age of Onset   Esophageal cancer Mother    Heart attack Father        Died of MI at 42   Mental illness Father    Healthy Sister    Healthy Sister    Heart disease Maternal Grandfather        MI/CABG   Heart attack Maternal Grandfather    Colon cancer Neg Hx    Rectal cancer Neg Hx    Stomach cancer Neg Hx     SOCHx:   reports that he has never smoked. He has never used smokeless tobacco. He reports current alcohol use of about 2.0 standard drinks per week. He reports that he does not use drugs.  ALLERGIES:  No Known Allergies  ROS: Pertinent items noted in HPI and remainder of comprehensive ROS otherwise negative.  HOME MEDS: Current Outpatient Medications on File Prior to Visit  Medication Sig Dispense Refill   amLODipine (NORVASC) 10 MG tablet Take 1 tablet (10 mg total) by mouth daily. 90 tablet 2   aspirin 81 MG chewable tablet TAKE 1 TABLET BY MOUTH DAILY TO THIN BLOOD TO HELP TO PREVENT HEART ATTACK AND STROKE     diazepam (VALIUM) 10 MG tablet TAKE 1 TABLET BY MOUTH THREE TIMES A DAY 90 tablet 0   tadalafil (CIALIS) 20 MG tablet Take 1 tablet (20 mg total) by mouth as needed. 30 tablet 0  traMADol (ULTRAM) 50 MG tablet Take 1 tablet (50 mg total) by mouth 3 (three) times daily as needed for moderate pain or severe pain. 90 tablet 0   zolpidem (AMBIEN) 10 MG tablet TAKE 1 TABLET BY MOUTH EVERY DAY AT BEDTIME AS NEEDED FOR SLEEP 90 tablet 0   No current facility-administered medications on file prior to visit.    LABS/IMAGING: No results found for this or any previous visit (from the past 48 hour(s)). No results found.  LIPID PANEL:    Component Value Date/Time   CHOL 250 (H) 10/12/2020 1502   TRIG 230 (H) 10/12/2020 1502   HDL 41 10/12/2020 1502   CHOLHDL 6.1 (H) 10/12/2020 1502   CHOLHDL 5.2 (H) 03/31/2016 1125   VLDL 20 03/31/2016 1125   LDLCALC 166 (H) 10/12/2020 1502    WEIGHTS: Wt Readings from Last 3  Encounters:  07/27/21 205 lb (93 kg)  07/12/21 207 lb 12.8 oz (94.3 kg)  04/19/21 195 lb 6.4 oz (88.6 kg)    VITALS: BP 132/80   Pulse 79   Ht '5\' 10"'$  (1.778 m)   Wt 205 lb (93 kg)   SpO2 98%   BMI 29.41 kg/m   EXAM: General appearance: alert and no distress Neck: no carotid bruit, no JVD, and thyroid not enlarged, symmetric, no tenderness/mass/nodules Lungs: clear to auscultation bilaterally Heart: regular rate and rhythm, S1, S2 normal, no murmur, click, rub or gallop Abdomen: soft, non-tender; bowel sounds normal; no masses,  no organomegaly Extremities: extremities normal, atraumatic, no cyanosis or edema Pulses: 2+ and symmetric Skin: Skin color, texture, turgor normal. No rashes or lesions Neurologic: Grossly normal Psych: Pleasant  EKG: N/A  ASSESSMENT: Chest pain  Mixed dyslipidemia Intolerance-myalgias Strong family history of premature coronary disease Hypertension  PLAN: 1.   [name] has recently had some chest pain although not always associated with exertion and relieved by rest.  He does have elevated cholesterol and has been statin intolerant.  There is family history of very early onset heart disease in his father.  This could suggest high cholesterol risk, possible familial hyperlipidemia perhaps he has an elevated LP(a).  We will plan on proceeding for cardiovascular work-up for chest pain.  Recommend CT coronary angiography.  I will contact him with those results.  Otherwise would recommend pursuing a PCSK9 inhibitor.  Pixie Casino, MD, [facility], [place] Director of the Advanced Lipid Disorders &  Cardiovascular Risk Reduction Clinic Diplomate of the American Board of Clinical Lipidology Attending Cardiologist  Direct Dial: [phone]  Fax: [phone]  Website:  [email or web address] 07/27/2021, 2:34 PM

## 2021-07-27 NOTE — Telephone Encounter (Signed)
You referred patient to cholesterol clinic and the earliest appointment that was given was 11/30/21.  They have put him on a cancellation list but patient would like his cholesterol issue to be addressed sooner.  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  Are you able to possibly prescribe this or does it have to be done by the lipid clinic?

## 2021-07-28 ENCOUNTER — Telehealth: Payer: Self-pay | Admitting: Internal Medicine

## 2021-07-28 NOTE — Telephone Encounter (Signed)
Patient was prescribed PCSK9i  The preferred med with his [employer] drug plan is PRALUENT  ID: [id number] BIN: [id number] PCN: [id number] RxGrp: [id number]  Upon attempting PA submission via CMM, since patient does NOT yet have clinical ASCVD (cardiac CT ordered), and LDL is not over 190 and no confirmed FH, will hold off on submitting PA request until AFTER CT is completed. This was discussed with MD  Patient notified via MyChart message

## 2021-07-28 NOTE — Telephone Encounter (Signed)
Pt saw Dr. Debara Pickett on 07/27/21 and he states he is getting conflicting information from his PCP office of Dr. Carollee Herter. Please advise pt further

## 2021-07-28 NOTE — Telephone Encounter (Signed)
Spoke with pt, he is getting conflicting information from what he thought he heard from dr hilty, what he was told by his medical doctor and what he was told by the pharmacy. I tried to explain and read the my chart message that was sent to the patient and he would still like to talk to dr hilty to discuss. Aware will send to dr hilty to make him aware.

## 2021-07-28 NOTE — Telephone Encounter (Signed)
Spoke with patient and after hanging up noticed that Dr. Debara Pickett sent in the [place].  Dr. Rod Mae office sent a message to patient and he did not get it.  Read it out to the patient and he had questions.  Number for Dr. Rod Mae office given to him.

## 2021-07-29 NOTE — Telephone Encounter (Signed)
Left message to call back  

## 2021-07-29 NOTE — Telephone Encounter (Signed)
Looks might there may be an insurance issue based on them requesting a change. I will route to jenna elkins dr hilty nurse to address

## 2021-08-02 NOTE — Telephone Encounter (Signed)
Returned patient phone call just now - explained why PCSK9i was denied - awaiting coronary CT scan next week - may give [place] more options. He was grateful for the callback and explaination.  Dr. Debara Pickett

## 2021-08-03 ENCOUNTER — Telehealth: Payer: Self-pay | Admitting: Family Medicine

## 2021-08-03 DIAGNOSIS — I10 Essential (primary) hypertension: Secondary | ICD-10-CM

## 2021-08-03 MED ORDER — AMLODIPINE BESYLATE 10 MG PO TABS: 10.0000 mg | ORAL_TABLET | Freq: Every day | ORAL | 2 refills | Status: DC

## 2021-08-03 NOTE — Telephone Encounter (Signed)
Refill sent.

## 2021-08-03 NOTE — Telephone Encounter (Signed)
Pt called to refill medication amLODipine (NORVASC) 10 MG tablet  currently out.  [id number] [place], [place] - [address] [id number] Phone:  [phone] Fax:  [phone]

## 2021-08-04 ENCOUNTER — Telehealth (HOSPITAL_COMMUNITY): Payer: Self-pay | Admitting: *Deleted

## 2021-08-04 NOTE — Telephone Encounter (Signed)
Reaching out to patient to offer assistance regarding upcoming cardiac imaging study; pt verbalizes understanding of appt date/time. Patient reminded that blood work was needed. He states that he is out of town and won't be able to get blood work until returns. He requested we reschedule his test. Since he was on vacation, patient also asked to be called on the afternoon of September 12 to reschedule.  Gordy Clement, RN navigator Cardiac Imaging [facility] [phone] office [phone] cell

## 2021-08-08 ENCOUNTER — Ambulatory Visit (HOSPITAL_COMMUNITY): Admission: RE | Admit: 2021-08-08 | Payer: BC Managed Care – PPO | Source: Ambulatory Visit

## 2021-08-11 ENCOUNTER — Encounter (HOSPITAL_COMMUNITY): Payer: Self-pay

## 2021-08-12 ENCOUNTER — Other Ambulatory Visit: Payer: Self-pay | Admitting: Family Medicine

## 2021-08-12 DIAGNOSIS — G4700 Insomnia, unspecified: Secondary | ICD-10-CM

## 2021-08-12 DIAGNOSIS — F419 Anxiety disorder, unspecified: Secondary | ICD-10-CM

## 2021-08-12 MED ORDER — TRAMADOL HCL 50 MG PO TABS: 50.0000 mg | ORAL_TABLET | Freq: Three times a day (TID) | ORAL | 0 refills | Status: DC | PRN

## 2021-08-12 MED ORDER — ZOLPIDEM TARTRATE 10 MG PO TABS
ORAL_TABLET | ORAL | 0 refills | Status: DC
Start: 2021-08-12 — End: 2021-11-15

## 2021-08-12 NOTE — Telephone Encounter (Signed)
Medication: 1.traMADol (ULTRAM) 50 MG tablet 2.diazepam (VALIUM) 10 MG tablet  Has the patient contacted their pharmacy? Yes.   (If no, request that the patient contact the pharmacy for the refill.) (If yes, when and what did the pharmacy advise?)  Preferred Pharmacy (with phone number or street name):  [id number] [place] [address] [place] [address] Phone:  [phone] Fax:  [phone] Agent: Please be advised that RX refills may take up to 3 business days. We ask that you follow-up with your pharmacy.

## 2021-08-12 NOTE — Telephone Encounter (Signed)
Requesting: Tramadol & Valium  Contract: N/A UDS: N/A Last OV: 07/27/21 Next OV: 12/13/21 Last Refill: 07/12/21, #90--0 RF for both Database:   Please advise

## 2021-08-15 ENCOUNTER — Telehealth: Payer: Self-pay | Admitting: Family Medicine

## 2021-08-15 NOTE — Telephone Encounter (Signed)
Medication:  diazepam (VALIUM) 10 MG tablet  Has the patient contacted their pharmacy? Yes.   (If no, request that the patient contact the pharmacy for the refill.) (If yes, when and what did the pharmacy advise?)  Preferred Pharmacy (with phone number or street name): [id number] [place], [address]  [place], [place] [address]  Phone:  [phone]    Agent: Please be advised that RX refills may take up to 3 business days. We ask that you follow-up with your pharmacy.

## 2021-08-15 NOTE — Telephone Encounter (Signed)
Requesting: diazepam 10mg  Contract: None UDS: 10/12/2020 Last Visit: 07/27/2021 w/ Larose Kells Next Visit: 12/13/2021 Last Refill: 07/12/2021 #90 and 0RF  Please Advise  Duplicate request- was sent to PCP.

## 2021-08-15 NOTE — Telephone Encounter (Signed)
Requesting: diazepam 10mg  Contract:  None UDS: 10/12/2020 Last Visit: 07/27/2021 Next Visit: 12/13/2021 Last Refill: 07/12/2021 #90 and 0RF Pt sig: 1 tab tid   Please Advise

## 2021-08-16 ENCOUNTER — Encounter (HOSPITAL_COMMUNITY): Payer: Self-pay

## 2021-08-16 ENCOUNTER — Telehealth (HOSPITAL_COMMUNITY): Payer: Self-pay | Admitting: *Deleted

## 2021-08-16 NOTE — Telephone Encounter (Signed)
Attempted to call patient regarding upcoming cardiac CT appointment. °Left message on voicemail with name and callback number ° °Mykenzi Vanzile RN Navigator Cardiac Imaging °[place] [facility] °[phone] Office °[phone] Cell ° °

## 2021-08-18 ENCOUNTER — Ambulatory Visit (HOSPITAL_COMMUNITY): Admission: RE | Admit: 2021-08-18 | Payer: BC Managed Care – PPO | Source: Ambulatory Visit

## 2021-08-19 ENCOUNTER — Telehealth (HOSPITAL_COMMUNITY): Payer: Self-pay | Admitting: *Deleted

## 2021-08-19 ENCOUNTER — Telehealth: Payer: Self-pay | Admitting: Internal Medicine

## 2021-08-19 NOTE — Telephone Encounter (Signed)
Patient would like to know which lab panels are needed prior to his CT on 08/23/21. He states he hasn't fasted today.

## 2021-08-19 NOTE — Telephone Encounter (Signed)
Per AVS: BMET, LP(a) -- complete about 1 week before CT test  CCTA scheduled 9/27  Advised labs do not have to be fasting.  Can go to [facility] or any [facility] location.   Lab orders active in the system.

## 2021-08-19 NOTE — Telephone Encounter (Signed)
Reaching out to patient regarding cardiac CT appointment to ensure patient did not have questions regarding instructions sent to his MyChart and to remind patient about getting blood work prior to appointment.  Patient states he will attempt to get blood work today and will call back if he has any questions after reviewing instructions  Gordy Clement, RN Navigator Cardiac Imaging [facility] Office: [phone]

## 2021-08-22 DIAGNOSIS — Z8249 Family history of ischemic heart disease and other diseases of the circulatory system: Secondary | ICD-10-CM | POA: Diagnosis not present

## 2021-08-22 DIAGNOSIS — E785 Hyperlipidemia, unspecified: Secondary | ICD-10-CM | POA: Diagnosis not present

## 2021-08-22 DIAGNOSIS — Z79899 Other long term (current) drug therapy: Secondary | ICD-10-CM | POA: Diagnosis not present

## 2021-08-23 ENCOUNTER — Other Ambulatory Visit: Payer: Self-pay

## 2021-08-23 ENCOUNTER — Encounter (HOSPITAL_COMMUNITY): Payer: Self-pay

## 2021-08-23 ENCOUNTER — Ambulatory Visit (HOSPITAL_COMMUNITY)
Admission: RE | Admit: 2021-08-23 | Discharge: 2021-08-23 | Disposition: A | Discharge status: Home / Self Care | Payer: BC Managed Care – PPO | Source: Ambulatory Visit | Attending: Internal Medicine | Admitting: Internal Medicine

## 2021-08-23 DIAGNOSIS — Z8249 Family history of ischemic heart disease and other diseases of the circulatory system: Secondary | ICD-10-CM | POA: Insufficient documentation

## 2021-08-23 DIAGNOSIS — R072 Precordial pain: Secondary | ICD-10-CM | POA: Diagnosis not present

## 2021-08-23 DIAGNOSIS — Z006 Encounter for examination for normal comparison and control in clinical research program: Secondary | ICD-10-CM

## 2021-08-23 LAB — NMR, LIPOPROFILE
Cholesterol, Total: 254 mg/dL — ABNORMAL HIGH (ref 100–199)
HDL Particle Number: 34.6 umol/L (ref >=30.5)
HDL-C: 45 mg/dL (ref >39)
LDL Particle Number: 2204 nmol/L — ABNORMAL HIGH (ref <1000)
LDL Size: 20 nm — ABNORMAL LOW (ref >20.5)
LDL-C (NIH Calc): 167 mg/dL — ABNORMAL HIGH (ref 0–99)
LP-IR Score: 92 — ABNORMAL HIGH (ref <=45)
Small LDL Particle Number: 1394 nmol/L — ABNORMAL HIGH (ref <=527)
Triglycerides: 225 mg/dL — ABNORMAL HIGH (ref 0–149)

## 2021-08-23 LAB — BASIC METABOLIC PANEL
BUN/Creatinine Ratio: 14 (ref 10–24)
BUN: 14 mg/dL (ref 8–27)
CO2: 20 mmol/L (ref 20–29)
Calcium: 9.5 mg/dL (ref 8.6–10.2)
Chloride: 106 mmol/L (ref 96–106)
Creatinine, Ser: 1.01 mg/dL (ref 0.76–1.27)
Glucose: 110 mg/dL — ABNORMAL HIGH (ref 70–99)
Potassium: 4.4 mmol/L (ref 3.5–5.2)
Sodium: 141 mmol/L (ref 134–144)
eGFR: 85 mL/min/{1.73_m2} (ref >59)

## 2021-08-23 LAB — LIPOPROTEIN A (LPA)
Lipoprotein (a): 9.8 nmol/L (ref <75.0)
Lipoprotein (a): 11.2 nmol/L (ref <75.0)

## 2021-08-23 MED ORDER — NITROGLYCERIN 0.4 MG SL SUBL
SUBLINGUAL_TABLET | SUBLINGUAL | Status: AC
  Filled 2021-08-23: qty 2

## 2021-08-23 MED ORDER — NITROGLYCERIN 0.4 MG SL SUBL
0.8000 mg | SUBLINGUAL_TABLET | Freq: Once | SUBLINGUAL | Status: AC
  Administered 2021-08-23: 0.8 mg via SUBLINGUAL

## 2021-08-23 MED ORDER — IOHEXOL 350 MG/ML SOLN
95.0000 mL | Freq: Once | INTRAVENOUS | Status: AC | PRN
  Administered 2021-08-23: 95 mL via INTRAVENOUS

## 2021-08-23 NOTE — Research (Signed)
IDENTIFY Informed Consent                  Subject Name: Jason Williams    Subject met inclusion and exclusion criteria.  The informed consent form, study requirements and expectations were reviewed with the subject and questions and concerns were addressed prior to the signing of the consent form.  The subject verbalized understanding of the trial requirements.  The subject agreed to participate in the IDENTIFY trial and signed the informed consent at 08:25AM on 08/23/21.  The informed consent was obtained prior to performance of any protocol-specific procedures for the subject.  A copy of the signed informed consent was given to the subject and a copy was placed in the subject's medical record.    Ledon Snare, Research Assistant

## 2021-08-24 ENCOUNTER — Telehealth: Payer: Self-pay | Admitting: Internal Medicine

## 2021-08-24 NOTE — Telephone Encounter (Signed)
Thanks for reviewing the CT with him and working on the [place]!

## 2021-08-24 NOTE — Telephone Encounter (Signed)
Patient completed CT test. Based on results, has clinical ASCVD. Will submit for auth with this diagnosis

## 2021-08-24 NOTE — Telephone Encounter (Signed)
Patient is requesting to go over his CT results.

## 2021-08-24 NOTE — Telephone Encounter (Signed)
Spoke to patient he stated he saw results of coronary ct on mychart.Stated he wanted to ask Dr.Hilty about a injectable statin.Advised I will send message to him.

## 2021-08-24 NOTE — Telephone Encounter (Signed)
Praluent approved 08/24/2021 to 08/24/2022.

## 2021-08-24 NOTE — Telephone Encounter (Signed)
PA submitted via CMM Key: [id number] - PA Case ID: [id number]

## 2021-08-24 NOTE — Telephone Encounter (Signed)
Attempted to call patient - no answer. MyChart message sent

## 2021-08-24 NOTE — Telephone Encounter (Signed)
Spoke with patient about his coronary CT results, PCSK9i - explained PA was submitted this morning, and things to work on such as diet (he reports he consumes red meat) and exercise. Advised him I will notify him when I receive a determination from insurance company.

## 2021-08-31 DIAGNOSIS — M1711 Unilateral primary osteoarthritis, right knee: Secondary | ICD-10-CM | POA: Diagnosis not present

## 2021-09-11 ENCOUNTER — Other Ambulatory Visit: Payer: Self-pay | Admitting: Family Medicine

## 2021-09-11 DIAGNOSIS — F419 Anxiety disorder, unspecified: Secondary | ICD-10-CM

## 2021-09-12 NOTE — Telephone Encounter (Signed)
Requesting: diazepam 10mg  and tramadol 50mg  Contract: None UDS: 10/12/2020 Last Visit: 07/27/2021 Next Visit: 12/13/2021 Last Refill on diazepam: 08/15/2021 #90 and 0RF Last Refill on tramadol: 08/12/2021 #90 and 0RF  Please Advise

## 2021-09-13 ENCOUNTER — Telehealth: Payer: Self-pay | Admitting: Family Medicine

## 2021-09-13 DIAGNOSIS — F419 Anxiety disorder, unspecified: Secondary | ICD-10-CM

## 2021-09-13 NOTE — Telephone Encounter (Signed)
Medication: 1.traMADol (ULTRAM) 50 MG tablet  2.diazepam (VALIUM) 10 MG tablet  Has the patient contacted their pharmacy? Yes.   (If no, request that the patient contact the pharmacy for the refill.) (If yes, when and what did the pharmacy advise?)  Preferred Pharmacy (with phone number or street name): [id number] [place], [address]  [place], [place] [address]  Phone:  [phone]  Fax:  [phone]  Agent: Please be advised that RX refills may take up to 3 business days. We ask that you follow-up with your pharmacy.

## 2021-09-13 NOTE — Telephone Encounter (Signed)
Refills already sent

## 2021-10-11 ENCOUNTER — Other Ambulatory Visit: Payer: Self-pay | Admitting: Family Medicine

## 2021-10-11 DIAGNOSIS — F419 Anxiety disorder, unspecified: Secondary | ICD-10-CM

## 2021-10-11 NOTE — Telephone Encounter (Signed)
Requesting: Tramadol & Valium Contract:  UDS: Last OV: 07/27/2021 w/ [place] Next OV: 12/13/2021 Last Refill: 09/13/21 (for both), #90--0 RF Database:   Please advise

## 2021-10-11 NOTE — Telephone Encounter (Signed)
Pt requested a refill Medication: traMADol (ULTRAM) 50 MG tablet  and  diazepam (VALIUM) 10 MG tablet   Has the patient contacted their pharmacy? Yes.    [id number] [place], [place]  [place], [place] [address]  Phone:  [phone]  Fax:  [phone]

## 2021-10-12 ENCOUNTER — Other Ambulatory Visit: Payer: Self-pay | Admitting: Family Medicine

## 2021-10-12 DIAGNOSIS — F419 Anxiety disorder, unspecified: Secondary | ICD-10-CM

## 2021-10-13 MED ORDER — DIAZEPAM 10 MG PO TABS: 10.0000 mg | ORAL_TABLET | Freq: Three times a day (TID) | ORAL | 0 refills | Status: DC

## 2021-10-13 MED ORDER — TRAMADOL HCL 50 MG PO TABS: 50.0000 mg | ORAL_TABLET | Freq: Three times a day (TID) | ORAL | 0 refills | Status: DC | PRN

## 2021-10-13 NOTE — Telephone Encounter (Signed)
Requesting: diazepam and tramadol  Contract: None UDS: 10/12/2020 Last Visit: 07/27/2021  Next Visit: 12/13/2021 Last Refill on diazepam 09/13/2021 #90 and 0RF Last Refill on tramadol: 09/13/2021 #90 and 0RF  Please Advise

## 2021-11-07 ENCOUNTER — Telehealth: Payer: Self-pay | Admitting: Internal Medicine

## 2021-11-07 DIAGNOSIS — E785 Hyperlipidemia, unspecified: Secondary | ICD-10-CM

## 2021-11-07 NOTE — Telephone Encounter (Signed)
NMR lipoprofile, LP(a) ordered for 11/10/21 visit Lab reminder message sent

## 2021-11-08 ENCOUNTER — Other Ambulatory Visit: Payer: Self-pay | Admitting: Family

## 2021-11-08 ENCOUNTER — Telehealth (INDEPENDENT_AMBULATORY_CARE_PROVIDER_SITE_OTHER): Payer: BC Managed Care – PPO | Admitting: Family

## 2021-11-08 VITALS — Ht 70.0 in | Wt 200.0 lb

## 2021-11-08 DIAGNOSIS — J209 Acute bronchitis, unspecified: Secondary | ICD-10-CM

## 2021-11-08 DIAGNOSIS — E785 Hyperlipidemia, unspecified: Secondary | ICD-10-CM | POA: Diagnosis not present

## 2021-11-08 DIAGNOSIS — J029 Acute pharyngitis, unspecified: Secondary | ICD-10-CM

## 2021-11-08 MED ORDER — LIDOCAINE VISCOUS HCL 2 % MT SOLN: 15.0000 mL | Freq: Four times a day (QID) | OROMUCOSAL | 0 refills | Status: DC | PRN

## 2021-11-08 MED ORDER — AZITHROMYCIN 250 MG PO TABS: ORAL_TABLET | ORAL | 0 refills | Status: DC

## 2021-11-08 MED ORDER — HYDROCODONE BIT-HOMATROP MBR 5-1.5 MG/5ML PO SOLN: 5.0000 mL | Freq: Three times a day (TID) | ORAL | 0 refills | Status: DC | PRN

## 2021-11-08 NOTE — Progress Notes (Signed)
Jason Williams is a 60 y.o. male with the following history as recorded in Epic[employer]:  Patient Active Problem List   Diagnosis Date Noted   Statin intolerance 07/12/2021   Primary osteoarthritis of right knee 07/12/2021   Acute pain of right knee 12/22/2020   Preventative health care 09/15/2019   Screening for HIV without presence of risk factors 09/15/2019   Encounter for hepatitis C screening test for low risk patient 09/15/2019   BPH (benign prostatic hyperplasia) 03/31/2016   GERD (gastroesophageal reflux disease) 03/18/2015   ED 10/07/2014   Carpal tunnel syndrome 12/28/2011   Primary hypertension 01/24/2011   SNORING 01/02/2011   Hyperlipidemia 11/01/2010   Anxiety state 11/01/2010   PLANTAR FASCIITIS, BILATERAL 08/15/2010   PAIN IN JOINT, ANKLE AND FOOT 03/10/2010   Unspecified episodic mood disorder 01/06/2010   Insomnia 01/24/2007    Current Outpatient Medications  Medication Sig Dispense Refill   Alirocumab (PRALUENT) 150 MG/ML SOAJ Inject 1 Dose into the skin every 14 (fourteen) days. 6 mL 3   amLODipine (NORVASC) 10 MG tablet Take 1 tablet (10 mg total) by mouth daily. 90 tablet 2   aspirin 81 MG chewable tablet TAKE 1 TABLET BY MOUTH DAILY TO THIN BLOOD TO HELP TO PREVENT HEART ATTACK AND STROKE     azithromycin (ZITHROMAX) 250 MG tablet 2 tabs po qd x 1 day; 1 tablet per day x 4 days; 6 tablet 0   diazepam (VALIUM) 10 MG tablet Take 1 tablet (10 mg total) by mouth 3 (three) times daily. 90 tablet 0   HYDROcodone bit-homatropine (HYCODAN) 5-1.5 MG/5ML syrup Take 5 mLs by mouth every 8 (eight) hours as needed for cough. 120 mL 0   lidocaine (XYLOCAINE) 2 % solution Use as directed 15 mLs in the mouth or throat every 6 (six) hours as needed for mouth pain. Gargle and spit 100 mL 0   metoprolol tartrate (LOPRESSOR) 100 MG tablet Take 1 tablet (100 mg total) by mouth once for 1 dose. Take about 2 hours prior to CT test. 1 tablet 0   tadalafil (CIALIS) 20 MG tablet Take 1  tablet (20 mg total) by mouth as needed. 30 tablet 0   traMADol (ULTRAM) 50 MG tablet Take 1 tablet (50 mg total) by mouth 3 (three) times daily as needed for moderate pain or severe pain. 90 tablet 0   zolpidem (AMBIEN) 10 MG tablet TAKE 1 TABLET BY MOUTH EVERY DAY AT BEDTIME AS NEEDED FOR SLEEP 90 tablet 0   No current facility-administered medications for this visit.    Allergies: Patient has no known allergies.  Past Medical History:  Diagnosis Date   Anxiety    Arthritis    Depression    GERD (gastroesophageal reflux disease)    Heart murmur    Hyperlipidemia    Hypertension    Sleep apnea     Past Surgical History:  Procedure Laterality Date   ankle re construction  2011   bilateral   APPENDECTOMY  1981    Family History  Problem Relation Age of Onset   Esophageal cancer Mother    Heart attack Father        Died of MI at 2   Mental illness Father    Healthy Sister    Healthy Sister    Heart disease Maternal Grandfather        MI/CABG   Heart attack Maternal Grandfather    Colon cancer Neg Hx    Rectal cancer Neg Hx  Stomach cancer Neg Hx     Social History   Tobacco Use   Smoking status: Never   Smokeless tobacco: Never  Substance Use Topics   Alcohol use: Yes    Alcohol/week: 2.0 standard drinks    Types: 2 Glasses of wine per week    Subjective:   I connected with [name] on 11/08/21 at  9:20 AM EST by a video enabled telemedicine application and verified that I am speaking with the correct person using two identifiers.   I discussed the limitations of evaluation and management by telemedicine and the availability of in person appointments. The patient expressed understanding and agreed to proceed. Provider in office/ patient is at home; provider and patient are only 2 people on video call.   3-4 day history of worsening cough/ congestion/ sore throat; negative COVID test; productive cough; no shortness of breath or chest pain; using OTC  Zicam and Nyquil;     Objective:  Vitals:   11/08/21 0913  Weight: 200 lb (90.7 kg)  Height: 5\' 10"  (1.778 m)    General: Well developed, well nourished, in no acute distress  Lungs: Respirations unlabored;  Neurologic: Alert and oriented; speech intact;   Assessment:  1. Acute bronchitis, unspecified organism   2. Sore throat     Plan:  Rx for Z-pak, Viscous Lidocaine and Hycodan; he understands and agrees to hold Valium, Ultram and Ambien while on the Hycodan; increase fluids, rest and follow up in person if symptoms are not improving; work note given as requested;   No follow-ups on file.  No orders of the defined types were placed in this encounter.   Requested Prescriptions   Signed Prescriptions Disp Refills   azithromycin (ZITHROMAX) 250 MG tablet 6 tablet 0    Sig: 2 tabs po qd x 1 day; 1 tablet per day x 4 days;   lidocaine (XYLOCAINE) 2 % solution 100 mL 0    Sig: Use as directed 15 mLs in the mouth or throat every 6 (six) hours as needed for mouth pain. Gargle and spit   HYDROcodone bit-homatropine (HYCODAN) 5-1.5 MG/5ML syrup 120 mL 0    Sig: Take 5 mLs by mouth every 8 (eight) hours as needed for cough.

## 2021-11-09 LAB — NMR, LIPOPROFILE
Cholesterol, Total: 114 mg/dL (ref 100–199)
HDL Particle Number: 38.9 umol/L (ref >=30.5)
HDL-C: 45 mg/dL (ref >39)
LDL Particle Number: 428 nmol/L (ref <1000)
LDL Size: 19.6 nm — ABNORMAL LOW (ref >20.5)
LDL-C (NIH Calc): 44 mg/dL (ref 0–99)
LP-IR Score: 72 — ABNORMAL HIGH (ref <=45)
Small LDL Particle Number: 321 nmol/L (ref <=527)
Triglycerides: 150 mg/dL — ABNORMAL HIGH (ref 0–149)

## 2021-11-09 LAB — LIPOPROTEIN A (LPA): Lipoprotein (a): 13.6 nmol/L (ref <75.0)

## 2021-11-10 ENCOUNTER — Other Ambulatory Visit: Payer: Self-pay

## 2021-11-10 ENCOUNTER — Ambulatory Visit (INDEPENDENT_AMBULATORY_CARE_PROVIDER_SITE_OTHER): Payer: BC Managed Care – PPO | Admitting: Internal Medicine

## 2021-11-10 ENCOUNTER — Encounter (HOSPITAL_BASED_OUTPATIENT_CLINIC_OR_DEPARTMENT_OTHER): Payer: Self-pay | Admitting: Internal Medicine

## 2021-11-10 VITALS — BP 136/80 | HR 85 | Ht 70.0 in | Wt 205.0 lb

## 2021-11-10 DIAGNOSIS — T466X5A Adverse effect of antihyperlipidemic and antiarteriosclerotic drugs, initial encounter: Secondary | ICD-10-CM

## 2021-11-10 DIAGNOSIS — M791 Myalgia, unspecified site: Secondary | ICD-10-CM | POA: Diagnosis not present

## 2021-11-10 DIAGNOSIS — T466X5D Adverse effect of antihyperlipidemic and antiarteriosclerotic drugs, subsequent encounter: Secondary | ICD-10-CM

## 2021-11-10 DIAGNOSIS — I2510 Atherosclerotic heart disease of native coronary artery without angina pectoris: Secondary | ICD-10-CM | POA: Diagnosis not present

## 2021-11-10 DIAGNOSIS — Z8249 Family history of ischemic heart disease and other diseases of the circulatory system: Secondary | ICD-10-CM

## 2021-11-10 DIAGNOSIS — E785 Hyperlipidemia, unspecified: Secondary | ICD-10-CM

## 2021-11-10 NOTE — Patient Instructions (Signed)
Medication Instructions:  Continue current medications  *If you need a refill on your cardiac medications before your next appointment, please call your pharmacy*   Lab Work: FASTING lab work to check cholesterol in 1 year -- complete about 1 week before next visit  If you have labs (blood work) drawn today and your tests are completely normal, you will receive your results only by: [place] (if you have MyChart) OR A paper copy in the mail If you have any lab test that is abnormal or we need to change your treatment, we will call you to review the results.   Follow-Up: At [facility], you and your health needs are our priority.  As part of our continuing mission to provide you with exceptional heart care, we have created designated Provider Care Teams.  These Care Teams include your primary Cardiologist (physician) and Advanced Practice Providers (APPs -  Physician Assistants and Nurse Practitioners) who all work together to provide you with the care you need, when you need it.  We recommend signing up for the patient portal called "MyChart".  Sign up information is provided on this After Visit Summary.  MyChart is used to connect with patients for Virtual Visits (Telemedicine).  Patients are able to view lab/test results, encounter notes, upcoming appointments, etc.  Non-urgent messages can be sent to your provider as well.   To learn more about what you can do with MyChart, go to [email or web address].    Your next appointment:   12 months with Dr. Debara Pickett -- lipid clinic   Website for [place] co-pay card: [email or web address]

## 2021-11-10 NOTE — Progress Notes (Signed)
LIPID CLINIC CONSULT NOTE  Chief Complaint:  Follow-up dyslipidemia  Primary Care Physician: Carollee Herter, Alferd Apa, DO  Primary Cardiologist:  None  HPI:  Jason Williams is a 60 y.o. male who is being seen today for the evaluation of dyslipidemia at the request of [name], *.  This is a pleasant 60 year old male kindly referred by her evaluation and management of dyslipidemia.  Jason Williams has longstanding elevated cholesterol but unfortunately has been intolerant to statins causing significant side effects, primarily myalgias.  Recent labs showed total cholesterol 250, triglycerides 230, HDL 41 and LDL 166.  He had been seen remotely by heart care in 2015 in 2016 for chest pain but no clear cardiac cause was noted.  No in 2016 showed normal systolic function with mild left atrial enlargement.  He also underwent Myoview stress testing, which showed no reversible perfusion defects.  Other cardiovascular risk factors include anxiety/depression, hypertension and obstructive sleep apnea.  He does report significant early onset heart disease in his family including a father who died of heart attack in his 75s.  Jason Williams also reports recent episodes of chest discomfort.  This has if feels like a heaviness in his chest that is sometimes associated with exertion and relieved by rest but can occur at rest as well.  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  He said he had a recent EKG which showed no ischemic changes.  He is also had some other testing in the past as well.  11/10/2021  Jason Williams returns today for follow-up.  He has had a remarkable response to Praluent however had significant difficulty getting approval.  He is tolerating it well, much better than he had the statin medications.  His LDL-P is come down significantly from 2204 down to 428, LDL-C of 44 (down from 167), HDL  unchanged at 45 and triglycerides have improved to 150 from 254.  Small LDL-P is decreased significantly from 1394 down to 321.  Overall a marked improvement in his lipids.  This was also in response to the fact that we found he had moderate coronary artery disease by CT coronary angiography.  This demonstrated a calcium score 387, 88th percentile for age and sex matched control but only mild to moderate nonobstructive coronary disease.  PMHx:  Past Medical History:  Diagnosis Date   Anxiety    Arthritis    Depression    GERD (gastroesophageal reflux disease)    Heart murmur    Hyperlipidemia    Hypertension    Sleep apnea     Past Surgical History:  Procedure Laterality Date   ankle re construction  2011   bilateral   APPENDECTOMY  1981    FAMHx:  Family History  Problem Relation Age of Onset   Esophageal cancer Mother    Heart attack Father        Died of MI at 50   Mental illness Father    Healthy Sister    Healthy Sister    Heart disease Maternal Grandfather        MI/CABG   Heart attack Maternal Grandfather    Colon cancer Neg Hx    Rectal cancer Neg Hx    Stomach cancer Neg Hx     SOCHx:   reports that he has never smoked. He has never used smokeless tobacco. He reports current alcohol use of about 2.0  standard drinks per week. He reports that he does not use drugs.  ALLERGIES:  No Known Allergies  ROS: Pertinent items noted in HPI and remainder of comprehensive ROS otherwise negative.  HOME MEDS: Current Outpatient Medications on File Prior to Visit  Medication Sig Dispense Refill   Alirocumab (PRALUENT) 150 MG/ML SOAJ Inject 1 Dose into the skin every 14 (fourteen) days. 6 mL 3   amLODipine (NORVASC) 10 MG tablet Take 1 tablet (10 mg total) by mouth daily. 90 tablet 2   aspirin 81 MG chewable tablet TAKE 1 TABLET BY MOUTH DAILY TO THIN BLOOD TO HELP TO PREVENT HEART ATTACK AND STROKE     azithromycin (ZITHROMAX) 250 MG tablet 2 tabs po qd x 1 day; 1  tablet per day x 4 days; 6 tablet 0   diazepam (VALIUM) 10 MG tablet Take 1 tablet (10 mg total) by mouth 3 (three) times daily. 90 tablet 0   HYDROcodone bit-homatropine (HYCODAN) 5-1.5 MG/5ML syrup Take 5 mLs by mouth every 8 (eight) hours as needed for cough. 120 mL 0   lidocaine (XYLOCAINE) 2 % solution Use as directed 15 mLs in the mouth or throat every 6 (six) hours as needed for mouth pain. Gargle and spit 100 mL 0   metoprolol tartrate (LOPRESSOR) 100 MG tablet Take 1 tablet (100 mg total) by mouth once for 1 dose. Take about 2 hours prior to CT test. 1 tablet 0   tadalafil (CIALIS) 20 MG tablet Take 1 tablet (20 mg total) by mouth as needed. 30 tablet 0   traMADol (ULTRAM) 50 MG tablet Take 1 tablet (50 mg total) by mouth 3 (three) times daily as needed for moderate pain or severe pain. 90 tablet 0   zolpidem (AMBIEN) 10 MG tablet TAKE 1 TABLET BY MOUTH EVERY DAY AT BEDTIME AS NEEDED FOR SLEEP 90 tablet 0   No current facility-administered medications on file prior to visit.    LABS/IMAGING: No results found for this or any previous visit (from the past 48 hour(s)). No results found.  LIPID PANEL:    Component Value Date/Time   CHOL 250 (H) 10/12/2020 1502   TRIG 230 (H) 10/12/2020 1502   HDL 41 10/12/2020 1502   CHOLHDL 6.1 (H) 10/12/2020 1502   CHOLHDL 5.2 (H) 03/31/2016 1125   VLDL 20 03/31/2016 1125   LDLCALC 166 (H) 10/12/2020 1502    WEIGHTS: Wt Readings from Last 3 Encounters:  11/10/21 205 lb (93 kg)  11/08/21 200 lb (90.7 kg)  07/27/21 205 lb (93 kg)    VITALS: BP 136/80    Pulse 85    Ht 5\' 10"  (1.778 m)    Wt 205 lb (93 kg)    SpO2 96%    BMI 29.41 kg/m   EXAM: Deferred  EKG: N/A  ASSESSMENT: Chest pain  -coronary CTA showed nonobstructive coronary disease with a calcium score of 187, 88th percentile for sex and age matched control.  (07/2021) Mixed dyslipidemia, goal LDL less than 70 Statin intolerance-myalgias Strong family history of premature  coronary disease Hypertension  PLAN: 1.   Jason Williams unfortunately has been statin intolerant but is tolerating the Praluent very well.  He has had marked reduction in his lipids and now is at goal across the board.  He needs aggressive therapy because he was found to have mild to moderate nonobstructive coronary disease by cath however calcium score is age advanced at 68th percentile, consistent with a strong family history of early heart disease.  Hopefully this continued aggressive therapy will reduce his risk going forward.  We have encouraged him to reach out to the [employer] as there is a downloadable co-pay card option that may help with the cost of his medicine.  Plan follow-up with me annually or sooner if necessary.  Pixie Casino, MD, [facility], [place] Director of the Advanced Lipid Disorders &  Cardiovascular Risk Reduction Clinic Diplomate of the American Board of Clinical Lipidology Attending Cardiologist  Direct Dial: [phone]   Fax: [phone]  Website:  [email or web address] Athira Janowicz 11/10/2021, 11:24 AM

## 2021-11-15 ENCOUNTER — Other Ambulatory Visit: Payer: Self-pay | Admitting: Family Medicine

## 2021-11-15 DIAGNOSIS — F419 Anxiety disorder, unspecified: Secondary | ICD-10-CM

## 2021-11-15 DIAGNOSIS — G4700 Insomnia, unspecified: Secondary | ICD-10-CM

## 2021-11-15 MED ORDER — TRAMADOL HCL 50 MG PO TABS: 50.0000 mg | ORAL_TABLET | Freq: Three times a day (TID) | ORAL | 0 refills | Status: DC | PRN

## 2021-11-15 MED ORDER — ZOLPIDEM TARTRATE 10 MG PO TABS: ORAL_TABLET | ORAL | 0 refills | Status: DC

## 2021-11-15 NOTE — Telephone Encounter (Signed)
Medication: diazepam (VALIUM) 10 MG tablet   traMADol (ULTRAM) 50 MG tablet   Has the patient contacted their pharmacy? No.   Preferred Pharmacy: [id number] - [place], [place]  [place], [place] [address]  Phone:  [phone]  Fax:  [phone]

## 2021-11-15 NOTE — Telephone Encounter (Signed)
Requesting: Valium &Tramadol  Contract: 2021 UDS: 2021 Last OV: 11/08/21 w/ Mickel Baas, virtual visit Next OV:  12/13/2021 Last Refill: 10/13/2021, #90--0 RF & #90--0 RF Database:   Please advise

## 2021-11-16 NOTE — Telephone Encounter (Signed)
Requesting: diazepam 10mg   Contract: None UDS:10/12/2020 Last Visit: 07/12/2021 Next Visit: 12/13/2021 Last Refill: 10/13/2021 #90 and 0RF Pt sig: 1 tab tid   Please Advise

## 2021-11-30 ENCOUNTER — Ambulatory Visit (HOSPITAL_BASED_OUTPATIENT_CLINIC_OR_DEPARTMENT_OTHER): Payer: BC Managed Care – PPO | Admitting: Internal Medicine

## 2021-12-13 ENCOUNTER — Encounter: Payer: Self-pay | Admitting: Family Medicine

## 2021-12-13 ENCOUNTER — Ambulatory Visit (INDEPENDENT_AMBULATORY_CARE_PROVIDER_SITE_OTHER): Payer: BC Managed Care – PPO | Admitting: Family Medicine

## 2021-12-13 VITALS — BP 132/98 | HR 79 | Temp 97.8°F | Resp 18 | Ht 70.0 in | Wt 208.4 lb

## 2021-12-13 DIAGNOSIS — I10 Essential (primary) hypertension: Secondary | ICD-10-CM | POA: Diagnosis not present

## 2021-12-13 DIAGNOSIS — Z23 Encounter for immunization: Secondary | ICD-10-CM

## 2021-12-13 DIAGNOSIS — Z79899 Other long term (current) drug therapy: Secondary | ICD-10-CM

## 2021-12-13 DIAGNOSIS — M25571 Pain in right ankle and joints of right foot: Secondary | ICD-10-CM

## 2021-12-13 DIAGNOSIS — F419 Anxiety disorder, unspecified: Secondary | ICD-10-CM | POA: Diagnosis not present

## 2021-12-13 DIAGNOSIS — G894 Chronic pain syndrome: Secondary | ICD-10-CM

## 2021-12-13 DIAGNOSIS — G4700 Insomnia, unspecified: Secondary | ICD-10-CM | POA: Diagnosis not present

## 2021-12-13 DIAGNOSIS — E785 Hyperlipidemia, unspecified: Secondary | ICD-10-CM

## 2021-12-13 DIAGNOSIS — Z0000 Encounter for general adult medical examination without abnormal findings: Secondary | ICD-10-CM

## 2021-12-13 DIAGNOSIS — M1711 Unilateral primary osteoarthritis, right knee: Secondary | ICD-10-CM

## 2021-12-13 DIAGNOSIS — M25561 Pain in right knee: Secondary | ICD-10-CM

## 2021-12-13 DIAGNOSIS — G8929 Other chronic pain: Secondary | ICD-10-CM

## 2021-12-13 LAB — CBC WITH DIFFERENTIAL/PLATELET
Basophils Absolute: 0 10*3/uL (ref 0.0–0.1)
Basophils Relative: 0.3 % (ref 0.0–3.0)
Eosinophils Absolute: 0.1 10*3/uL (ref 0.0–0.7)
Eosinophils Relative: 2.8 % (ref 0.0–5.0)
HCT: 46.3 % (ref 39.0–52.0)
Hemoglobin: 15.2 g/dL (ref 13.0–17.0)
Lymphocytes Relative: 25.2 % (ref 12.0–46.0)
Lymphs Abs: 1.2 10*3/uL (ref 0.7–4.0)
MCHC: 32.9 g/dL (ref 30.0–36.0)
MCV: 92.4 fl (ref 78.0–100.0)
Monocytes Absolute: 0.5 10*3/uL (ref 0.1–1.0)
Monocytes Relative: 10.1 % (ref 3.0–12.0)
Neutro Abs: 3 10*3/uL (ref 1.4–7.7)
Neutrophils Relative %: 61.6 % (ref 43.0–77.0)
Platelets: 226 10*3/uL (ref 150.0–400.0)
RBC: 5.01 Mil/uL (ref 4.22–5.81)
RDW: 13.6 % (ref 11.5–15.5)
WBC: 4.8 10*3/uL (ref 4.0–10.5)

## 2021-12-13 LAB — COMPREHENSIVE METABOLIC PANEL
ALT: 35 U/L (ref 0–53)
AST: 21 U/L (ref 0–37)
Albumin: 4.5 g/dL (ref 3.5–5.2)
Alkaline Phosphatase: 53 U/L (ref 39–117)
BUN: 14 mg/dL (ref 6–23)
CO2: 30 mEq/L (ref 19–32)
Calcium: 9.3 mg/dL (ref 8.4–10.5)
Chloride: 105 mEq/L (ref 96–112)
Creatinine, Ser: 1.03 mg/dL (ref 0.40–1.50)
GFR: 78.93 mL/min (ref >60.00)
Glucose, Bld: 100 mg/dL — ABNORMAL HIGH (ref 70–99)
Potassium: 4.4 mEq/L (ref 3.5–5.1)
Sodium: 141 mEq/L (ref 135–145)
Total Bilirubin: 0.5 mg/dL (ref 0.2–1.2)
Total Protein: 7.2 g/dL (ref 6.0–8.3)

## 2021-12-13 LAB — PSA: PSA: 3.72 ng/mL (ref 0.10–4.00)

## 2021-12-13 LAB — TSH: TSH: 3.38 u[IU]/mL (ref 0.35–5.50)

## 2021-12-13 MED ORDER — TRAMADOL HCL 50 MG PO TABS: 50.0000 mg | ORAL_TABLET | Freq: Three times a day (TID) | ORAL | 0 refills | Status: DC | PRN

## 2021-12-13 MED ORDER — DIAZEPAM 10 MG PO TABS: 10.0000 mg | ORAL_TABLET | Freq: Three times a day (TID) | ORAL | 0 refills | Status: DC

## 2021-12-13 MED ORDER — CHLORHEXIDINE GLUCONATE 0.12% ORAL RINSE (MEDLINE KIT): 15.0000 mL | Freq: Two times a day (BID) | OROMUCOSAL | 0 refills | Status: DC

## 2021-12-13 MED ORDER — ZOLPIDEM TARTRATE 10 MG PO TABS: ORAL_TABLET | ORAL | 0 refills | Status: DC

## 2021-12-13 NOTE — Assessment & Plan Note (Signed)
High today but did come down when repeated

## 2021-12-13 NOTE — Patient Instructions (Signed)

## 2021-12-13 NOTE — Assessment & Plan Note (Signed)
On praluent per cardiology

## 2021-12-13 NOTE — Assessment & Plan Note (Signed)
ghm utd Check labs  Flu shot today He will get shingrix / covid booster another day

## 2021-12-13 NOTE — Assessment & Plan Note (Signed)
Refer to ortho  Pain 8-9/10

## 2021-12-13 NOTE — Progress Notes (Signed)
Subjective:   By signing my name below, I, Jason Williams, attest that this documentation has been prepared under the direction and in the presence of Dr. [name], DO. 12/13/2021    Patient ID: Jason Williams, male    DOB: Nov 04, 1961, 61 y.o.   MRN: [id number]  Chief Complaint  Patient presents with   Annual Exam    Pt states fasting.     HPI Patient is in today for a comprehensive physical exam.  He continues having right knee pain. He reports receiving an injection to manage his pain and found mild relief. He is seeing a sports medicine specialist to manage his pain but found no change in his pain.  He is requesting a refill on 10 mg valium daily 3x PO, 10 mg Ambien PRN, 50 mg tramadol daily PO.  His blood pressure is elevated during this visit. He has taken 10 mg amlodipine PO this morning and reports no new issues while taking it.  BP Readings from Last 3 Encounters:  12/13/21 (!) 132/98  11/10/21 136/80  08/23/21 (!) 137/93   Pulse Readings from Last 3 Encounters:  12/13/21 79  11/10/21 85  08/23/21 66   He denies having any fever, new moles, congestion, sore throat, new muscle pain, new joint pain, chest pain, cough, SOB, wheezing, n/v/d, constipation, blood in stool, dysuria, frequency, hematuria, or headaches at this time. He prefers to check is PSA through blood work during this visit.  He is willing to receive the flu vaccine during this visit. He has 2 pfizer Covid-19 vaccines at this time. He was informed of the bivalent Covid-19 vaccine.  He is due for vision care and is planning on scheduling an appointment.  He is UTD on dental care. He reports requiring a crown on his tooth but cannot afford the charge for seeing an orthodontist. His dentist recommend he try an oral rinse to manage his issues at this time.    Past Medical History:  Diagnosis Date   Anxiety    Arthritis    Depression    GERD (gastroesophageal reflux disease)    Heart murmur     Hyperlipidemia    Hypertension    Sleep apnea     Past Surgical History:  Procedure Laterality Date   ankle re construction  2011   bilateral   APPENDECTOMY  1981    Family History  Problem Relation Age of Onset   Esophageal cancer Mother    Heart attack Father        Died of MI at 11   Mental illness Father    Healthy Sister    Healthy Sister    Heart disease Maternal Grandfather        MI/CABG   Heart attack Maternal Grandfather    Colon cancer Neg Hx    Rectal cancer Neg Hx    Stomach cancer Neg Hx     Social History   Socioeconomic History   Marital status: Single    Spouse name: Not on file   Number of children: 1   Years of education: Not on file   Highest education level: Not on file  Occupational History   Occupation: Lobbyist: [employer]  Tobacco Use   Smoking status: Never   Smokeless tobacco: Never  Vaping Use   Vaping Use: Never used  Substance and Sexual Activity   Alcohol use: Yes    Alcohol/week: 2.0 standard drinks    Types: 2 Glasses  of wine per week   Drug use: No   Sexual activity: Yes    Birth control/protection: Pill, None    Comment: 1 male  Other Topics Concern   Not on file  Social History Narrative   Not on file   Social Determinants of Health   Financial Resource Strain: Not on file  Food Insecurity: Not on file  Transportation Needs: Not on file  Physical Activity: Not on file  Stress: Not on file  Social Connections: Not on file  Intimate Partner Violence: Not on file    Outpatient Medications Prior to Visit  Medication Sig Dispense Refill   Alirocumab (PRALUENT) 150 MG/ML SOAJ Inject 1 Dose into the skin every 14 (fourteen) days. 6 mL 3   amLODipine (NORVASC) 10 MG tablet Take 1 tablet (10 mg total) by mouth daily. 90 tablet 2   aspirin 81 MG chewable tablet TAKE 1 TABLET BY MOUTH DAILY TO THIN BLOOD TO HELP TO PREVENT HEART ATTACK AND STROKE     lidocaine (XYLOCAINE) 2 % solution Use as directed  15 mLs in the mouth or throat every 6 (six) hours as needed for mouth pain. Gargle and spit 100 mL 0   tadalafil (CIALIS) 20 MG tablet Take 1 tablet (20 mg total) by mouth as needed. 30 tablet 0   diazepam (VALIUM) 10 MG tablet TAKE 1 TABLET BY MOUTH THREE TIMES A DAY 90 tablet 0   traMADol (ULTRAM) 50 MG tablet Take 1 tablet (50 mg total) by mouth 3 (three) times daily as needed for moderate pain or severe pain. 90 tablet 0   zolpidem (AMBIEN) 10 MG tablet TAKE 1 TABLET BY MOUTH EVERY DAY AT BEDTIME AS NEEDED FOR SLEEP 90 tablet 0   azithromycin (ZITHROMAX) 250 MG tablet 2 tabs po qd x 1 day; 1 tablet per day x 4 days; (Patient not taking: Reported on 12/13/2021) 6 tablet 0   HYDROcodone bit-homatropine (HYCODAN) 5-1.5 MG/5ML syrup Take 5 mLs by mouth every 8 (eight) hours as needed for cough. (Patient not taking: Reported on 12/13/2021) 120 mL 0   metoprolol tartrate (LOPRESSOR) 100 MG tablet Take 1 tablet (100 mg total) by mouth once for 1 dose. Take about 2 hours prior to CT test. 1 tablet 0   No facility-administered medications prior to visit.    No Known Allergies  Review of Systems  Constitutional:  Negative for chills, fever and malaise/fatigue.  HENT:  Negative for congestion, hearing loss and sore throat.   Eyes:  Negative for blurred vision and discharge.  Respiratory:  Negative for cough, sputum production, shortness of breath and wheezing.   Cardiovascular:  Negative for chest pain, palpitations and leg swelling.  Gastrointestinal:  Negative for abdominal pain, blood in stool, constipation, diarrhea, heartburn, nausea and vomiting.  Genitourinary:  Negative for dysuria, frequency, hematuria and urgency.  Musculoskeletal:  Negative for back pain, falls, joint pain and myalgias.       (+)right knee pain  Skin:  Negative for rash.       (-)New moles  Neurological:  Negative for dizziness, sensory change, loss of consciousness, weakness and headaches.  Endo/Heme/Allergies:   Negative for environmental allergies. Does not bruise/bleed easily.  Psychiatric/Behavioral:  Negative for depression and suicidal ideas. The patient is not nervous/anxious and does not have insomnia.       Objective:    Physical Exam Vitals and nursing note reviewed.  Constitutional:      General: He is not in acute distress.  Appearance: Normal appearance. He is well-developed. He is not ill-appearing or diaphoretic.  HENT:     Head: Normocephalic and atraumatic.     Right Ear: Tympanic membrane, ear canal and external ear normal.     Left Ear: Tympanic membrane, ear canal and external ear normal.     Nose: Nose normal.     Mouth/Throat:     Pharynx: No oropharyngeal exudate.  Eyes:     General:        Right eye: No discharge.        Left eye: No discharge.     Extraocular Movements: Extraocular movements intact.     Conjunctiva/sclera: Conjunctivae normal.     Pupils: Pupils are equal, round, and reactive to light.  Neck:     Thyroid: No thyromegaly.     Vascular: No JVD.  Cardiovascular:     Rate and Rhythm: Normal rate and regular rhythm.     Heart sounds: Normal heart sounds. No murmur heard.   No friction rub. No gallop.  Pulmonary:     Effort: Pulmonary effort is normal. No respiratory distress.     Breath sounds: Normal breath sounds. No wheezing or rales.  Chest:     Chest wall: No tenderness.  Abdominal:     General: Bowel sounds are normal. There is no distension.     Palpations: Abdomen is soft. There is no mass.     Tenderness: There is no abdominal tenderness. There is no guarding or rebound.  Musculoskeletal:        General: Tenderness present. Normal range of motion.     Cervical back: Normal range of motion and neck supple.     Comments: Chronic r knee pain  Lymphadenopathy:     Cervical: No cervical adenopathy.  Skin:    General: Skin is warm and dry.     Coloration: Skin is not pale.     Findings: No erythema or rash.  Neurological:      General: No focal deficit present.     Mental Status: He is alert and oriented to person, place, and time.     Motor: No abnormal muscle tone.     Deep Tendon Reflexes: Reflexes are normal and symmetric. Reflexes normal.  Psychiatric:        Behavior: Behavior normal.        Thought Content: Thought content normal.        Judgment: Judgment normal.    BP (!) 132/98    Pulse 79    Temp 97.8 F (36.6 C) (Oral)    Resp 18    Ht _0  (1.778 m)    Wt 208 lb 6.4 oz (94.5 kg)    SpO2 96%    BMI 29.90 kg/m  Wt Readings from Last 3 Encounters:  12/13/21 208 lb 6.4 oz (94.5 kg)  11/10/21 205 lb (93 kg)  11/08/21 200 lb (90.7 kg)    Diabetic Foot Exam - Simple   No data filed    Lab Results  Component Value Date   WBC 5.4 03/31/2016   HGB 15.1 03/31/2016   HCT 43.9 03/31/2016   PLT 242 03/31/2016   GLUCOSE 110 (H) 08/22/2021   CHOL 250 (H) 10/12/2020   TRIG 230 (H) 10/12/2020   HDL 41 10/12/2020   LDLCALC 166 (H) 10/12/2020   ALT 27 09/15/2019   AST 19 09/15/2019   NA 141 08/22/2021   K 4.4 08/22/2021   CL 106 08/22/2021   CREATININE 1.01  08/22/2021   BUN 14 08/22/2021   CO2 20 08/22/2021   TSH 2.58 03/31/2016   PSA 3.27 03/31/2016   HGBA1C 5.3 01/25/2015    Lab Results  Component Value Date   TSH 2.58 03/31/2016   Lab Results  Component Value Date   WBC 5.4 03/31/2016   HGB 15.1 03/31/2016   HCT 43.9 03/31/2016   MCV 91.5 03/31/2016   PLT 242 03/31/2016   Lab Results  Component Value Date   NA 141 08/22/2021   K 4.4 08/22/2021   CO2 20 08/22/2021   GLUCOSE 110 (H) 08/22/2021   BUN 14 08/22/2021   CREATININE 1.01 08/22/2021   BILITOT 0.5 09/15/2019   ALKPHOS 57 09/15/2019   AST 19 09/15/2019   ALT 27 09/15/2019   PROT 7.0 09/15/2019   ALBUMIN 4.4 09/15/2019   CALCIUM 9.5 08/22/2021   EGFR 85 08/22/2021   Lab Results  Component Value Date   CHOL 250 (H) 10/12/2020   Lab Results  Component Value Date   HDL 41 10/12/2020   Lab Results   Component Value Date   LDLCALC 166 (H) 10/12/2020   Lab Results  Component Value Date   TRIG 230 (H) 10/12/2020   Lab Results  Component Value Date   CHOLHDL 6.1 (H) 10/12/2020   Lab Results  Component Value Date   HGBA1C 5.3 01/25/2015   Colonoscopy- Last completed 11/13/2019. Results showed: - One 4 mm polyp in the cecum, removed with a cold snare. Resected and retrieved. - The examination was otherwise normal on direct and retroflexion views. Otherwise results are normal.  PSA- Last completed 03/31/2016. Results showed 3.27.      Assessment & Plan:   Problem List Items Addressed This Visit       Unprioritized   Insomnia   Relevant Medications   zolpidem (AMBIEN) 10 MG tablet   Hyperlipidemia    On praluent per cardiology      Relevant Orders   Comprehensive metabolic panel   PAIN IN JOINT, ANKLE AND FOOT    Database reviewed  Contract and uds updated  Refill tramadol       Preventative health care - Primary    ghm utd Check labs  Flu shot today He will get shingrix / covid booster another day       Relevant Orders   Comprehensive metabolic panel   PSA   TSH   CBC with Differential/Platelet   Primary hypertension    High today but did come down when repeated       Relevant Orders   Comprehensive metabolic panel   PSA   TSH   CBC with Differential/Platelet   Primary osteoarthritis of right knee    Refer to ortho  Pain 8-9/10      Relevant Medications   traMADol (ULTRAM) 50 MG tablet   Other Visit Diagnoses     Anxiety       Relevant Medications   diazepam (VALIUM) 10 MG tablet   Other Relevant Orders   Drug Monitoring Panel [phone], Urine   Need for influenza vaccination       Relevant Orders   Flu Vaccine QUAD 82moIM (Fluarix, Fluzone & Alfiuria Quad PF) (Completed)   Chronic pain syndrome       Relevant Medications   traMADol (ULTRAM) 50 MG tablet   Other Relevant Orders   Drug Monitoring Panel [id number] Urine   Chronic pain of  right knee       Relevant Orders  Ambulatory referral to Orthopedic Surgery   High risk medication use       Relevant Orders   Drug Monitoring Panel [phone], Urine        Meds ordered this encounter  Medications   diazepam (VALIUM) 10 MG tablet    Sig: Take 1 tablet (10 mg total) by mouth 3 (three) times daily.    Dispense:  90 tablet    Refill:  0    Not to exceed 5 additional fills before 04/11/2022 DX Code Needed  PT REQ REF.Marland Kitchen   zolpidem (AMBIEN) 10 MG tablet    Sig: TAKE 1 TABLET BY MOUTH EVERY DAY AT BEDTIME AS NEEDED FOR SLEEP    Dispense:  90 tablet    Refill:  0    This request is for a new prescription for a controlled substance as required by Federal/State law.   traMADol (ULTRAM) 50 MG tablet    Sig: Take 1 tablet (50 mg total) by mouth 3 (three) times daily as needed for moderate pain or severe pain.    Dispense:  90 tablet    Refill:  0    Not to exceed 5 additional fills before 03/15/2023REFILL.   chlorhexidine gluconate, MEDLINE KIT, (PERIDEX) 0.12 % solution    Sig: Use as directed 15 mLs in the mouth or throat 2 (two) times daily.    Dispense:  120 mL    Refill:  0    I, Dr. [name], DO, personally preformed the services described in this documentation.  All medical record entries made by the scribe were at my direction and in my presence.  I have reviewed the chart and discharge instructions (if applicable) and agree that the record reflects my personal performance and is accurate and complete. 12/13/2021   I,Jason Williams,acting as a scribe for [name], DO.,have documented all relevant documentation on the behalf of [name], DO,as directed by  [name], DO while in the presence of [name], DO.   [name], DO

## 2021-12-13 NOTE — Assessment & Plan Note (Signed)
Database reviewed  Contract and uds updated  Refill tramadol

## 2021-12-18 LAB — DRUG MONITORING PANEL 375977 , URINE
Alcohol Metabolites: NEGATIVE ng/mL (ref <500)
Alphahydroxyalprazolam: NEGATIVE ng/mL (ref <25)
Alphahydroxymidazolam: NEGATIVE ng/mL (ref <50)
Alphahydroxytriazolam: NEGATIVE ng/mL (ref <50)
Aminoclonazepam: NEGATIVE ng/mL (ref <25)
Amphetamines: NEGATIVE ng/mL (ref <500)
Barbiturates: NEGATIVE ng/mL (ref <300)
Benzodiazepines: POSITIVE ng/mL — AB (ref <100)
Cocaine Metabolite: NEGATIVE ng/mL (ref <150)
Desmethyltramadol: 1878 ng/mL — ABNORMAL HIGH (ref <100)
Hydroxyethylflurazepam: NEGATIVE ng/mL (ref <50)
Lorazepam: NEGATIVE ng/mL (ref <50)
Marijuana Metabolite: 27 ng/mL — ABNORMAL HIGH (ref <5)
Marijuana Metabolite: POSITIVE ng/mL — AB (ref <20)
Nordiazepam: 1248 ng/mL — ABNORMAL HIGH (ref <50)
Opiates: NEGATIVE ng/mL (ref <100)
Oxazepam: 2170 ng/mL — ABNORMAL HIGH (ref <50)
Oxycodone: NEGATIVE ng/mL (ref <100)
Temazepam: 1889 ng/mL — ABNORMAL HIGH (ref <50)
Tramadol: 2968 ng/mL — ABNORMAL HIGH (ref <100)

## 2021-12-18 LAB — DM TEMPLATE

## 2022-01-10 ENCOUNTER — Telehealth: Payer: Self-pay | Admitting: Family Medicine

## 2022-01-10 NOTE — Telephone Encounter (Signed)
Pt would like to know if should come in to have labs done.  Please advise.

## 2022-01-11 ENCOUNTER — Other Ambulatory Visit: Payer: Self-pay

## 2022-01-11 DIAGNOSIS — Z79899 Other long term (current) drug therapy: Secondary | ICD-10-CM

## 2022-01-11 NOTE — Telephone Encounter (Signed)
Spoke with patient. Lab appointment schedule for UDS. Orders placed

## 2022-01-11 NOTE — Telephone Encounter (Signed)
Pt seen on 12/13/21 for CPE and had labs drawn. In the note it states to follow up in 6 months but the UDS was positive for marijuana. Do you want the patient to follow up in 6 months or sooner?

## 2022-01-13 ENCOUNTER — Other Ambulatory Visit: Payer: Self-pay | Admitting: Family Medicine

## 2022-01-13 DIAGNOSIS — F419 Anxiety disorder, unspecified: Secondary | ICD-10-CM

## 2022-01-13 DIAGNOSIS — G894 Chronic pain syndrome: Secondary | ICD-10-CM

## 2022-01-13 NOTE — Telephone Encounter (Signed)
Requesting: diazepam 10MG , tramadol 50MG  Contract: 12/13/21 UDS: 12/13/21 Last Visit: 12/13/21 Next Visit: 04/10/22 Last Refill: 12/13/21, #90, 0 refills  Please Advise

## 2022-01-26 DIAGNOSIS — M7051 Other bursitis of knee, right knee: Secondary | ICD-10-CM | POA: Diagnosis not present

## 2022-02-13 ENCOUNTER — Other Ambulatory Visit: Payer: Self-pay

## 2022-02-13 ENCOUNTER — Other Ambulatory Visit: Payer: Self-pay | Admitting: Family Medicine

## 2022-02-13 DIAGNOSIS — G894 Chronic pain syndrome: Secondary | ICD-10-CM

## 2022-02-13 DIAGNOSIS — F419 Anxiety disorder, unspecified: Secondary | ICD-10-CM

## 2022-02-13 DIAGNOSIS — G4700 Insomnia, unspecified: Secondary | ICD-10-CM

## 2022-02-13 MED ORDER — ZOLPIDEM TARTRATE 10 MG PO TABS: ORAL_TABLET | ORAL | 0 refills | Status: DC

## 2022-02-13 MED ORDER — TRAMADOL HCL 50 MG PO TABS
50.0000 mg | ORAL_TABLET | Freq: Three times a day (TID) | ORAL | 0 refills | Status: DC | PRN
Start: 2022-02-13 — End: 2022-03-15

## 2022-02-13 NOTE — Telephone Encounter (Signed)
Requesting: tramadol ?Contract: 12/13/21 ?UDS: 12/13/21 ?Last Visit: 12/13/21 ?Next Visit: none ?Last Refill: 01/16/22 ? ?Please Advise ? ?

## 2022-02-14 ENCOUNTER — Other Ambulatory Visit: Payer: Self-pay | Admitting: Family Medicine

## 2022-02-14 DIAGNOSIS — F419 Anxiety disorder, unspecified: Secondary | ICD-10-CM

## 2022-02-14 MED ORDER — DIAZEPAM 10 MG PO TABS: 10.0000 mg | ORAL_TABLET | Freq: Three times a day (TID) | ORAL | 0 refills | Status: DC

## 2022-02-14 NOTE — Telephone Encounter (Signed)
Requesting: diazepam '10mg'$   ?Contract: 12/13/2021 ?UDS: 12/13/2021 ?Last Visit: 12/13/2021 ?Next Visit: None ?Last Refill: 01/16/2022 #90 and 0RF ?Pt sig: 1 tab tid prn ? ?Please Advise ? ?

## 2022-02-14 NOTE — Addendum Note (Signed)
Addended by: Sanda Linger on: 02/14/2022 10:31 AM ? ? Modules accepted: Orders ? ?

## 2022-02-14 NOTE — Telephone Encounter (Signed)
Medication:  ?diazepam (VALIUM) 10 MG tablet [[id number]]  ?  ? ?Has the patient contacted their pharmacy? No. ?(If no, request that the patient contact the pharmacy for the refill.) ?(If yes, when and what did the pharmacy advise?) ? ?  ? ?Preferred Pharmacy (with phone number or street name):  ?[id number] [place], [place] - [address]  ?[id number] ?Phone:  [phone] Fax:  [phone] ?  ? ?Agent: Please be advised that RX refills may take up to 3 business days. We ask that you follow-up with your pharmacy.  ?

## 2022-02-14 NOTE — Telephone Encounter (Signed)
Requesting: Valium ?Contract: 12/13/2021 ?UDS: 12/13/2021 ?Last OV: 12/13/2021 ?Next OV: N/A ?Last Refill: 01/16/2022, #90--0 RF ?Database: ? ? ?Please advise  ? ?

## 2022-03-15 ENCOUNTER — Telehealth: Payer: Self-pay | Admitting: Family Medicine

## 2022-03-15 ENCOUNTER — Other Ambulatory Visit: Payer: Self-pay | Admitting: Family Medicine

## 2022-03-15 DIAGNOSIS — G894 Chronic pain syndrome: Secondary | ICD-10-CM

## 2022-03-15 MED ORDER — TRAMADOL HCL 50 MG PO TABS: ORAL_TABLET | ORAL | 0 refills | Status: DC

## 2022-03-15 NOTE — Telephone Encounter (Signed)
Requesting: tramadol '50mg'$   ?Contract:12/13/21 ?UDS: 12/13/21 ?Last Visit: 12/13/21 ?Next Visit: None ?Last Refill: 02/13/2022 #90 and 0RF ? ?Please Advise ? ?

## 2022-03-15 NOTE — Telephone Encounter (Signed)
Medication: traMADol (ULTRAM) 50 MG tablet ? ?Has the patient contacted their pharmacy? Yes.   ? ?Preferred Pharmacy (with phone number or street name): \ ?[id number] [place], [place] - [address]  ?[id number] ?Phone:  [phone] Fax:  [phone] ? ?Agent: Please be advised that RX refills may take up to 3 business days. We ask that you follow-up with your pharmacy.  ?

## 2022-04-04 ENCOUNTER — Telehealth: Payer: Self-pay | Admitting: Family Medicine

## 2022-04-04 NOTE — Telephone Encounter (Signed)
Pt called wanting to see about getting a Cortizone shot for rotator cuff in his right shoulder. Pt was wondering if Dr. Etter Sjogren could give him a call to discuss if that would be a good option.  ?

## 2022-04-04 NOTE — Telephone Encounter (Signed)
Spoke with patient and advised he should contact [facility] since he was seen for his knee. Pt states he will call his insurance to check to who might be covered because his last shot in his knee cost him $1,000. ?

## 2022-04-10 ENCOUNTER — Other Ambulatory Visit: Payer: BC Managed Care – PPO

## 2022-04-10 ENCOUNTER — Telehealth: Payer: Self-pay | Admitting: Family Medicine

## 2022-04-10 DIAGNOSIS — Z79899 Other long term (current) drug therapy: Secondary | ICD-10-CM | POA: Diagnosis not present

## 2022-04-10 DIAGNOSIS — R39198 Other difficulties with micturition: Secondary | ICD-10-CM

## 2022-04-10 NOTE — Telephone Encounter (Signed)
Pt is requesting a referral to urology. He would rather the nurse give him a call, than come in the office to be seen. Please advise pt.  ?

## 2022-04-11 ENCOUNTER — Other Ambulatory Visit: Payer: BC Managed Care – PPO

## 2022-04-12 NOTE — Telephone Encounter (Signed)
Referral placed.

## 2022-04-12 NOTE — Telephone Encounter (Signed)
Spoke with patient. Pt states he had a PSA done in January and it was normal. He states having trouble with urination at night. Pt would like a referral for urology instead of coming into the office. Pt was advised he may need to come in for appt but states just wanting a referral.  ?

## 2022-04-12 NOTE — Telephone Encounter (Signed)
Pt called. LVM to return call regarding referral  ?

## 2022-04-14 LAB — DRUG MONITORING PANEL 375977 , URINE
Alcohol Metabolites: POSITIVE ng/mL — AB (ref <500)
Alphahydroxyalprazolam: NEGATIVE ng/mL (ref <25)
Alphahydroxymidazolam: NEGATIVE ng/mL (ref <50)
Alphahydroxytriazolam: NEGATIVE ng/mL (ref <50)
Aminoclonazepam: NEGATIVE ng/mL (ref <25)
Amphetamines: NEGATIVE ng/mL (ref <500)
Barbiturates: NEGATIVE ng/mL (ref <300)
Benzodiazepines: POSITIVE ng/mL — AB (ref <100)
Cocaine Metabolite: NEGATIVE ng/mL (ref <150)
Desmethyltramadol: 4181 ng/mL — ABNORMAL HIGH (ref <100)
Ethyl Glucuronide (ETG): 4440 ng/mL — ABNORMAL HIGH (ref <500)
Ethyl Sulfate (ETS): 1110 ng/mL — ABNORMAL HIGH (ref <100)
Hydroxyethylflurazepam: NEGATIVE ng/mL (ref <50)
Lorazepam: NEGATIVE ng/mL (ref <50)
Marijuana Metabolite: NEGATIVE ng/mL (ref <20)
Nordiazepam: 674 ng/mL — ABNORMAL HIGH (ref <50)
Opiates: NEGATIVE ng/mL (ref <100)
Oxazepam: 1973 ng/mL — ABNORMAL HIGH (ref <50)
Oxycodone: NEGATIVE ng/mL (ref <100)
Temazepam: 1290 ng/mL — ABNORMAL HIGH (ref <50)
Tramadol: 6438 ng/mL — ABNORMAL HIGH (ref <100)

## 2022-04-14 LAB — DM TEMPLATE

## 2022-04-17 ENCOUNTER — Other Ambulatory Visit: Payer: Self-pay | Admitting: Family Medicine

## 2022-04-17 DIAGNOSIS — G894 Chronic pain syndrome: Secondary | ICD-10-CM

## 2022-04-17 DIAGNOSIS — F419 Anxiety disorder, unspecified: Secondary | ICD-10-CM

## 2022-04-17 MED ORDER — TRAMADOL HCL 50 MG PO TABS: ORAL_TABLET | ORAL | 0 refills | Status: DC

## 2022-04-17 MED ORDER — DIAZEPAM 10 MG PO TABS: 10.0000 mg | ORAL_TABLET | Freq: Three times a day (TID) | ORAL | 0 refills | Status: DC

## 2022-04-17 NOTE — Telephone Encounter (Signed)
Caller: Sherlyn Hay  Pt needs rx refilled for diazepam and tramadol  [facility] [place] [id number]

## 2022-04-17 NOTE — Telephone Encounter (Signed)
Requesting: Valium Contract: 04/10/2022 UDS: 05/150/2023 Last OV: 12/13/2021 Next OV: N/A Last Refill: 02/13/2022, #90--0 Rf Database:   Requesting: Tramadol Contract: UDS: Last OV: Next OV: Last Refill: 03/16/2022, #90--0 RF Database:   Please advise

## 2022-04-28 ENCOUNTER — Encounter: Payer: Self-pay | Admitting: Family Medicine

## 2022-04-28 ENCOUNTER — Ambulatory Visit (INDEPENDENT_AMBULATORY_CARE_PROVIDER_SITE_OTHER): Payer: BC Managed Care – PPO | Admitting: Family Medicine

## 2022-04-28 VITALS — BP 140/82 | HR 75 | Temp 98.3°F | Resp 18 | Ht 70.0 in | Wt 209.6 lb

## 2022-04-28 DIAGNOSIS — I10 Essential (primary) hypertension: Secondary | ICD-10-CM | POA: Diagnosis not present

## 2022-04-28 DIAGNOSIS — R351 Nocturia: Secondary | ICD-10-CM | POA: Diagnosis not present

## 2022-04-28 LAB — POC URINALSYSI DIPSTICK (AUTOMATED)
Bilirubin, UA: NEGATIVE
Blood, UA: NEGATIVE
Glucose, UA: NEGATIVE
Ketones, UA: NEGATIVE
Leukocytes, UA: NEGATIVE
Nitrite, UA: NEGATIVE
Protein, UA: NEGATIVE
Spec Grav, UA: 1.015 (ref 1.010–1.025)
Urobilinogen, UA: 0.2 E.U./dL
pH, UA: 6 (ref 5.0–8.0)

## 2022-04-28 LAB — PSA: PSA: 3.03 ng/mL (ref 0.10–4.00)

## 2022-04-28 MED ORDER — LISINOPRIL 10 MG PO TABS: 10.0000 mg | ORAL_TABLET | Freq: Every day | ORAL | 3 refills | Status: DC

## 2022-04-28 NOTE — Assessment & Plan Note (Signed)
Poorly controlled will alter medications, encouraged DASH diet, minimize caffeine and obtain adequate sleep. Report concerning symptoms and follow up as directed and as needed Add lisinopril 10 mg daily

## 2022-04-28 NOTE — Progress Notes (Signed)
Subjective:   By signing my name below, I, [name], attest that this documentation has been prepared under the direction and in the presence of  Jason Schanz DO, 04/28/2022    Patient ID: Jason Williams, male    DOB: 06/21/1961, 61 y.o.   MRN: [id number]  Chief Complaint  Patient presents with   Urinary Frequency    Pt states having to use the bathroom a lot at night    HPI Patient is in today for office visit  Patient complains of urinary symptoms. He states that his flow is non consistent when he attempts to urinate. He states that he goes to the bathroom about 3-4 times a night. He reports associated symptoms of discomfort in the prostate area. He states that this concern has recently gotten worse during the last month. He is concerned about his PSA level. He is interested in consulting with a urologist to get examined.   PSA on 12/13/2021 results are 3.72.   Past Medical History:  Diagnosis Date   Anxiety    Arthritis    Depression    GERD (gastroesophageal reflux disease)    Heart murmur    Hyperlipidemia    Hypertension    Sleep apnea     Past Surgical History:  Procedure Laterality Date   ankle re construction  2011   bilateral   APPENDECTOMY  1981    Family History  Problem Relation Age of Onset   Esophageal cancer Mother    Heart attack Father        Died of MI at 23   Mental illness Father    Healthy Sister    Healthy Sister    Heart disease Maternal Grandfather        MI/CABG   Heart attack Maternal Grandfather    Colon cancer Neg Hx    Rectal cancer Neg Hx    Stomach cancer Neg Hx     Social History   Socioeconomic History   Marital status: Single    Spouse name: Not on file   Number of children: 1   Years of education: Not on file   Highest education level: Not on file  Occupational History   Occupation: Lobbyist: [employer]  Tobacco Use   Smoking status: Never   Smokeless tobacco: Never  Vaping Use    Vaping Use: Never used  Substance and Sexual Activity   Alcohol use: Yes    Alcohol/week: 2.0 standard drinks    Types: 2 Glasses of wine per week   Drug use: No   Sexual activity: Yes    Birth control/protection: Pill, None    Comment: 1 male  Other Topics Concern   Not on file  Social History Narrative   Not on file   Social Determinants of Health   Financial Resource Strain: Not on file  Food Insecurity: Not on file  Transportation Needs: Not on file  Physical Activity: Not on file  Stress: Not on file  Social Connections: Not on file  Intimate Partner Violence: Not on file    Outpatient Medications Prior to Visit  Medication Sig Dispense Refill   Alirocumab (PRALUENT) 150 MG/ML SOAJ Inject 1 Dose into the skin every 14 (fourteen) days. 6 mL 3   amLODipine (NORVASC) 10 MG tablet Take 1 tablet (10 mg total) by mouth daily. 90 tablet 2   aspirin 81 MG chewable tablet TAKE 1 TABLET BY MOUTH DAILY TO THIN BLOOD TO HELP TO  PREVENT HEART ATTACK AND STROKE     chlorhexidine gluconate, MEDLINE KIT, (PERIDEX) 0.12 % solution Use as directed 15 mLs in the mouth or throat 2 (two) times daily. 120 mL 0   diazepam (VALIUM) 10 MG tablet TAKE 1 TABLET BY MOUTH THREE TIMES A DAY 90 tablet 0   diazepam (VALIUM) 10 MG tablet Take 1 tablet (10 mg total) by mouth 3 (three) times daily. 90 tablet 0   lidocaine (XYLOCAINE) 2 % solution Use as directed 15 mLs in the mouth or throat every 6 (six) hours as needed for mouth pain. Gargle and spit 100 mL 0   tadalafil (CIALIS) 20 MG tablet Take 1 tablet (20 mg total) by mouth as needed. 30 tablet 0   traMADol (ULTRAM) 50 MG tablet TAKE 1 TABLET 3 TIMES A DAY AS NEEDED FOR MODERATE TO SEVERE PAIN 90 tablet 0   traMADol (ULTRAM) 50 MG tablet TAKE 1 TABLET BY MOUTH 3 TIMES A DAY AS NEEDED FOR MODERATE OR SEVERE PAIN 90 tablet 0   zolpidem (AMBIEN) 10 MG tablet TAKE 1 TABLET BY MOUTH EVERY DAY AT BEDTIME AS NEEDED FOR SLEEP 90 tablet 0   No  facility-administered medications prior to visit.    No Known Allergies  Review of Systems  Constitutional:  Negative for fever and malaise/fatigue.  HENT:  Negative for congestion.   Eyes:  Negative for blurred vision.  Respiratory:  Negative for cough and shortness of breath.   Cardiovascular:  Negative for chest pain, palpitations and leg swelling.  Gastrointestinal:  Negative for vomiting.       (+) Discomfort in Prostate Area  Genitourinary:  Positive for urgency.  Musculoskeletal:  Negative for back pain.  Skin:  Negative for rash.  Neurological:  Negative for loss of consciousness and headaches.      Objective:    Physical Exam Constitutional:      General: He is not in acute distress.    Appearance: Normal appearance. He is not ill-appearing.  HENT:     Head: Normocephalic and atraumatic.     Right Ear: External ear normal.     Left Ear: External ear normal.  Eyes:     Extraocular Movements: Extraocular movements intact.     Pupils: Pupils are equal, round, and reactive to light.  Cardiovascular:     Rate and Rhythm: Normal rate and regular rhythm.     Heart sounds: Normal heart sounds. No murmur heard.   No gallop.  Pulmonary:     Effort: Pulmonary effort is normal. No respiratory distress.     Breath sounds: Normal breath sounds. No wheezing or rales.  Skin:    General: Skin is warm and dry.  Neurological:     Mental Status: He is alert and oriented to person, place, and time.  Psychiatric:        Judgment: Judgment normal.    BP 140/82   Pulse 75   Temp 98.3 F (36.8 C) (Oral)   Resp 18   Ht _0  (1.778 m)   Wt 209 lb 9.6 oz (95.1 kg)   SpO2 95%   BMI 30.07 kg/m  Wt Readings from Last 3 Encounters:  04/28/22 209 lb 9.6 oz (95.1 kg)  12/13/21 208 lb 6.4 oz (94.5 kg)  11/10/21 205 lb (93 kg)    Diabetic Foot Exam - Simple   No data filed    Lab Results  Component Value Date   WBC 4.8 12/13/2021   HGB 15.2 12/13/2021  HCT 46.3  12/13/2021   PLT 226.0 12/13/2021   GLUCOSE 100 (H) 12/13/2021   CHOL 250 (H) 10/12/2020   TRIG 230 (H) 10/12/2020   HDL 41 10/12/2020   LDLCALC 166 (H) 10/12/2020   ALT 35 12/13/2021   AST 21 12/13/2021   NA 141 12/13/2021   K 4.4 12/13/2021   CL 105 12/13/2021   CREATININE 1.03 12/13/2021   BUN 14 12/13/2021   CO2 30 12/13/2021   TSH 3.38 12/13/2021   PSA 3.72 12/13/2021   HGBA1C 5.3 01/25/2015    Lab Results  Component Value Date   TSH 3.38 12/13/2021   Lab Results  Component Value Date   WBC 4.8 12/13/2021   HGB 15.2 12/13/2021   HCT 46.3 12/13/2021   MCV 92.4 12/13/2021   PLT 226.0 12/13/2021   Lab Results  Component Value Date   NA 141 12/13/2021   K 4.4 12/13/2021   CO2 30 12/13/2021   GLUCOSE 100 (H) 12/13/2021   BUN 14 12/13/2021   CREATININE 1.03 12/13/2021   BILITOT 0.5 12/13/2021   ALKPHOS 53 12/13/2021   AST 21 12/13/2021   ALT 35 12/13/2021   PROT 7.2 12/13/2021   ALBUMIN 4.5 12/13/2021   CALCIUM 9.3 12/13/2021   EGFR 85 08/22/2021   GFR 78.93 12/13/2021   Lab Results  Component Value Date   CHOL 250 (H) 10/12/2020   Lab Results  Component Value Date   HDL 41 10/12/2020   Lab Results  Component Value Date   LDLCALC 166 (H) 10/12/2020   Lab Results  Component Value Date   TRIG 230 (H) 10/12/2020   Lab Results  Component Value Date   CHOLHDL 6.1 (H) 10/12/2020   Lab Results  Component Value Date   HGBA1C 5.3 01/25/2015       Assessment & Plan:   Problem List Items Addressed This Visit       Unprioritized   Primary hypertension    Poorly controlled will alter medications, encouraged DASH diet, minimize caffeine and obtain adequate sleep. Report concerning symptoms and follow up as directed and as needed Add lisinopril 10 mg daily        Relevant Medications   lisinopril (ZESTRIL) 10 MG tablet   Excessive urination at night - Primary    Pt preferred no prostate exam here--- he'd rather wait until he sees urology        Relevant Orders   POCT Urinalysis Dipstick (Automated) (Completed)   PSA   Ambulatory referral to Urology     Meds ordered this encounter  Medications   lisinopril (ZESTRIL) 10 MG tablet    Sig: Take 1 tablet (10 mg total) by mouth daily.    Dispense:  30 tablet    Refill:  3    I, [name], DO, personally preformed the services described in this documentation.  All medical record entries made by the scribe were at my direction and in my presence.  I have reviewed the chart and discharge instructions (if applicable) and agree that the record reflects my personal performance and is accurate and complete. 04/28/2022   I,Amber Collins,acting as a scribe for [name], DO.,have documented all relevant documentation on the behalf of [name], DO,as directed by  [name], DO while in the presence of [name], DO.    [name], DO

## 2022-04-28 NOTE — Assessment & Plan Note (Signed)
Pt preferred no prostate exam here--- he'd rather wait until he sees urology

## 2022-04-28 NOTE — Patient Instructions (Signed)

## 2022-05-18 ENCOUNTER — Other Ambulatory Visit: Payer: Self-pay

## 2022-05-18 DIAGNOSIS — F419 Anxiety disorder, unspecified: Secondary | ICD-10-CM

## 2022-05-18 DIAGNOSIS — N529 Male erectile dysfunction, unspecified: Secondary | ICD-10-CM

## 2022-05-18 DIAGNOSIS — G894 Chronic pain syndrome: Secondary | ICD-10-CM

## 2022-05-18 MED ORDER — TADALAFIL 20 MG PO TABS: 20.0000 mg | ORAL_TABLET | ORAL | 3 refills | Status: DC | PRN

## 2022-05-18 NOTE — Telephone Encounter (Signed)
Requesting: tramadol 50mg  and diazepam 10mg  Contract: 12/13/21 UDS: 04/10/22 Last Visit: 04/28/22 Next Visit: None Last Refill on tramadol: 04/17/22 #90 and 0RF Last Refill on diazepam: 04/17/22 #90 and 0RF  Please Advise

## 2022-05-19 MED ORDER — TRAMADOL HCL 50 MG PO TABS: ORAL_TABLET | ORAL | 0 refills | Status: DC

## 2022-05-19 MED ORDER — DIAZEPAM 10 MG PO TABS: 10.0000 mg | ORAL_TABLET | Freq: Three times a day (TID) | ORAL | 0 refills | Status: DC

## 2022-05-23 ENCOUNTER — Telehealth: Payer: Self-pay | Admitting: Internal Medicine

## 2022-05-23 ENCOUNTER — Other Ambulatory Visit: Payer: Self-pay | Admitting: Family Medicine

## 2022-05-23 DIAGNOSIS — I10 Essential (primary) hypertension: Secondary | ICD-10-CM

## 2022-05-29 NOTE — Telephone Encounter (Signed)
PA for repatha submitted via CMM (Key: [id number]) - [id number]

## 2022-06-01 ENCOUNTER — Other Ambulatory Visit: Payer: Self-pay

## 2022-06-01 ENCOUNTER — Telehealth: Payer: Self-pay | Admitting: Internal Medicine

## 2022-06-01 DIAGNOSIS — I10 Essential (primary) hypertension: Secondary | ICD-10-CM

## 2022-06-01 MED ORDER — AMLODIPINE BESYLATE 10 MG PO TABS: 10.0000 mg | ORAL_TABLET | Freq: Every day | ORAL | 3 refills | Status: DC

## 2022-06-01 MED ORDER — REPATHA SURECLICK 140 MG/ML ~~LOC~~ SOAJ: 1.0000 | SUBCUTANEOUS | 3 refills | Status: DC

## 2022-06-01 NOTE — Telephone Encounter (Signed)
Patient stated that on 04/28/22, his pcp took him off amlodipine 10 mg daily. and placed him on lisinopril 10 mg daily. When I looked in Epic, I informed patient that PCP did not d/c amlodipine and added lisinopril. Patient stated he look on the Internet and did not like the cancer risk for taking lisinopril. He has not advised his PCP and wanted Dr. Debara Pickett to prescribe medication for his BP. Patietn denies chest pain, headache, dizziness, sob. Please advise on BP meds.

## 2022-06-01 NOTE — Addendum Note (Signed)
Addended by: Fidel Levy on: 06/01/2022 01:44 PM   Modules accepted: Orders

## 2022-06-01 NOTE — Telephone Encounter (Signed)
Informed patient of Dr. Lysbeth Penner reply... "He should take lisinopril and amlodipine if the blood pressure is not well-controlled. Lisinopril does not cause cancer - there are from time to time impurities that are noted from manufacturing that are associated with recalls - this has affected lisinopril and the ACE-inhibitors as well as the ARB's. Unless it is recalled, it is safe to take.  Dr. Debara Pickett" Patient asked for refill for amlodipine '10mg'$  daily. Done.

## 2022-06-01 NOTE — Telephone Encounter (Signed)
He should take lisinopril and amlodipine if the blood pressure is not well-controlled. Lisinopril does not cause cancer - there are from time to time impurities that are noted from manufacturing that are associated with recalls - this has affected lisinopril and the ACE-inhibitors as well as the ARB's. Unless it is recalled, it is safe to take.  Dr. Debara Pickett

## 2022-06-01 NOTE — Telephone Encounter (Signed)
Pt c/o medication issue:  1. Name of Medication:   lisinopril (ZESTRIL) 10 MG tablet    2. How are you currently taking this medication (dosage and times per day)? As written   3. Are you having a reaction (difficulty breathing--STAT)? no  4. What is your medication issue? Pt called stating his pcp prescribed this medication and he did some research on it and it has a lot of bad side effects. He would like to know Dr [facility] opinion

## 2022-06-01 NOTE — Telephone Encounter (Signed)
Repatha is authorized until 08/24/2022 -- same date as end date for Praluent PA. Rx(s) sent to pharmacy electronically.

## 2022-06-14 ENCOUNTER — Telehealth (INDEPENDENT_AMBULATORY_CARE_PROVIDER_SITE_OTHER): Payer: BC Managed Care – PPO | Admitting: Medical

## 2022-06-14 ENCOUNTER — Encounter: Payer: Self-pay | Admitting: Medical

## 2022-06-14 VITALS — HR 99

## 2022-06-14 DIAGNOSIS — G894 Chronic pain syndrome: Secondary | ICD-10-CM

## 2022-06-14 DIAGNOSIS — F419 Anxiety disorder, unspecified: Secondary | ICD-10-CM

## 2022-06-14 DIAGNOSIS — U071 COVID-19: Secondary | ICD-10-CM | POA: Diagnosis not present

## 2022-06-14 MED ORDER — TRAMADOL HCL 50 MG PO TABS: ORAL_TABLET | ORAL | 0 refills | Status: DC

## 2022-06-14 MED ORDER — BENZONATATE 100 MG PO CAPS: 100.0000 mg | ORAL_CAPSULE | Freq: Three times a day (TID) | ORAL | 0 refills | Status: DC | PRN

## 2022-06-14 MED ORDER — AZITHROMYCIN 250 MG PO TABS
ORAL_TABLET | ORAL | 0 refills | Status: AC
Start: 2022-06-14 — End: 2022-06-19

## 2022-06-14 MED ORDER — DIAZEPAM 10 MG PO TABS: 10.0000 mg | ORAL_TABLET | Freq: Three times a day (TID) | ORAL | 0 refills | Status: DC

## 2022-06-14 MED ORDER — NIRMATRELVIR/RITONAVIR (PAXLOVID)TABLET: 3.0000 | ORAL_TABLET | Freq: Two times a day (BID) | ORAL | 0 refills | Status: AC

## 2022-06-14 MED ORDER — FLUTICASONE PROPIONATE 50 MCG/ACT NA SUSP: 2.0000 | Freq: Every day | NASAL | 1 refills | Status: DC

## 2022-06-14 NOTE — Patient Instructions (Signed)
COVID infection with moderate severe symptoms and symptom onset x1 day.  Vaccinated twice in the past and severe COVID infection that lasted 1 month about 1 year ago.  Rest, hydrate and can use low-dose ibuprofen if checking blood pressure daily and knowing that blood pressure not elevated as discussed.  Prescribing benzonatate for cough and Flonase for nasal congestion.  Hypertension but I do not know her BP reading today.  I did talk with her pharmacist and there is potential significant interaction with Paxlovid and amlodipine.  Amlodipine concentrations can be increased.  Since she had severe illness last year do want to write Paxlovid but need to do so carefully.  Please check your blood pressure today and if blood pressure recently controlled you could take 5 mg every other day.  Would asked that you check your blood pressure daily while using Paxlovid.  If BP over 150/90 let me know.  Since do describe thick productive mucus decided to go ahead and prescribe azithromycin antibiotic.  Discussed on how to end quarantine.  Check O2 sats daily and notify me of any saturations less than 96%.  Follow-up in 1 week or sooner if needed.

## 2022-06-14 NOTE — Progress Notes (Signed)
   Subjective:    Patient ID: Jason Williams, male    DOB: 1961-10-25, 61 y.o.   MRN: [id number]  HPI Virtual Visit via Video Note  I connected with Jason Williams on 06/14/22 at  9:20 AM EDT by a video enabled telemedicine application and verified that I am speaking with the correct person using two identifiers.  Location: Patient: home Provider: office   I discussed the limitations of evaluation and management by telemedicine and the availability of in person appointments. The patient expressed understanding and agreed to proceed.  History of Present Illness:   Pt recently diagnosed with covid on   Test used-otc test.  History of covid vaccine x 1.   History of covid infection- twice with [facility]  Current symptoms-pt symptoms started last night. Felt exhausted. He was coughing up mucus last night and has severe st. He states coughing up chunks of mucus. Chills and some body aches. He is sore. No   Pt 02 sat-98%  Pt states last year had covid in May and he was sick for about one month.   Pt does not have bp reading today. Pt is on amlodipine and states last year was on paxlovid. Increases concentration of amlodipine.   Observations/Objective:  General-no acute distress, pleasant, oriented. Lungs- on inspection lungs appear unlabored. Neck- no tracheal deviation or jvd on inspection. Neuro- gross motor function appears intact.   Assessment and Plan:  Patient Instructions  COVID infection with moderate severe symptoms and symptom onset x1 day.  Vaccinated twice in the past and severe COVID infection that lasted 1 month about 1 year ago.  Rest, hydrate and can use low-dose ibuprofen if checking blood pressure daily and knowing that blood pressure not elevated as discussed.  Prescribing benzonatate for cough and Flonase for nasal congestion.  Hypertension but I do not know her BP reading today.  I did talk with her pharmacist and there is potential significant  interaction with Paxlovid and amlodipine.  Amlodipine concentrations can be increased.  Since she had severe illness last year do want to write Paxlovid but need to do so carefully.  Please check your blood pressure today and if blood pressure recently controlled you could take 5 mg every other day.  Would asked that you check your blood pressure daily while using Paxlovid.  If BP over 150/90 let me know.  Since do describe thick productive mucus decided to go ahead and prescribe azithromycin antibiotic.  Discussed on how to end quarantine.  Check O2 sats daily and notify me of any saturations less than 96%.  Follow-up in 1 week or sooner if needed.    Patient is on tramadol and Valium regular basis.  As a courtesy I did send refills as he will soon be due. Follow Up Instructions:    I discussed the assessment and treatment plan with the patient. The patient was provided an opportunity to ask questions and all were answered. The patient agreed with the plan and demonstrated an understanding of the instructions.   The patient was advised to call back or seek an in-person evaluation if the symptoms worsen or if the condition fails to improve as anticipated.     Mackie Pai, PA-C    Review of Systems     Objective:   Physical Exam        Assessment & Plan:

## 2022-07-06 ENCOUNTER — Other Ambulatory Visit: Payer: Self-pay | Admitting: Medical

## 2022-07-17 ENCOUNTER — Other Ambulatory Visit: Payer: Self-pay | Admitting: Medical

## 2022-07-17 ENCOUNTER — Other Ambulatory Visit: Payer: Self-pay | Admitting: Family Medicine

## 2022-07-17 DIAGNOSIS — F419 Anxiety disorder, unspecified: Secondary | ICD-10-CM

## 2022-07-17 DIAGNOSIS — G894 Chronic pain syndrome: Secondary | ICD-10-CM

## 2022-07-17 NOTE — Telephone Encounter (Signed)
Requesting: diazepam '10mg'$   Contract: 12/13/21 UDS: 04/10/22 Last Visit: 06/14/22 w/ Percell Miller  Next Visit: None Last Refill: 06/14/22 #90 and 0RF  Please Advise

## 2022-07-17 NOTE — Telephone Encounter (Signed)
Medication:   traMADol (ULTRAM) 50 MG tablet [[id number]]   Has the patient contacted their pharmacy? No. (If no, request that the patient contact the pharmacy for the refill.) (If yes, when and what did the pharmacy advise?)  Preferred Pharmacy (with phone number or street name):   [id number] [place] [address] [place] [address] Phone:  [phone] Fax:  [phone]  Agent: Please be advised that RX refills may take up to 3 business days. We ask that you follow-up with your pharmacy.

## 2022-07-18 ENCOUNTER — Other Ambulatory Visit: Payer: Self-pay | Admitting: Medical

## 2022-07-18 DIAGNOSIS — G894 Chronic pain syndrome: Secondary | ICD-10-CM

## 2022-07-18 NOTE — Telephone Encounter (Signed)
Requesting: Tramadol Contract: 04/10/2022 UDS: 04/10/2022 Last OV: 06/14/2022 vv w/ Saguier Next OV: N/A Last Refill: 06/14/2022, #90-0 RF Database:   Please advise

## 2022-07-18 NOTE — Telephone Encounter (Signed)
Requesting: tramadol '50mg'$   Contract: 12/13/21 UDS: 04/10/22 Last Visit: 06/14/22 Next Visit: None Last Refill: 06/14/22 #90 and 0RF  Please Advise

## 2022-07-19 ENCOUNTER — Other Ambulatory Visit: Payer: Self-pay | Admitting: Medical

## 2022-07-19 DIAGNOSIS — G894 Chronic pain syndrome: Secondary | ICD-10-CM

## 2022-07-20 NOTE — Telephone Encounter (Signed)
Requesting: tramadol '50mg'$   Contract: 12/13/21 UDS: 04/10/22 Last Visit: 04/28/22 Next Visit: None Last Refill: 06/14/22 #90 and 0RF  Please Advise

## 2022-07-24 ENCOUNTER — Other Ambulatory Visit: Payer: Self-pay | Admitting: Family Medicine

## 2022-07-24 DIAGNOSIS — I10 Essential (primary) hypertension: Secondary | ICD-10-CM

## 2022-08-02 ENCOUNTER — Other Ambulatory Visit: Payer: Self-pay | Admitting: Medical

## 2022-08-18 ENCOUNTER — Other Ambulatory Visit: Payer: Self-pay | Admitting: Family Medicine

## 2022-08-18 DIAGNOSIS — I10 Essential (primary) hypertension: Secondary | ICD-10-CM

## 2022-08-21 ENCOUNTER — Other Ambulatory Visit: Payer: Self-pay | Admitting: Medical

## 2022-08-21 ENCOUNTER — Telehealth: Payer: Self-pay | Admitting: Family Medicine

## 2022-08-21 ENCOUNTER — Other Ambulatory Visit: Payer: Self-pay | Admitting: Family Medicine

## 2022-08-21 DIAGNOSIS — G894 Chronic pain syndrome: Secondary | ICD-10-CM

## 2022-08-21 DIAGNOSIS — F419 Anxiety disorder, unspecified: Secondary | ICD-10-CM

## 2022-08-21 MED ORDER — TRAMADOL HCL 50 MG PO TABS: ORAL_TABLET | ORAL | 0 refills | Status: DC

## 2022-08-21 MED ORDER — DIAZEPAM 10 MG PO TABS: 10.0000 mg | ORAL_TABLET | Freq: Three times a day (TID) | ORAL | 0 refills | Status: DC

## 2022-08-21 NOTE — Telephone Encounter (Signed)
Requesting: tramadol '50mg'$  and diazepam '10mg'$   Contract: 12/13/21 UDS: 04/10/22 Last Visit: 04/28/22 Next Visit: None Last Refill on tramadol: 07/20/22 #90 and 0RF Last Refill on diazepam: 07/17/22 #90 and 0RF  Please Advise

## 2022-08-21 NOTE — Telephone Encounter (Signed)
Medication:  1.diazepam (VALIUM) 10 MG tablet  2.traMADol (ULTRAM) 50 MG tablet  Has the patient contacted their pharmacy? Yes.     Preferred Pharmacy (with phone number or street name):  [id number] [place] [address] [place] Phone:  [phone] Fax:  [phone]

## 2022-08-21 NOTE — Telephone Encounter (Signed)
Pt stated pharmacy is needing diagnosis code. Nira Conn confirmed this was already in the system and will call the pharmacy to see what happened.

## 2022-08-21 NOTE — Telephone Encounter (Signed)
Requesting: Valium Contract: 04/10/2022 UDS: 04/10/2022 Last OV: 06/14/2022-virtual visit Next OV: N/A Last Refill: 07/17/2022, #90--0 RF Database:   Requesting: Tramadol Contract: UDS: Last OV: Next OV: Last Refill: 07/20/2022, #90--0 RF Database:   Please advise

## 2022-08-22 NOTE — Telephone Encounter (Signed)
Attempted to contact pharmacy 3 times and unable to speak with anyone.

## 2022-08-22 NOTE — Telephone Encounter (Signed)
Attempted to call pharmacy. Pharmacy not open yet

## 2022-08-22 NOTE — Telephone Encounter (Signed)
Spoke with RPH and pt has picked up both rx's.

## 2022-08-30 ENCOUNTER — Other Ambulatory Visit: Payer: Self-pay | Admitting: Family Medicine

## 2022-08-30 DIAGNOSIS — G4700 Insomnia, unspecified: Secondary | ICD-10-CM

## 2022-08-30 NOTE — Telephone Encounter (Signed)
Medication: zolpidem (AMBIEN) 10 MG tablet   Has the patient contacted their pharmacy? No.  Preferred Pharmacy (with phone number or street name):  [id number] [place], [place] [place]Phone: [phone] Fax: [phone]

## 2022-08-30 NOTE — Telephone Encounter (Signed)
Requesting: Ambien Contract: 04/10/2022 UDS: 04/10/2022 Last OV: 06/14/2022--VV  Next OV: N/A Last Refill: 02/13/2022, #90--0 RF Database:   Please advise

## 2022-08-31 MED ORDER — ZOLPIDEM TARTRATE 10 MG PO TABS: ORAL_TABLET | ORAL | 0 refills | Status: DC

## 2022-09-14 ENCOUNTER — Other Ambulatory Visit: Payer: Self-pay | Admitting: *Deleted

## 2022-09-14 DIAGNOSIS — E785 Hyperlipidemia, unspecified: Secondary | ICD-10-CM

## 2022-09-18 ENCOUNTER — Telehealth: Payer: Self-pay | Admitting: Pharmacist

## 2022-09-18 NOTE — Telephone Encounter (Signed)
PA renewal for Repatha started Key: [id number] Waiting on questions

## 2022-09-19 NOTE — Telephone Encounter (Signed)
PA approved through 09/19/23

## 2022-09-20 ENCOUNTER — Other Ambulatory Visit: Payer: Self-pay | Admitting: Family Medicine

## 2022-09-20 DIAGNOSIS — G894 Chronic pain syndrome: Secondary | ICD-10-CM

## 2022-09-20 DIAGNOSIS — F419 Anxiety disorder, unspecified: Secondary | ICD-10-CM

## 2022-09-21 ENCOUNTER — Other Ambulatory Visit: Payer: Self-pay | Admitting: Family Medicine

## 2022-09-21 DIAGNOSIS — G894 Chronic pain syndrome: Secondary | ICD-10-CM

## 2022-09-21 DIAGNOSIS — G4700 Insomnia, unspecified: Secondary | ICD-10-CM

## 2022-09-21 MED ORDER — ZOLPIDEM TARTRATE 10 MG PO TABS: ORAL_TABLET | ORAL | 0 refills | Status: DC

## 2022-09-21 MED ORDER — TRAMADOL HCL 50 MG PO TABS: ORAL_TABLET | ORAL | 0 refills | Status: DC

## 2022-09-21 NOTE — Telephone Encounter (Signed)
Requesting: diazepam '10mg'$  and tramadol '50mg'$   Contract: 12/13/21 UDS: 04/10/22 Last Visit: 04/28/22 Next Visit: None Last Refill on diazepam: 08/21/22 #90 and 0RF Last Refill on tramadol: 08/21/22 #90 and 0RF  Please Advise

## 2022-09-21 NOTE — Telephone Encounter (Signed)
Medication: traMADol (ULTRAM) 50 MG tablet  diazepam (VALIUM) 10 MG tablet  Has the patient contacted their pharmacy? Yes.     Preferred [facility]acy:     [id number] [place], [address] [place] [address]Phone: [phone] Fax: [phone]

## 2022-09-21 NOTE — Telephone Encounter (Signed)
Requesting: Tramadol Contract: 04/10/2022 UDS: 04/10/2022 Last OV: 06/14/2022, VV w/ Saguier Next OV: N/A Last Refill: 08/21/2022 Database:   Requesting: Ambien Contract: UDS: Last OV: Next OV: Last Refill: 08/31/2022, #90--0 RF Database:   Please advise

## 2022-09-22 ENCOUNTER — Telehealth: Payer: Self-pay | Admitting: Family Medicine

## 2022-09-22 ENCOUNTER — Other Ambulatory Visit: Payer: Self-pay | Admitting: Family Medicine

## 2022-09-22 DIAGNOSIS — F419 Anxiety disorder, unspecified: Secondary | ICD-10-CM

## 2022-09-22 MED ORDER — DIAZEPAM 10 MG PO TABS: 10.0000 mg | ORAL_TABLET | Freq: Three times a day (TID) | ORAL | 0 refills | Status: DC

## 2022-09-22 NOTE — Telephone Encounter (Signed)
Pt was requesting valium not the Ambien. Please advise pt once fixed.    [id number] [place],  - [address] [place] [id number]: [phone] Fax: [phone]

## 2022-09-22 NOTE — Telephone Encounter (Signed)
Requesting: Valium Contract: 04/10/2022 UDS: 04/10/2022 Last OV: 06/14/2022 Next OV: N/A Last Refill: 08/21/2022, #90--0 RF Database:   Please advise

## 2022-09-22 NOTE — Telephone Encounter (Signed)
Done. Pharmacy was closed at the time of the call. VM left on provider vm

## 2022-10-09 ENCOUNTER — Telehealth: Payer: Self-pay | Admitting: Family Medicine

## 2022-10-09 DIAGNOSIS — N529 Male erectile dysfunction, unspecified: Secondary | ICD-10-CM

## 2022-10-09 MED ORDER — TADALAFIL 20 MG PO TABS: 20.0000 mg | ORAL_TABLET | ORAL | 3 refills | Status: DC | PRN

## 2022-10-09 NOTE — Telephone Encounter (Signed)
Medication:  tadalafil (CIALIS) 20 MG tablet   Has the patient contacted their pharmacy? No.  Preferred Pharmacy (with phone number or street name):  [facility], [place] [address] Phone: [phone]  Fax: [phone]

## 2022-10-09 NOTE — Telephone Encounter (Signed)
Rx sent in and appt for follow up made.

## 2022-10-23 ENCOUNTER — Other Ambulatory Visit: Payer: Self-pay | Admitting: Family Medicine

## 2022-10-23 DIAGNOSIS — F419 Anxiety disorder, unspecified: Secondary | ICD-10-CM

## 2022-10-23 DIAGNOSIS — G894 Chronic pain syndrome: Secondary | ICD-10-CM

## 2022-10-23 MED ORDER — DIAZEPAM 10 MG PO TABS: 10.0000 mg | ORAL_TABLET | Freq: Three times a day (TID) | ORAL | 0 refills | Status: DC

## 2022-10-23 MED ORDER — TRAMADOL HCL 50 MG PO TABS: ORAL_TABLET | ORAL | 0 refills | Status: DC

## 2022-10-23 NOTE — Telephone Encounter (Signed)
Requesting: Tramadol Contract: 04/10/2022 UDS: 04/10/2022 Last OV: 06/14/2022---vv-COVID Next OV: 11/09/2022 Last Refill: 09/21/2022, #90--0 RF Database:   Requesting: Valium Contract: UDS: Last OV: Next OV: Last Refill: 09/22/2022, #90--0 RF Database:   Please advise

## 2022-10-23 NOTE — Telephone Encounter (Signed)
Medication:   traMADol (ULTRAM) 50 MG tablet [[id number]]   diazepam (VALIUM) 10 MG tablet [[id number]]   Has the patient contacted their pharmacy? No. (If no, request that the patient contact the pharmacy for the refill.) (If yes, when and what did the pharmacy advise?)  Preferred Pharmacy (with phone number or street name):   [id number] [place], [address] [address] [place] [address]Phone: [phone] Fax: [phone] Agent: Please be advised that RX refills may take up to 3 business days. We ask that you follow-up with your pharmacy.

## 2022-11-09 ENCOUNTER — Encounter: Payer: Self-pay | Admitting: Family Medicine

## 2022-11-09 ENCOUNTER — Ambulatory Visit (INDEPENDENT_AMBULATORY_CARE_PROVIDER_SITE_OTHER): Payer: BC Managed Care – PPO | Admitting: Family Medicine

## 2022-11-09 VITALS — BP 142/100 | HR 80 | Temp 97.6°F | Resp 18 | Ht 70.0 in | Wt 209.2 lb

## 2022-11-09 DIAGNOSIS — I10 Essential (primary) hypertension: Secondary | ICD-10-CM

## 2022-11-09 DIAGNOSIS — F419 Anxiety disorder, unspecified: Secondary | ICD-10-CM | POA: Diagnosis not present

## 2022-11-09 DIAGNOSIS — D229 Melanocytic nevi, unspecified: Secondary | ICD-10-CM | POA: Diagnosis not present

## 2022-11-09 DIAGNOSIS — Z23 Encounter for immunization: Secondary | ICD-10-CM

## 2022-11-09 DIAGNOSIS — E785 Hyperlipidemia, unspecified: Secondary | ICD-10-CM | POA: Diagnosis not present

## 2022-11-09 DIAGNOSIS — G894 Chronic pain syndrome: Secondary | ICD-10-CM | POA: Diagnosis not present

## 2022-11-09 LAB — CBC WITH DIFFERENTIAL/PLATELET
Basophils Absolute: 0 10*3/uL (ref 0.0–0.1)
Basophils Relative: 0.2 % (ref 0.0–3.0)
Eosinophils Absolute: 0.1 10*3/uL (ref 0.0–0.7)
Eosinophils Relative: 1.5 % (ref 0.0–5.0)
HCT: 44.7 % (ref 39.0–52.0)
Hemoglobin: 15.5 g/dL (ref 13.0–17.0)
Lymphocytes Relative: 22.1 % (ref 12.0–46.0)
Lymphs Abs: 1.3 10*3/uL (ref 0.7–4.0)
MCHC: 34.7 g/dL (ref 30.0–36.0)
MCV: 92.1 fl (ref 78.0–100.0)
Monocytes Absolute: 0.6 10*3/uL (ref 0.1–1.0)
Monocytes Relative: 9.3 % (ref 3.0–12.0)
Neutro Abs: 4 10*3/uL (ref 1.4–7.7)
Neutrophils Relative %: 66.9 % (ref 43.0–77.0)
Platelets: 225 10*3/uL (ref 150.0–400.0)
RBC: 4.86 Mil/uL (ref 4.22–5.81)
RDW: 13.3 % (ref 11.5–15.5)
WBC: 5.9 10*3/uL (ref 4.0–10.5)

## 2022-11-09 LAB — LIPID PANEL
Cholesterol: 113 mg/dL (ref 0–200)
HDL: 39.5 mg/dL (ref >39.00)
LDL Cholesterol: 49 mg/dL (ref 0–99)
NonHDL: 73.84
Total CHOL/HDL Ratio: 3
Triglycerides: 122 mg/dL (ref 0.0–149.0)
VLDL: 24.4 mg/dL (ref 0.0–40.0)

## 2022-11-09 LAB — COMPREHENSIVE METABOLIC PANEL
ALT: 29 U/L (ref 0–53)
AST: 17 U/L (ref 0–37)
Albumin: 4.5 g/dL (ref 3.5–5.2)
Alkaline Phosphatase: 49 U/L (ref 39–117)
BUN: 20 mg/dL (ref 6–23)
CO2: 28 mEq/L (ref 19–32)
Calcium: 9.4 mg/dL (ref 8.4–10.5)
Chloride: 106 mEq/L (ref 96–112)
Creatinine, Ser: 0.96 mg/dL (ref 0.40–1.50)
GFR: 85.34 mL/min (ref >60.00)
Glucose, Bld: 101 mg/dL — ABNORMAL HIGH (ref 70–99)
Potassium: 4.6 mEq/L (ref 3.5–5.1)
Sodium: 140 mEq/L (ref 135–145)
Total Bilirubin: 0.5 mg/dL (ref 0.2–1.2)
Total Protein: 7 g/dL (ref 6.0–8.3)

## 2022-11-09 MED ORDER — DIAZEPAM 10 MG PO TABS: 10.0000 mg | ORAL_TABLET | Freq: Three times a day (TID) | ORAL | 0 refills | Status: DC

## 2022-11-09 MED ORDER — TRAMADOL HCL 50 MG PO TABS: ORAL_TABLET | ORAL | 0 refills | Status: DC

## 2022-11-09 NOTE — Assessment & Plan Note (Signed)
Refer to dermatology 

## 2022-11-09 NOTE — Assessment & Plan Note (Addendum)
Well controlled, no changes to meds. Encouraged heart healthy diet such as the DASH diet and exercise as tolerated.   Elevated today-- pt forgot to take his medication

## 2022-11-09 NOTE — Progress Notes (Addendum)
Subjective:   By signing my name below, I, Jason Williams, attest that this documentation has been prepared under the direction and in the presence of [name], DO. 11/09/2022   Patient ID: Jason Williams, male    DOB: [birth year], 61 y.o.   MRN: [id number]  Chief Complaint  Patient presents with   Hypertension   Anxiety   Follow-up    Hypertension Associated symptoms include anxiety. Pertinent negatives include no blurred vision, chest pain, headaches, malaise/fatigue, palpitations or shortness of breath.  Anxiety Symptoms include nervous/anxious behavior. Patient reports no chest pain, palpitations, shortness of breath or suicidal ideas.     Patient is in today for a follow up visit.   He complains of sinus drainage while waking up and mild throat pain for the past couple of years. He also has a constant dry cough due to this. He has tried nasal spray and found no relief. He has tried OTC allergy medication but felt too jittery while taking it so he stopped. His cough keeps him from sleeping at night and wakes him up.  He continues complaining of knee pain. He is following up with a knee specialist and finds no relief. He was receiving an injection in his knee and fond relief for 2 weeks it quickly wore off after. He is using a tens device on his knee every night for the past week.  He reports of a raised spot on his left forearm. He has never seen a dermatologist in the past.  His blood pressure is elevated during this visit. He continues taking 10 mg amlodipine daily PO, 10 mg lisinopril daily PO and reports no new issues while taking them. BP Readings from Last 3 Encounters:  11/09/22 (!) 142/100  04/28/22 140/82  12/13/21 (!) 132/98   Pulse Readings from Last 3 Encounters:  11/09/22 80  06/14/22 99  04/28/22 75   He continues taking 140 mg repatha and reports no new issues while taking it. He continues following up with his cardiologist who is managing his  cholesterol.  Lab Results  Component Value Date   CHOL 250 (H) 10/12/2020   HDL 41 10/12/2020   LDLCALC 166 (H) 10/12/2020   TRIG 230 (H) 10/12/2020   CHOLHDL 6.1 (H) 10/12/2020   He is requesting a refill for 10 mg diazepam and 50 mg tramadol.  He is interested in receiving the first dose of the shingles vaccine during this visit. He is not interested in receiving the latest Covid-19 booster vaccine.    Past Medical History:  Diagnosis Date   Anxiety    Arthritis    Depression    GERD (gastroesophageal reflux disease)    Heart murmur    Hyperlipidemia    Hypertension    Sleep apnea     Past Surgical History:  Procedure Laterality Date   ankle re construction  2011   bilateral   APPENDECTOMY  1981    Family History  Problem Relation Age of Onset   Esophageal cancer Mother    Heart attack Father        Died of MI at 5   Mental illness Father    Healthy Sister    Healthy Sister    Heart disease Maternal Grandfather        MI/CABG   Heart attack Maternal Grandfather    Colon cancer Neg Hx    Rectal cancer Neg Hx    Stomach cancer Neg Hx     Social  History   Socioeconomic History   Marital status: Single    Spouse name: Not on file   Number of children: 1   Years of education: Not on file   Highest education level: Not on file  Occupational History   Occupation: Lobbyist: [employer]  Tobacco Use   Smoking status: Never   Smokeless tobacco: Never  Vaping Use   Vaping Use: Never used  Substance and Sexual Activity   Alcohol use: Yes    Alcohol/week: 2.0 standard drinks of alcohol    Types: 2 Glasses of wine per week   Drug use: No   Sexual activity: Yes    Birth control/protection: Pill, None    Comment: 1 male  Other Topics Concern   Not on file  Social History Narrative   Not on file   Social Determinants of Health   Financial Resource Strain: Not on file  Food Insecurity: Not on file  Transportation Needs: Not on file   Physical Activity: Not on file  Stress: Not on file  Social Connections: Not on file  Intimate Partner Violence: Not on file    Outpatient Medications Prior to Visit  Medication Sig Dispense Refill   amLODipine (NORVASC) 10 MG tablet Take 1 tablet (10 mg total) by mouth daily. 90 tablet 3   aspirin 81 MG chewable tablet TAKE 1 TABLET BY MOUTH DAILY TO THIN BLOOD TO HELP TO PREVENT HEART ATTACK AND STROKE     benzonatate (TESSALON) 100 MG capsule Take 1 capsule (100 mg total) by mouth 3 (three) times daily as needed for cough. 30 capsule 0   chlorhexidine gluconate, MEDLINE KIT, (PERIDEX) 0.12 % solution Use as directed 15 mLs in the mouth or throat 2 (two) times daily. 120 mL 0   Evolocumab (REPATHA SURECLICK) 914 MG/ML SOAJ Inject 1 Dose into the skin every 14 (fourteen) days. 6 mL 3   fluticasone (FLONASE) 50 MCG/ACT nasal spray SPRAY 2 SPRAYS INTO EACH NOSTRIL EVERY DAY 48 mL 1   lidocaine (XYLOCAINE) 2 % solution Use as directed 15 mLs in the mouth or throat every 6 (six) hours as needed for mouth pain. Gargle and spit 100 mL 0   lisinopril (ZESTRIL) 10 MG tablet TAKE 1 TABLET BY MOUTH EVERY DAY 90 tablet 0   tadalafil (CIALIS) 20 MG tablet Take 1 tablet (20 mg total) by mouth as needed. 30 tablet 3   zolpidem (AMBIEN) 10 MG tablet TAKE 1 TABLET BY MOUTH EVERY DAY AT BEDTIME AS NEEDED FOR SLEEP 90 tablet 0   diazepam (VALIUM) 10 MG tablet Take 1 tablet (10 mg total) by mouth 3 (three) times daily. 90 tablet 0   traMADol (ULTRAM) 50 MG tablet 1 po q8h prn 90 tablet 0   No facility-administered medications prior to visit.    No Known Allergies  Review of Systems  Constitutional:  Negative for fever and malaise/fatigue.  HENT:  Negative for congestion.   Eyes:  Negative for blurred vision.  Respiratory:  Negative for cough and shortness of breath.   Cardiovascular:  Negative for chest pain, palpitations and leg swelling.  Gastrointestinal:  Negative for vomiting.  Musculoskeletal:   Negative for back pain.  Skin:  Negative for rash.  Neurological:  Negative for loss of consciousness and headaches.  Psychiatric/Behavioral:  Negative for depression and suicidal ideas. The patient is nervous/anxious.        Objective:    Physical Exam Vitals and nursing note reviewed.  Constitutional:  General: He is not in acute distress.    Appearance: Normal appearance. He is well-developed. He is not ill-appearing.  HENT:     Head: Normocephalic and atraumatic.     Right Ear: External ear normal.     Left Ear: External ear normal.  Eyes:     Extraocular Movements: Extraocular movements intact.     Pupils: Pupils are equal, round, and reactive to light.  Neck:     Thyroid: No thyromegaly.  Cardiovascular:     Rate and Rhythm: Normal rate and regular rhythm.     Heart sounds: Normal heart sounds. No murmur heard.    No gallop.  Pulmonary:     Effort: Pulmonary effort is normal. No respiratory distress.     Breath sounds: Normal breath sounds. No wheezing or rales.  Chest:     Chest wall: No tenderness.  Musculoskeletal:     Cervical back: Normal range of motion and neck supple.     Right hip: Tenderness present. Normal range of motion. Normal strength.     Left hip: Tenderness present. Normal range of motion. Normal strength.     Right foot: Bony tenderness present. No swelling.     Left foot: Bony tenderness present. No swelling.  Skin:    General: Skin is warm and dry.  Neurological:     Mental Status: He is alert and oriented to person, place, and time.  Psychiatric:        Behavior: Behavior normal.        Thought Content: Thought content normal.        Judgment: Judgment normal.     BP (!) 142/100 (BP Location: Left Arm, Patient Position: Sitting, Cuff Size: Normal) Comment: Pt states forgot to take bp meds  Pulse 80   Temp 97.6 F (36.4 C) (Oral)   Resp 18   Ht _0  (1.778 m)   Wt 209 lb 3.2 oz (94.9 kg)   SpO2 97%   BMI 30.02 kg/m  Wt  Readings from Last 3 Encounters:  11/09/22 209 lb 3.2 oz (94.9 kg)  04/28/22 209 lb 9.6 oz (95.1 kg)  12/13/21 208 lb 6.4 oz (94.5 kg)       Assessment & Plan:  Suspicious nevus Assessment & Plan: Refer to dermatology  Orders: -     Ambulatory referral to Dermatology  Anxiety -     diazePAM; Take 1 tablet (10 mg total) by mouth 3 (three) times daily.  Dispense: 90 tablet; Refill: 0  Chronic pain syndrome -     traMADol HCl; 1 po q8h prn  Dispense: 90 tablet; Refill: 0  Primary hypertension Assessment & Plan: Well controlled, no changes to meds. Encouraged heart healthy diet such as the DASH diet and exercise as tolerated.   Elevated today-- pt forgot to take his medication  Orders: -     Lipoprotein A (LPA) -     Comprehensive metabolic panel -     Lipid panel -     CBC with Differential/Platelet  Hyperlipidemia, unspecified hyperlipidemia type Assessment & Plan: Encourage heart healthy diet such as MIND or DASH diet, increase exercise, avoid trans fats, simple carbohydrates and processed foods, consider a krill or fish or flaxseed oil cap daily.    Orders: -     Lipoprotein A (LPA) -     Comprehensive metabolic panel -     Lipid panel -     CBC with Differential/Platelet  Need for shingles vaccine -  Varicella-zoster vaccine IM    I, [name], DO, personally preformed the services described in this documentation.  All medical record entries made by the scribe were at my direction and in my presence.  I have reviewed the chart and discharge instructions (if applicable) and agree that the record reflects my personal performance and is accurate and complete. 11/09/2022   I,Jason Williams,acting as a scribe for [name], DO.,have documented all relevant documentation on the behalf of [name], DO,as directed by  [name], DO while in the presence of [name], DO.   [name], DO

## 2022-11-09 NOTE — Patient Instructions (Signed)

## 2022-11-09 NOTE — Assessment & Plan Note (Signed)
Encourage heart healthy diet such as MIND or DASH diet, increase exercise, avoid trans fats, simple carbohydrates and processed foods, consider a krill or fish or flaxseed oil cap daily.  °

## 2022-11-13 LAB — LIPOPROTEIN A (LPA): Lipoprotein (a): <10 nmol/L (ref <75)

## 2022-11-23 ENCOUNTER — Telehealth: Payer: Self-pay | Admitting: Family

## 2022-11-23 DIAGNOSIS — G894 Chronic pain syndrome: Secondary | ICD-10-CM

## 2022-11-23 DIAGNOSIS — F419 Anxiety disorder, unspecified: Secondary | ICD-10-CM

## 2022-11-23 NOTE — Telephone Encounter (Signed)
Both were refilled on 11/09/22.

## 2022-11-24 ENCOUNTER — Other Ambulatory Visit: Payer: Self-pay | Admitting: Family Medicine

## 2022-11-24 DIAGNOSIS — G894 Chronic pain syndrome: Secondary | ICD-10-CM

## 2022-11-24 DIAGNOSIS — F419 Anxiety disorder, unspecified: Secondary | ICD-10-CM

## 2022-11-24 MED ORDER — DIAZEPAM 10 MG PO TABS: 10.0000 mg | ORAL_TABLET | Freq: Three times a day (TID) | ORAL | 0 refills | Status: DC

## 2022-11-24 MED ORDER — TRAMADOL HCL 50 MG PO TABS: ORAL_TABLET | ORAL | 0 refills | Status: DC

## 2022-11-24 NOTE — Telephone Encounter (Signed)
Pt called back to follow up on denial of medication. After reviewing where they had been refilled on 12.14.23, asked pt if he had taken hose scripts to get refilled. Pt stated that she had mentioned about giving those printed scripts to him but he did not receive them. Pt stated that he wouldn't have been able to get them refilled at that time as the last fill he had was 11.27.23. Please Advise.

## 2022-11-24 NOTE — Telephone Encounter (Signed)
Meds were set to print. Please advise

## 2022-11-29 DIAGNOSIS — R3912 Poor urinary stream: Secondary | ICD-10-CM | POA: Diagnosis not present

## 2022-11-29 DIAGNOSIS — N401 Enlarged prostate with lower urinary tract symptoms: Secondary | ICD-10-CM | POA: Diagnosis not present

## 2022-11-29 DIAGNOSIS — R351 Nocturia: Secondary | ICD-10-CM | POA: Diagnosis not present

## 2022-12-04 ENCOUNTER — Telehealth: Payer: Self-pay | Admitting: Family Medicine

## 2022-12-04 ENCOUNTER — Other Ambulatory Visit: Payer: Self-pay | Admitting: Family

## 2022-12-04 ENCOUNTER — Other Ambulatory Visit: Payer: Self-pay

## 2022-12-04 DIAGNOSIS — G4700 Insomnia, unspecified: Secondary | ICD-10-CM

## 2022-12-04 NOTE — Telephone Encounter (Signed)
Pharmacy doesn't have RX    Requesting: Ambien Contract: 04/10/2022 UDS: 04/10/2022 Last OV: 11/09/2022 Next OV: N/A Last Refill: 09/21/2022, #90--0 RF Database:   Please advise

## 2022-12-04 NOTE — Progress Notes (Unsigned)
Pt states pharmacy doesn't have this Rx   Requesting: Ambien Contract: 04/10/2022 UDS: 04/10/2022 Last OV: 11/09/2022 Next OV: N/A Last Refill: 09/21/2022, #90--0 RF Database:   Please advise

## 2022-12-04 NOTE — Telephone Encounter (Signed)
Pt states Dr.Webb filled ambien on 10/5 and he confirmed with pharmacy he picked it up on 10/6. He states he never picked up the rx on 10/26 and when he called they stated he had no refills. Please advise.

## 2022-12-05 ENCOUNTER — Other Ambulatory Visit: Payer: Self-pay | Admitting: Family Medicine

## 2022-12-05 DIAGNOSIS — G4700 Insomnia, unspecified: Secondary | ICD-10-CM

## 2022-12-05 MED ORDER — ZOLPIDEM TARTRATE 10 MG PO TABS: ORAL_TABLET | ORAL | 0 refills | Status: DC

## 2022-12-06 ENCOUNTER — Telehealth: Payer: Self-pay | Admitting: Family Medicine

## 2022-12-06 NOTE — Telephone Encounter (Signed)
Patient wanted to know if it is possible for Dr. Etter Sjogren to write him a prescription for Voltaren pain relief gel. Please call to advise if possible. [facility] prefers [facility] [address].

## 2022-12-07 MED ORDER — DICLOFENAC SODIUM 1 % EX GEL: 2.0000 g | Freq: Four times a day (QID) | CUTANEOUS | 1 refills | Status: DC

## 2022-12-07 NOTE — Addendum Note (Signed)
Addended by: Sanda Linger on: 12/07/2022 11:05 AM   Modules accepted: Orders

## 2022-12-07 NOTE — Telephone Encounter (Signed)
Pt states having some neck pain. Pt made aware that this medication is OTC but would like med sent in to see if insurance will cover it

## 2022-12-12 ENCOUNTER — Telehealth: Payer: Self-pay | Admitting: Family Medicine

## 2022-12-12 NOTE — Telephone Encounter (Signed)
Pt states [facility] cannot see him until 2/6 so he was wondering if we could find anywhere that might be able to see him sooner. Please advise.

## 2022-12-12 NOTE — Telephone Encounter (Signed)
Pt advised.

## 2022-12-14 DIAGNOSIS — L570 Actinic keratosis: Secondary | ICD-10-CM | POA: Diagnosis not present

## 2022-12-14 DIAGNOSIS — D229 Melanocytic nevi, unspecified: Secondary | ICD-10-CM | POA: Diagnosis not present

## 2022-12-14 DIAGNOSIS — L814 Other melanin hyperpigmentation: Secondary | ICD-10-CM | POA: Diagnosis not present

## 2022-12-14 DIAGNOSIS — L821 Other seborrheic keratosis: Secondary | ICD-10-CM | POA: Diagnosis not present

## 2022-12-21 ENCOUNTER — Telehealth: Payer: Self-pay | Admitting: Family Medicine

## 2022-12-21 NOTE — Telephone Encounter (Signed)
Pt called stating that the issue with his neck (severe pain around C6 and L1) has gotten worse and he was wondering if a referral could be placed to help address this.

## 2022-12-25 ENCOUNTER — Ambulatory Visit (INDEPENDENT_AMBULATORY_CARE_PROVIDER_SITE_OTHER): Payer: BC Managed Care – PPO | Admitting: Family Medicine

## 2022-12-25 ENCOUNTER — Encounter: Payer: Self-pay | Admitting: Family Medicine

## 2022-12-25 VITALS — BP 132/98 | HR 87 | Temp 97.8°F | Resp 18 | Ht 70.0 in | Wt 209.6 lb

## 2022-12-25 DIAGNOSIS — M4722 Other spondylosis with radiculopathy, cervical region: Secondary | ICD-10-CM

## 2022-12-25 DIAGNOSIS — I10 Essential (primary) hypertension: Secondary | ICD-10-CM | POA: Diagnosis not present

## 2022-12-25 DIAGNOSIS — G8929 Other chronic pain: Secondary | ICD-10-CM

## 2022-12-25 DIAGNOSIS — F419 Anxiety disorder, unspecified: Secondary | ICD-10-CM | POA: Diagnosis not present

## 2022-12-25 DIAGNOSIS — M5441 Lumbago with sciatica, right side: Secondary | ICD-10-CM

## 2022-12-25 DIAGNOSIS — G894 Chronic pain syndrome: Secondary | ICD-10-CM | POA: Diagnosis not present

## 2022-12-25 DIAGNOSIS — M542 Cervicalgia: Secondary | ICD-10-CM

## 2022-12-25 MED ORDER — TRAMADOL HCL 50 MG PO TABS: ORAL_TABLET | ORAL | 0 refills | Status: DC

## 2022-12-25 MED ORDER — DIAZEPAM 10 MG PO TABS: 10.0000 mg | ORAL_TABLET | Freq: Three times a day (TID) | ORAL | 0 refills | Status: DC

## 2022-12-25 MED ORDER — LISINOPRIL 20 MG PO TABS: 20.0000 mg | ORAL_TABLET | Freq: Every day | ORAL | 3 refills | Status: DC

## 2022-12-25 MED ORDER — LISINOPRIL 10 MG PO TABS: 10.0000 mg | ORAL_TABLET | Freq: Every day | ORAL | 0 refills | Status: DC

## 2022-12-25 NOTE — Progress Notes (Addendum)
Subjective:   By signing my name below, I, Jason Williams, attest that this documentation has been prepared under the direction and in the presence of [name], DO 12/25/22   Patient ID: Jason Williams, male    DOB: 1961/02/25, 62 y.o.   MRN: [id number]  Chief Complaint  Patient presents with   Referral    Pt requesting referral to Ortho for neck pain and lower back pain    HPI Patient is in today for evaluation of neck and low back pain.  Due to his worsening neck pain and chronic back pain he has had difficulty relaxing. He even has neck pain with sitting and watching TV. He reports chronic numbness/tingling in his bilateral hands as well as burning RLE sciatica pain. He reports burning pain which radiates down his posterior leg down to his anterior Williams. His neck pain an associated radicular symptoms seemed to be his most bothersome complaint. He denies any weakness or loss of grip strength. He was prescribed Meloxicam in the past but never tried it. He is using ice gel packs for his neck with temporary pain relief. He would like a referral for orthopedic consult today.  He was previously followed by rheumatology for this and had multiple xrays done in 2019. He reports being diagnosed with osteoarthritis diffusely. He denies any falls precipitating his chronic back pain. However, he did play football when he was younger and was treated by a chiropractor for his low back pain at that time.  He also has concerns about his elevated diastolic blood pressures during office visits. He does not monitor his blood pressure at home. He has been compliant with Norvasc '10mg'$  daily and Lisinopril '10mg'$  daily. BP Readings from Last 5 Encounters:  12/25/22 (!) 132/98  11/09/22 (!) 142/100  04/28/22 140/82  12/13/21 (!) 132/98  11/10/21 136/80      Past Medical History:  Diagnosis Date   Anxiety    Arthritis    Depression    GERD (gastroesophageal reflux disease)    Heart murmur     Hyperlipidemia    Hypertension    Sleep apnea     Past Surgical History:  Procedure Laterality Date   ankle re construction  2011   bilateral   APPENDECTOMY  1981    Family History  Problem Relation Age of Onset   Esophageal cancer Mother    Heart attack Father        Died of MI at 98   Mental illness Father    Healthy Sister    Healthy Sister    Heart disease Maternal Grandfather        MI/CABG   Heart attack Maternal Grandfather    Colon cancer Neg Hx    Rectal cancer Neg Hx    Stomach cancer Neg Hx     Social History   Socioeconomic History   Marital status: Single    Spouse name: Not on file   Number of children: 1   Years of education: Not on file   Highest education level: Not on file  Occupational History   Occupation: Lobbyist: [employer]  Tobacco Use   Smoking status: Never   Smokeless tobacco: Never  Vaping Use   Vaping Use: Never used  Substance and Sexual Activity   Alcohol use: Yes    Alcohol/week: 2.0 standard drinks of alcohol    Types: 2 Glasses of wine per week   Drug use: No  Sexual activity: Yes    Birth control/protection: Pill, None    Comment: 1 male  Other Topics Concern   Not on file  Social History Narrative   Not on file   Social Determinants of Health   Financial Resource Strain: Not on file  Food Insecurity: Not on file  Transportation Needs: Not on file  Physical Activity: Not on file  Stress: Not on file  Social Connections: Not on file  Intimate Partner Violence: Not on file    Outpatient Medications Prior to Visit  Medication Sig Dispense Refill   amLODipine (NORVASC) 10 MG tablet Take 1 tablet (10 mg total) by mouth daily. 90 tablet 3   aspirin 81 MG chewable tablet TAKE 1 TABLET BY MOUTH DAILY TO THIN BLOOD TO HELP TO PREVENT HEART ATTACK AND STROKE     benzonatate (TESSALON) 100 MG capsule Take 1 capsule (100 mg total) by mouth 3 (three) times daily as needed for cough. 30 capsule 0    chlorhexidine gluconate, MEDLINE KIT, (PERIDEX) 0.12 % solution Use as directed 15 mLs in the mouth or throat 2 (two) times daily. 120 mL 0   diclofenac Sodium (VOLTAREN ARTHRITIS PAIN) 1 % GEL Apply 2 g topically 4 (four) times daily. 350 g 1   Evolocumab (REPATHA SURECLICK) 045 MG/ML SOAJ Inject 1 Dose into the skin every 14 (fourteen) days. 6 mL 3   fluticasone (FLONASE) 50 MCG/ACT nasal spray SPRAY 2 SPRAYS INTO EACH NOSTRIL EVERY DAY 48 mL 1   lidocaine (XYLOCAINE) 2 % solution Use as directed 15 mLs in the mouth or throat every 6 (six) hours as needed for mouth pain. Gargle and spit 100 mL 0   tadalafil (CIALIS) 20 MG tablet Take 1 tablet (20 mg total) by mouth as needed. 30 tablet 3   zolpidem (AMBIEN) 10 MG tablet TAKE 1 TABLET BY MOUTH EVERY DAY AT BEDTIME AS NEEDED FOR SLEEP 90 tablet 0   diazepam (VALIUM) 10 MG tablet Take 1 tablet (10 mg total) by mouth 3 (three) times daily. 90 tablet 0   lisinopril (ZESTRIL) 10 MG tablet TAKE 1 TABLET BY MOUTH EVERY DAY 90 tablet 0   traMADol (ULTRAM) 50 MG tablet 1 po q8h prn 90 tablet 0   No facility-administered medications prior to visit.    No Known Allergies  Review of Systems  Constitutional:  Negative for fever and malaise/fatigue.  HENT:  Negative for congestion, sinus pain and sore throat.   Eyes:  Negative for blurred vision.  Respiratory:  Negative for cough, shortness of breath and wheezing.   Cardiovascular:  Negative for chest pain, palpitations and leg swelling.  Gastrointestinal:  Negative for abdominal pain, blood in stool, constipation, diarrhea, nausea and vomiting.  Genitourinary:  Negative for dysuria, frequency and hematuria.  Musculoskeletal:  Positive for back pain and neck pain. Negative for falls.  Skin:  Negative for rash.  Neurological:  Positive for tingling (hands). Negative for dizziness, focal weakness, loss of consciousness, weakness and headaches.  Endo/Heme/Allergies:  Negative for environmental allergies.   Psychiatric/Behavioral:  Negative for depression. The patient is not nervous/anxious.        Objective:    Physical Exam Vitals and nursing note reviewed.  Constitutional:      Appearance: He is well-developed.  HENT:     Head: Normocephalic and atraumatic.     Nose: Nose normal.  Eyes:     Pupils: Pupils are equal, round, and reactive to light.  Neck:  Thyroid: No thyromegaly.  Cardiovascular:     Rate and Rhythm: Normal rate and regular rhythm.     Heart sounds: No murmur heard. Pulmonary:     Effort: Pulmonary effort is normal. No respiratory distress.     Breath sounds: Normal breath sounds. No wheezing or rales.  Chest:     Chest wall: No tenderness.  Musculoskeletal:     Cervical back: Normal range of motion and neck supple. Tenderness and crepitus present.     Lumbar back: Spasms and tenderness present. Positive right straight leg raise test.     Right hip: Tenderness present. Normal range of motion. Normal strength.     Left hip: Tenderness present. Normal range of motion. Normal strength.     Right foot: Bony tenderness present. No swelling.     Left foot: Bony tenderness present. No swelling.  Skin:    General: Skin is warm and dry.  Neurological:     Mental Status: He is alert and oriented to person, place, and time.  Psychiatric:        Behavior: Behavior normal.        Thought Content: Thought content normal.        Judgment: Judgment normal.     BP (!) 132/98 (BP Location: Left Arm, Patient Position: Sitting, Cuff Size: Normal)   Pulse 87   Temp 97.8 F (36.6 C) (Oral)   Resp 18   Ht '5\' 10"'$  (1.778 m)   Wt 209 lb 9.6 oz (95.1 kg)   SpO2 96%   BMI 30.07 kg/m  Wt Readings from Last 3 Encounters:  12/25/22 209 lb 9.6 oz (95.1 kg)  11/09/22 209 lb 3.2 oz (94.9 kg)  04/28/22 209 lb 9.6 oz (95.1 kg)       Assessment & Plan:  Chronic right-sided low back pain with right-sided sciatica -     Ambulatory referral to Orthopedic Surgery -     MR  LUMBAR SPINE WO CONTRAST; Future  Primary hypertension -     Lisinopril; Take 1 tablet (20 mg total) by mouth daily.  Dispense: 90 tablet; Refill: 3  Anxiety -     diazePAM; Take 1 tablet (10 mg total) by mouth 3 (three) times daily.  Dispense: 90 tablet; Refill: 0  Chronic pain syndrome -     traMADol HCl; 1 po q8h prn  Dispense: 90 tablet; Refill: 0  Osteoarthritis of spine with radiculopathy, cervical region -     Ambulatory referral to Orthopedic Surgery  Cervicalgia -     MR CERVICAL SPINE WO CONTRAST; Future     I,Alexis Herring,acting as a scribe for [employer], DO.,have documented all relevant documentation on the behalf of [name], DO,as directed by  [name], DO while in the presence of [place], DO, personally preformed the services described in this documentation.  All medical record entries made by the scribe were at my direction and in my presence.  I have reviewed the chart and discharge instructions (if applicable) and agree that the record reflects my personal performance and is accurate and complete. 12/25/22   [name], DO

## 2022-12-25 NOTE — Telephone Encounter (Signed)
Pt has appt today

## 2023-01-05 ENCOUNTER — Ambulatory Visit
Admission: RE | Admit: 2023-01-05 | Discharge: 2023-01-05 | Disposition: A | Discharge status: Home / Self Care | Payer: BC Managed Care – PPO | Source: Ambulatory Visit | Attending: Family Medicine | Admitting: Family Medicine

## 2023-01-05 DIAGNOSIS — M5126 Other intervertebral disc displacement, lumbar region: Secondary | ICD-10-CM | POA: Diagnosis not present

## 2023-01-05 DIAGNOSIS — M4802 Spinal stenosis, cervical region: Secondary | ICD-10-CM | POA: Diagnosis not present

## 2023-01-05 DIAGNOSIS — M542 Cervicalgia: Secondary | ICD-10-CM | POA: Diagnosis not present

## 2023-01-05 DIAGNOSIS — M47812 Spondylosis without myelopathy or radiculopathy, cervical region: Secondary | ICD-10-CM | POA: Diagnosis not present

## 2023-01-05 DIAGNOSIS — G8929 Other chronic pain: Secondary | ICD-10-CM

## 2023-01-10 DIAGNOSIS — M542 Cervicalgia: Secondary | ICD-10-CM | POA: Diagnosis not present

## 2023-01-17 DIAGNOSIS — G959 Disease of spinal cord, unspecified: Secondary | ICD-10-CM | POA: Diagnosis not present

## 2023-01-17 DIAGNOSIS — Z6830 Body mass index (BMI) 30.0-30.9, adult: Secondary | ICD-10-CM | POA: Diagnosis not present

## 2023-01-17 DIAGNOSIS — M4802 Spinal stenosis, cervical region: Secondary | ICD-10-CM | POA: Diagnosis not present

## 2023-01-22 ENCOUNTER — Telehealth: Payer: Self-pay | Admitting: Family Medicine

## 2023-01-22 NOTE — Telephone Encounter (Signed)
Patient would like to know if he can get a second opinion from another neurologist from the same office. He states he wants to see Dr. Erline Levine, he works in the same office he is currently referred to. Please advice.

## 2023-01-22 NOTE — Telephone Encounter (Signed)
Please advise? I don't see a recent referral to Neuro?

## 2023-02-06 ENCOUNTER — Other Ambulatory Visit: Payer: Self-pay | Admitting: Family Medicine

## 2023-02-06 DIAGNOSIS — G894 Chronic pain syndrome: Secondary | ICD-10-CM

## 2023-02-06 DIAGNOSIS — G4700 Insomnia, unspecified: Secondary | ICD-10-CM

## 2023-02-06 NOTE — Telephone Encounter (Signed)
Requesting: Tramadol Contract: 04/10/2022 UDS: 04/10/2022 Last OV: 12/25/2022 Next OV: N/A Last Refill: 12/25/2022, #90--0 RF Database:   Requesting: Valium Contract: UDS: Last OV: Next OV: Last Refill: 12/05/22, #90--0 RF Database:   Please advise

## 2023-02-06 NOTE — Telephone Encounter (Signed)
Medication: traMADol (ULTRAM) 50 MG tablet  diazepam (VALIUM) 10 MG tablet  Has the patient contacted their pharmacy? No.   [id number] [place] [address] [place] [address]Phone: [phone] Fax: [phone]

## 2023-02-07 ENCOUNTER — Telehealth: Payer: Self-pay | Admitting: Family Medicine

## 2023-02-07 DIAGNOSIS — F419 Anxiety disorder, unspecified: Secondary | ICD-10-CM

## 2023-02-07 MED ORDER — TRAMADOL HCL 50 MG PO TABS: ORAL_TABLET | ORAL | 0 refills | Status: DC

## 2023-02-07 MED ORDER — ZOLPIDEM TARTRATE 10 MG PO TABS: ORAL_TABLET | ORAL | 0 refills | Status: DC

## 2023-02-07 NOTE — Telephone Encounter (Signed)
Pt requested Tramadol and Diazepam yesterday but Tramadol and generic Ambien was sent in. Please send Diazepam to pharmacy.

## 2023-02-07 NOTE — Telephone Encounter (Signed)
Requesting: diazepam '10mg'$   Contract: 12/13/21 UDS: 04/10/22 Last Visit:  12/25/22 Next Visit: None Last Refill: 12/25/22 #90 and 0RF  Please Advise

## 2023-02-08 ENCOUNTER — Other Ambulatory Visit: Payer: Self-pay | Admitting: Family Medicine

## 2023-02-08 DIAGNOSIS — F419 Anxiety disorder, unspecified: Secondary | ICD-10-CM

## 2023-02-08 MED ORDER — DIAZEPAM 10 MG PO TABS: 10.0000 mg | ORAL_TABLET | Freq: Three times a day (TID) | ORAL | 0 refills | Status: DC

## 2023-02-09 DIAGNOSIS — M4712 Other spondylosis with myelopathy, cervical region: Secondary | ICD-10-CM | POA: Diagnosis not present

## 2023-02-09 DIAGNOSIS — M542 Cervicalgia: Secondary | ICD-10-CM | POA: Diagnosis not present

## 2023-02-12 ENCOUNTER — Telehealth: Payer: Self-pay | Admitting: Internal Medicine

## 2023-02-12 ENCOUNTER — Telehealth: Payer: Self-pay | Admitting: *Deleted

## 2023-02-12 NOTE — Telephone Encounter (Signed)
   Pre-operative Risk Assessment    Patient Name: Jason Williams  DOB: 1961-05-07 MRN: [id number]      Request for Surgical Clearance    Procedure:   CERVICAL FUSION  Date of Surgery:  Clearance TBD                                 Surgeon:  DR. Newman Pies Surgeon's Group or Practice Name:  [facility] Phone number:  [phone] Fax number:  [phone]   Type of Clearance Requested:   - Medical  - Pharmacy:  Hold Aspirin NOT INDICATED HOW LONG   Type of Anesthesia:  General    Additional requests/questions:    [name]   02/12/2023, 1:41 PM

## 2023-02-12 NOTE — Telephone Encounter (Signed)
   Name: Jason Williams  DOB: January 23, 1961  MRN: [id number]  Primary Cardiologist: Pixie Casino, MD  Chart reviewed as part of pre-operative protocol coverage. Because of [name]'s past medical history and time since last visit, he will require a follow-up in-office visit in order to better assess preoperative cardiovascular risk.  Pre-op covering staff: - Please schedule appointment and call patient to inform them. If patient already had an upcoming appointment within acceptable timeframe, please add "pre-op clearance" to the appointment notes so provider is aware. - Please contact requesting surgeon's office via preferred method (i.e, phone, fax) to inform them of need for appointment prior to surgery.   Mayra Reel, NP  02/12/2023, 2:09 PM

## 2023-02-12 NOTE — Telephone Encounter (Signed)
Left a message for the pt to call back to schedule an appt in office for pre op clearance.

## 2023-02-12 NOTE — Telephone Encounter (Signed)
Pt called in to see if there is a sooner availability for the pre-op clearance, other than 3/26 with Ambrose Pancoast, PA in which he is scheduled for. After speaking with Arbie Cookey, I advised the pt this is the soonest available for him. He says that he's been dealing with this issue (cervical stenosis) for over a month, and don't want to stretch it out for another month. Pt would like specifics on what the pre op clearance appt will entail. He is wondering if there will be a scan ordered, or something to delay his surgery even further. Pt states that he's been dealing with this since December 2023. He would like a callback asap.

## 2023-02-12 NOTE — Telephone Encounter (Signed)
Returned patient's call and made patient aware that we were unable to schedule him for a sooner appt. He also wanted to know whether he was going to have to do any further testing to delay him being cleared for surgery. I made him aware that when he is at his appt on 3/26 he will get an EKG and that it is up to [facility] to if any other testing need to be done. Pt verbalized understanding and thanked me for the call.

## 2023-02-12 NOTE — Telephone Encounter (Signed)
Pt has appt 02/20/23 @ 2:45 with Ambrose Pancoast, NP for pre op clearance. I will update all parties involved.

## 2023-02-13 ENCOUNTER — Other Ambulatory Visit: Payer: Self-pay | Admitting: Neurosurgery

## 2023-02-14 ENCOUNTER — Other Ambulatory Visit: Payer: Self-pay | Admitting: Neurosurgery

## 2023-02-19 NOTE — Progress Notes (Unsigned)
Office Visit    Patient Name: Jason Williams Date of Encounter: 02/19/2023  Primary Care Provider:  Carollee Herter, Alferd Apa, DO Primary Cardiologist:  Pixie Casino, MD Primary Electrophysiologist: None  Chief Complaint    Jason Williams is a 62 y.o. male with PMH of moderate nonobstructive CAD HTN, HLD (statin intolerant), GERD, anxiety/depression, sleep apnea who presents today for surgical clearance for upcoming cervical back procedure.  Past Medical History    Past Medical History:  Diagnosis Date   Anxiety    Arthritis    Depression    GERD (gastroesophageal reflux disease)    Heart murmur    Hyperlipidemia    Hypertension    Sleep apnea    Past Surgical History:  Procedure Laterality Date   ankle re construction  2011   bilateral   APPENDECTOMY  1981    Allergies  No Known Allergies  History of Present Illness    Jason Williams  is a 62 year old male with the above mention past medical history who presents today for preoperative clearance.  Jason Williams was initially in 2015 by Dr. Angelena Form for complaint of chest pain.  He described chest pain as left-sided and occurs exertionally.  He was sent for nuclear stress test that was normal with no evidence of ischemia.  2D echo was also completed and revealed EF of 55% with mildly dilated LA, no RWMA, with structurally normal valves.  Chest pain was considered nonischemic or cardiac in nature.  He was seen in follow-up on 08/2015 and blood pressure was elevated he was increased and patient reported intermittent noncompliance with Crestor.  He was referred to the lipid clinic in 2022 and was seen by Dr. Debara Pickett for evaluation for statin intolerance.  He was noted to have an elevated LP(a).  He underwent coronary CT for further restratification that revealed moderate nonobstructive CAD. He was placed on Praluent showed good response to his lipids.   Jason Williams presents today for preoperative clearance.  Since last being  seen in the office patient reports that he has been doing well but does endorse chest discomfort with increased exertion.  He is compliant with his medications and denies any adverse reactions.  He denies any palpitations or shortness of breath with physical activity.  He recently completed a cardiac CT that showed moderate nonobstructive CAD in his LAD.  Due to his history and current symptoms we will send him for stress test for further restratification prior to clearing him for surgery.  Patient denies chest pain, palpitations, dyspnea, PND, orthopnea, nausea, vomiting, dizziness, syncope, edema, weight gain, or early satiety.  Home Medications    Current Outpatient Medications  Medication Sig Dispense Refill   amLODipine (NORVASC) 10 MG tablet Take 1 tablet (10 mg total) by mouth daily. 90 tablet 3   aspirin 81 MG chewable tablet TAKE 1 TABLET BY MOUTH DAILY TO THIN BLOOD TO HELP TO PREVENT HEART ATTACK AND STROKE     benzonatate (TESSALON) 100 MG capsule Take 1 capsule (100 mg total) by mouth 3 (three) times daily as needed for cough. 30 capsule 0   chlorhexidine gluconate, MEDLINE KIT, (PERIDEX) 0.12 % solution Use as directed 15 mLs in the mouth or throat 2 (two) times daily. 120 mL 0   diazepam (VALIUM) 10 MG tablet Take 1 tablet (10 mg total) by mouth 3 (three) times daily. 90 tablet 0   diclofenac Sodium (VOLTAREN ARTHRITIS PAIN) 1 % GEL Apply 2 g  topically 4 (four) times daily. 350 g 1   Evolocumab (REPATHA SURECLICK) [id number] MG/ML SOAJ Inject 1 Dose into the skin every 14 (fourteen) days. 6 mL 3   fluticasone (FLONASE) 50 MCG/ACT nasal spray SPRAY 2 SPRAYS INTO EACH NOSTRIL EVERY DAY 48 mL 1   lidocaine (XYLOCAINE) 2 % solution Use as directed 15 mLs in the mouth or throat every 6 (six) hours as needed for mouth pain. Gargle and spit 100 mL 0   lisinopril (ZESTRIL) 20 MG tablet Take 1 tablet (20 mg total) by mouth daily. 90 tablet 3   tadalafil (CIALIS) 20 MG tablet Take 1 tablet (20 mg  total) by mouth as needed. 30 tablet 3   traMADol (ULTRAM) 50 MG tablet 1 po q8h prn 90 tablet 0   zolpidem (AMBIEN) 10 MG tablet TAKE 1 TABLET BY MOUTH EVERY DAY AT BEDTIME AS NEEDED FOR SLEEP 90 tablet 0   No current facility-administered medications for this visit.     Review of Systems  Please see the history of present illness.    (+) Chest pain (+) Increase shortness of breath with heavy exertion  All other systems reviewed and are otherwise negative except as noted above.  Physical Exam    Wt Readings from Last 3 Encounters:  12/25/22 209 lb 9.6 oz (95.1 kg)  11/09/22 209 lb 3.2 oz (94.9 kg)  04/28/22 209 lb 9.6 oz (95.1 kg)   [id number] were no vitals filed for this visit.,There is no height or weight on file to calculate BMI.  Constitutional:      Appearance: Healthy appearance. Not in distress.  Neck:     Vascular: JVD normal.  Pulmonary:     Effort: Pulmonary effort is normal.     Breath sounds: No wheezing. No rales. Diminished in the bases Cardiovascular:     Normal rate. Regular rhythm. Normal S1. Normal S2.      Murmurs: There is no murmur.  Edema:    Peripheral edema absent.  Abdominal:     Palpations: Abdomen is soft non tender. There is no hepatomegaly.  Skin:    General: Skin is warm and dry.  Neurological:     General: No focal deficit present.     Mental Status: Alert and oriented to person, place and time.     Cranial Nerves: Cranial nerves are intact.  EKG/LABS/ Recent Cardiac Studies    ECG personally reviewed by me today -sinus rhythm with rate of 60 bpm no acute changes consistent with previous EKG.     Lab Results  Component Value Date   WBC 5.9 11/09/2022   HGB 15.5 11/09/2022   HCT 44.7 11/09/2022   MCV 92.1 11/09/2022   PLT 225.0 11/09/2022   Lab Results  Component Value Date   CREATININE 0.96 11/09/2022   BUN 20 11/09/2022   NA 140 11/09/2022   K 4.6 11/09/2022   CL 106 11/09/2022   CO2 28 11/09/2022   Lab Results   Component Value Date   ALT 29 11/09/2022   AST 17 11/09/2022   ALKPHOS 49 11/09/2022   BILITOT 0.5 11/09/2022   Lab Results  Component Value Date   CHOL 113 11/09/2022   HDL 39.50 11/09/2022   LDLCALC 49 11/09/2022   TRIG 122.0 11/09/2022   CHOLHDL 3 11/09/2022    Lab Results  Component Value Date   HGBA1C 5.3 01/25/2015    Cardiac Studies & Procedures     STRESS TESTS  MYOCARDIAL PERFUSION IMAGING 08/05/2015  Narrative  Nuclear stress EF: 54%.  Horizontal ST segment depression ST segment depression of 1.8 mm was noted during stress in the aVF, II, III and V6 leads.  This is a low risk study.  The left ventricular ejection fraction is mildly decreased (45-54%).  The resting EKG shows nonspecific T wave flattening. With exercise the patient develops horizontal ST depression of 1.75 mm in inferolateral leads. These EKG changes were not associated with any chest discomfort. Perfusion imaging is normal.  No ischemia. LV EF is 54% with no segmental wall motion abnormalities.   ECHOCARDIOGRAM  ECHOCARDIOGRAM COMPLETE 08/05/2015  Narrative *[facility]* [address], [place] [address] [phone]  ------------------------------------------------------------------- Transthoracic Echocardiography  Patient:    Jason Williams, Jason Williams MR #:       [id number] Study Date: 08/05/2015 Gender:     M Age:        67 Height:     177.8 cm Weight:     91.2 kg BSA:        2.14 m^2 Pt. Status: Room:  SONOGRAPHER  Victorio Palm, RDCS ATTENDING    Rosita Fire, [place] ORDERING     [place], Brittainy M REFERRING    [place], [place] M PERFORMING   [facility], Outpatient  cc:  ------------------------------------------------------------------- LV EF: 55%  ------------------------------------------------------------------- Indications:      Chest pain (R07.9).  ------------------------------------------------------------------- History:   PMH:  Acquired from the  patient and from the patient&'s chart.  Chest pain.  Risk factors:  Family history of coronary artery disease. Hypertension. Dyslipidemia.  ------------------------------------------------------------------- Study Conclusions  - Left ventricle: The cavity size was normal. Systolic function was normal. The estimated ejection fraction was 55%. Wall motion was normal; there were no regional wall motion abnormalities. - Aortic valve: There was trivial regurgitation. - Left atrium: The atrium was mildly dilated. - Atrial septum: No defect or patent foramen ovale was identified.  Transthoracic echocardiography.  M-mode, complete 2D, spectral Doppler, and color Doppler.  Birthdate:  Patient birthdate: 11/21/1961.  Age:  Patient is 62 yr old.  Sex:  Gender: male. BMI: 28.8 kg/m^2.  Blood pressure:     130/88  Patient status: Outpatient.  Study date:  Study date: 08/05/2015. Study time: 08:48 AM.  Location:  [facility]  -------------------------------------------------------------------  ------------------------------------------------------------------- Left ventricle:  The cavity size was normal. Systolic function was normal. The estimated ejection fraction was 55%. Wall motion was normal; there were no regional wall motion abnormalities.  ------------------------------------------------------------------- Aortic valve:   Trileaflet; mildly thickened leaflets. Mobility was not restricted.  Doppler:  Transvalvular velocity was within the normal range. There was no stenosis. There was trivial regurgitation.  ------------------------------------------------------------------- Aorta:  The aorta was normal, not dilated, and non-diseased. Aortic root: The aortic root was normal in size.  ------------------------------------------------------------------- Mitral valve:   Mildly thickened leaflets . Mobility was not restricted.  Doppler:  Transvalvular velocity was within the  normal range. There was no evidence for stenosis. There was trivial regurgitation.    Peak gradient (D): 2 mm Hg.  ------------------------------------------------------------------- Left atrium:  The atrium was mildly dilated.  ------------------------------------------------------------------- Atrial septum:  No defect or patent foramen ovale was identified.  ------------------------------------------------------------------- Right ventricle:  The cavity size was normal. Wall thickness was normal. Systolic function was normal.  ------------------------------------------------------------------- Pulmonic valve:    Doppler:  Transvalvular velocity was within the normal range. There was no evidence for stenosis. There was trivial regurgitation.  ------------------------------------------------------------------- Tricuspid valve:   Structurally normal valve.    Doppler:  Transvalvular velocity was within the normal range. There was mild regurgitation.  ------------------------------------------------------------------- Pulmonary artery:   The main pulmonary artery was normal-sized. Systolic pressure was within the normal range.  ------------------------------------------------------------------- Right atrium:  The atrium was normal in size.  ------------------------------------------------------------------- Pericardium:  The pericardium was normal in appearance. There was no pericardial effusion.  ------------------------------------------------------------------- Systemic veins: Inferior vena cava: The vessel was normal in size.  ------------------------------------------------------------------- Post procedure conclusions Ascending Aorta:  - The aorta was normal, not dilated, and non-diseased.  ------------------------------------------------------------------- Measurements  Left ventricle                              Value        Reference LV ID, ED, PLAX chordal                      50.1  mm     43 - 52 LV ID, ES, PLAX chordal           (H)       38.4  mm     23 - 38 LV fx shortening, PLAX chordal    (L)       23    %      >=29 LV PW thickness, ED                         10.4  mm     --------- IVS/LV PW ratio, ED                         0.8          <=1.3 Stroke volume, 2D                           72    ml     --------- Stroke volume/bsa, 2D                       34    ml/m^2 --------- LV end-diastolic volume, 1-p Q000111Q            134   ml     --------- LV end-systolic volume, 1-p [id number]             52    ml     --------- LV ejection fraction, 1-p A4C               50    %      --------- LV end-diastolic volume/bsa, 1-p            63    ml/m^2 --------- Q000111Q LV end-systolic volume/bsa, 1-p             24    ml/m^2 --------- [id number] LV end-diastolic volume, 2-p                145   ml     --------- LV end-systolic volume, 2-p                 72    ml     --------- LV ejection fraction, 2-p                   50    %      --------- Stroke volume, 2-p  73    ml     --------- LV end-diastolic volume/bsa, 2-p            68    ml/m^2 --------- LV end-systolic volume/bsa, 2-p             34    ml/m^2 --------- Stroke volume/bsa, 2-p                      34.1  ml/m^2 --------- LV e&', lateral                              7.35  cm/s   --------- LV E/e&', lateral                            10.2         --------- LV e&', medial                               7.79  cm/s   --------- LV E/e&', medial                             9.63         --------- LV e&', average                              7.57  cm/s   --------- LV E/e&', average                            9.91         ---------  Ventricular septum                          Value        Reference IVS thickness, ED                           8.33  mm     ---------  LVOT                                        Value        Reference LVOT ID, S                                  22    mm      --------- LVOT area                                   3.8   cm^2   --------- LVOT ID                                     22    mm     --------- LVOT peak velocity, S  97.8  cm/s   --------- LVOT mean velocity, S                       64.4  cm/s   --------- LVOT VTI, S                                 19    cm     --------- LVOT peak gradient, S                       4     mm Hg  --------- Stroke volume (SV), LVOT DP                 72.2  ml     --------- Stroke index (SV/bsa), LVOT DP              33.7  ml/m^2 ---------  Aorta                                       Value        Reference Aortic root ID, ED                          31    mm     ---------  Left atrium                                 Value        Reference LA ID, A-P, ES                              45    mm     --------- LA ID/bsa, A-P                              2.1   cm/m^2 <=2.2 LA volume, S                                54    ml     --------- LA volume/bsa, S                            25.2  ml/m^2 --------- LA volume, ES, 1-p A4C                      50    ml     --------- LA volume/bsa, ES, 1-p A4C                  23.3  ml/m^2 --------- LA volume, ES, 1-p A2C                      56    ml     --------- LA volume/bsa, ES, 1-p A2C                  26.1  ml/m^2 ---------  Mitral valve  Value        Reference Mitral E-wave peak velocity                 75    cm/s   --------- Mitral A-wave peak velocity                 103   cm/s   --------- Mitral deceleration time          (L)       141   ms     150 - 230 Mitral peak gradient, D                     2     mm Hg  --------- Mitral E/A ratio, peak                      0.7          ---------  Right ventricle                             Value        Reference RV s&', lateral, S                           11.3  cm/s   ---------  Legend: (L)  and  (H)  mark values outside specified reference  range.  ------------------------------------------------------------------- Prepared and Electronically Authenticated by  Jenkins Rouge, M.D. 2016-09-08T14:51:01     CT SCANS  CT CORONARY MORPH W/CTA COR W/SCORE 08/23/2021  Addendum 08/23/2021  3:02 PM ADDENDUM REPORT: 08/23/2021 15:00  CLINICAL DATA:  89M with chest pain  EXAM: Cardiac/Coronary CTA  TECHNIQUE: The patient was scanned on a [employer].  FINDINGS: A 100 kV prospective scan was triggered in the descending thoracic aorta at 111 HU's. Axial non-contrast 3 mm slices were carried out through the heart. The data set was analyzed on a dedicated work station and scored using the [place]. Gantry rotation speed was 250 msecs and collimation was .6 mm. No beta blockade and 0.8 mg of sl NTG was given. The 3D data set was reconstructed in 5% intervals of the 35-75 % of the R-R cycle. Phases were analyzed on a dedicated work station using MPR, MIP and VRT modes. The patient received 80 cc of contrast.  Coronary Arteries:  Normal coronary origin.  Right dominance.  RCA is small and nondominant. There is calcified plaque in the proximal RCA causing 25-49% stenosis  Left main is a large artery that gives rise to LAD and LCX arteries.  LAD is a large vessel. There is calcified plaque in the proximal LAD causing 25-49% stenosis. There is calcified plaque in the mid LAD causing 25-49% stenosis  LCX is a dominant artery that gives rise to one large OM1 branch. There is calcified plaque in the proximal OM1 causing 0-24% stenosis. There is calcified plaque in OM2 causing 25-49% stenosis.  Other findings:  Left Ventricle: Normal size  Left Atrium: Mild enlargement  Pulmonary Veins: Normal configuration  Right Ventricle: Normal size  Right Atrium:  Mild enlargement  Cardiac valves: No calcifications  Thoracic aorta: Normal size  Pulmonary Arteries: Normal size  Systemic Veins: Normal  drainage  Pericardium: Normal thickness  IMPRESSION: 1. Coronary calcium score of 387. This was 88th percentile for age and sex matched control.  2.  Nonobstructive CAD  3.  Calcified plaque in the proximal and mid LAD causes mild (25-49%) stenosis  4. Calcified plaque in the proximal OM1 causes minimal (0-24%) stenosis. Calcified plaque in OM2 (small vessel, <59mm) causes mild (25-49%) stenosis.  5. Calcified plaque in proximal portion of small, nondominant RCA causes mild (25-49%) stenosis  CAD-RADS 2. Mild non-obstructive CAD (25-49%). Consider non-atherosclerotic causes of chest pain. Consider preventive therapy and risk factor modification.   Electronically Signed By: Oswaldo Milian M.D. On: 08/23/2021 15:00  Narrative EXAM: OVER-READ INTERPRETATION  CT CHEST  The following report is an over-read performed by radiologist Dr. Abigail Miyamoto of [facility], [place] on 08/23/2021. This over-read does not include interpretation of cardiac or coronary anatomy or pathology. The coronary CTA interpretation by the cardiologist is attached.  COMPARISON:  07/29/2008 chest radiograph.  FINDINGS: Vascular: Normal aortic caliber. No central pulmonary embolism, on this non-dedicated study.  Mediastinum/Nodes: No imaged thoracic adenopathy.  Lungs/Pleura: No pleural fluid.  Clear imaged lungs.  Upper Abdomen: Normal imaged portions of the liver, spleen, stomach.  Musculoskeletal: No acute osseous abnormality.  IMPRESSION: No acute findings in the imaged extracardiac chest.  Electronically Signed: By: Abigail Miyamoto M.D. On: 08/23/2021 09:44          Assessment & Plan    Surgical Clearance: -Patient will undergo pulm with prior to being cleared for upcoming cervical surgical procedure.  Jason Williams perioperative risk of a major cardiac event is 0.9% according to the Revised Cardiac Risk Index (RCRI).  Therefore, he is at low risk for perioperative  complications.   His functional capacity is fair at 5.29 METs according to the Duke Activity Status Index (DASI). Recommendations: According to ACC/AHA guidelines, no further cardiovascular testing needed.  The patient may proceed to surgery at acceptable risk.   Antiplatelet and/or Anticoagulation Recommendations:  2.  Essential hypertension: -Patient's blood pressure today is well-controlled at 118/72 -Continue Norvasc 10 mg daily and Zestril 20 mg daily  3.  Hyperlipidemia: -Patient is statin intolerant and is currently on PCSK9 inhibitor with most recent LDL of 49 -He is currently followed by Dr. Debara Pickett in lipid clinic.  4.  Moderate nonobstructive CAD/stable angina: -Coronary CTA completed 2022 showing calcium score of 387 and mild obstruction in the proximal and mid LAD with -Patient had complaint of chest discomfort with exertional activity and will complete POET prior to having clearance granted. -Patient is currently on ASA 81 mg that he is on Repatha  Disposition: Follow-up with Pixie Casino, MD or APP as scheduled   This procedure has been fully reviewed with the patient and written informed consent has been obtained.  Medication Adjustments/Labs and Tests Ordered: Current medicines are reviewed at length with the patient today.  Concerns regarding medicines are outlined above.   Shared Decision Making/Informed Consent The risks [chest pain, shortness of breath, cardiac arrhythmias, dizziness, blood pressure fluctuations, myocardial infarction, stroke/transient ischemic attack, and life-threatening complications (estimated to be 1 in 10,000)], benefits (risk stratification, diagnosing coronary artery disease, treatment guidance) and alternatives of an exercise tolerance test were discussed in detail with Ms. Edison Pace and she agrees to proceed.   Signed, Mable Fill, Marissa Nestle, NP 02/19/2023, 1:02 PM [facility]  Note:  This document was prepared using  Dragon voice recognition software and may include unintentional dictation errors.

## 2023-02-20 ENCOUNTER — Ambulatory Visit: Payer: BC Managed Care – PPO | Attending: Nurse Practitioner | Admitting: Nurse Practitioner

## 2023-02-20 ENCOUNTER — Encounter: Payer: Self-pay | Admitting: Nurse Practitioner

## 2023-02-20 VITALS — BP 118/72 | HR 68 | Ht 70.0 in | Wt 208.4 lb

## 2023-02-20 DIAGNOSIS — I25118 Atherosclerotic heart disease of native coronary artery with other forms of angina pectoris: Secondary | ICD-10-CM

## 2023-02-20 DIAGNOSIS — Z01810 Encounter for preprocedural cardiovascular examination: Secondary | ICD-10-CM

## 2023-02-20 DIAGNOSIS — R0989 Other specified symptoms and signs involving the circulatory and respiratory systems: Secondary | ICD-10-CM

## 2023-02-20 DIAGNOSIS — E785 Hyperlipidemia, unspecified: Secondary | ICD-10-CM | POA: Diagnosis not present

## 2023-02-20 DIAGNOSIS — I2089 Other forms of angina pectoris: Secondary | ICD-10-CM

## 2023-02-20 DIAGNOSIS — I10 Essential (primary) hypertension: Secondary | ICD-10-CM

## 2023-02-20 DIAGNOSIS — I2510 Atherosclerotic heart disease of native coronary artery without angina pectoris: Secondary | ICD-10-CM

## 2023-02-20 NOTE — Patient Instructions (Signed)
Medication Instructions:  Your physician recommends that you continue on your current medications as directed. Please refer to the Current Medication list given to you today. *If you need a refill on your cardiac medications before your next appointment, please call your pharmacy*   Lab Work: NONE ORDERED   Testing/Procedures: Your physician has requested that you have an exercise tolerance test. For further information please visit [email or web address]. Please also follow instruction sheet, as given. PENDING THESE RESULTS WILL DETERMINE IF WE WILL CLEAR YOU FOR SURGERY.  Your physician has requested that you have a carotid duplex. This test is an ultrasound of the carotid arteries in your neck. It looks at blood flow through these arteries that supply the brain with blood. Allow one hour for this exam. There are no restrictions or special instructions.   Follow-Up: At [facility], you and your health needs are our priority.  As part of our continuing mission to provide you with exceptional heart care, we have created designated Provider Care Teams.  These Care Teams include your primary Cardiologist (physician) and Advanced Practice Providers (APPs -  Physician Assistants and Nurse Practitioners) who all work together to provide you with the care you need, when you need it.  We recommend signing up for the patient portal called "MyChart".  Sign up information is provided on this After Visit Summary.  MyChart is used to connect with patients for Virtual Visits (Telemedicine).  Patients are able to view lab/test results, encounter notes, upcoming appointments, etc.  Non-urgent messages can be sent to your provider as well.   To learn more about what you can do with MyChart, go to [email or web address].    Your next appointment:   FIRST AVAILABLE    Provider:   Pixie Casino, MD     Other Instructions

## 2023-02-20 NOTE — Pre-Procedure Instructions (Signed)
Surgical Instructions    Your procedure is scheduled on Wednesday, April 3rd.  Report to [facility] Main Entrance "A" at 11:00 A.M., then check in with the Admitting office.  Call this number if you have problems the morning of surgery:  [phone]  If you have any questions prior to your surgery date call [phone]: Open Monday-Friday 8am-4pm If you experience any cold or flu symptoms such as cough, fever, chills, shortness of breath, etc. between now and your scheduled surgery, please notify us at the above number.     Remember:  Do not eat or drink after midnight the night before your surgery     Take these medicines the morning of surgery with A SIP OF WATER  amLODipine (NORVASC) diazepam (VALIUM)   chlorhexidine gluconate, MEDLINE KIT, (PERIDEX)  fluticasone (FLONASE) 50 MCG/ACT nasal spray    Take these medications as needed: benzonatate (TESSALON)  traMADol (ULTRAM)   Follow your surgeon's instructions on when to stop Aspirin.  If no instructions were given by your surgeon then you will need to call the office to get those instructions. This includes: diclofenac Sodium (VOLTAREN ARTHRITIS PAIN) 1 % GEL   As of today, STOP taking any Aleve, Naproxen, Ibuprofen, Motrin, Advil, Goody's, BC's, all herbal medications, fish oil, and all vitamins.                     Do NOT Smoke (Tobacco/Vaping) for 24 hours prior to your procedure.  If you use a CPAP at night, you may bring your mask/headgear for your overnight stay.   Contacts, glasses, piercing's, hearing aid's, dentures or partials may not be worn into surgery, please bring cases for these belongings.    For patients admitted to the hospital, discharge time will be determined by your treatment team.   Patients discharged the day of surgery will not be allowed to drive home, and someone needs to stay with them for 24 hours.  SURGICAL WAITING ROOM VISITATION Patients having surgery or a procedure may have no more  than 2 support people in the waiting area - these visitors may rotate.   Children under the age of 41 must have an adult with them who is not the patient. If the patient needs to stay at the hospital during part of their recovery, the visitor guidelines for inpatient rooms apply. Pre-op nurse will coordinate an appropriate time for 1 support person to accompany patient in pre-op.  This support person may not rotate.   Please refer to the [facility] website for the visitor guidelines for Inpatients (after your surgery is over and you are in a regular room).    Special instructions:   [place]- Preparing For Surgery  Before surgery, you can play an important role. Because skin is not sterile, your skin needs to be as free of germs as possible. You can reduce the number of germs on your skin by washing with CHG (chlorahexidine gluconate) Soap before surgery.  CHG is an antiseptic cleaner which kills germs and bonds with the skin to continue killing germs even after washing.    Oral Hygiene is also important to reduce your risk of infection.  Remember - BRUSH YOUR TEETH THE MORNING OF SURGERY WITH YOUR REGULAR TOOTHPASTE  Please do not use if you have an allergy to CHG or antibacterial soaps. If your skin becomes reddened/irritated stop using the CHG.  Do not shave (including legs and underarms) for at least 48 hours prior to first CHG shower.  It is OK to shave your face.  Please follow these instructions carefully.   Shower the NIGHT BEFORE SURGERY and the MORNING OF SURGERY  If you chose to wash your hair, wash your hair first as usual with your normal shampoo.  After you shampoo, rinse your hair and body thoroughly to remove the shampoo.  Use CHG Soap as you would any other liquid soap. You can apply CHG directly to the skin and wash gently with a scrungie or a clean washcloth.   Apply the CHG Soap to your body ONLY FROM THE NECK DOWN.  Do not use on open wounds or open sores. Avoid  contact with your eyes, ears, mouth and genitals (private parts). Wash Face and genitals (private parts)  with your normal soap.   Wash thoroughly, paying special attention to the area where your surgery will be performed.  Thoroughly rinse your body with warm water from the neck down.  DO NOT shower/wash with your normal soap after using and rinsing off the CHG Soap.  Pat yourself dry with a CLEAN TOWEL.  Wear CLEAN PAJAMAS to bed the night before surgery  Place CLEAN SHEETS on your bed the night before your surgery  DO NOT SLEEP WITH PETS.   Day of Surgery: Take a shower with CHG soap. Do not wear jewelry Do not wear lotions, powders, colognes, or deodorant. Men may shave face and neck. Do not bring valuables to the hospital.  [facility] is not responsible for any belongings or valuables. Do not wear nail polish, gel polish, artificial nails, or any other type of covering on natural nails (fingers and toes) If you have artificial nails or gel coating that need to be removed by a nail salon, please have this removed prior to surgery. Artificial nails or gel coating may interfere with anesthesia's ability to adequately monitor your vital signs. Wear Clean/Comfortable clothing the morning of surgery Remember to brush your teeth WITH YOUR REGULAR TOOTHPASTE.   Please read over the following fact sheets that you were given.    If you received a COVID test during your pre-op visit  it is requested that you wear a mask when out in public, stay away from anyone that may not be feeling well and notify your surgeon if you develop symptoms. If you have been in contact with anyone that has tested positive in the last 10 days please notify you surgeon.

## 2023-02-21 ENCOUNTER — Ambulatory Visit (HOSPITAL_COMMUNITY)
Admission: RE | Admit: 2023-02-21 | Discharge: 2023-02-21 | Disposition: A | Discharge status: Home / Self Care | Payer: BC Managed Care – PPO | Source: Ambulatory Visit | Attending: Nurse Practitioner | Admitting: Nurse Practitioner

## 2023-02-21 ENCOUNTER — Ambulatory Visit (INDEPENDENT_AMBULATORY_CARE_PROVIDER_SITE_OTHER): Payer: BC Managed Care – PPO

## 2023-02-21 ENCOUNTER — Encounter (HOSPITAL_COMMUNITY)
Admission: RE | Admit: 2023-02-21 | Discharge: 2023-02-21 | Disposition: A | Discharge status: Home / Self Care | Payer: BC Managed Care – PPO | Source: Ambulatory Visit | Attending: Neurosurgery | Admitting: Neurosurgery

## 2023-02-21 ENCOUNTER — Encounter (HOSPITAL_COMMUNITY): Payer: Self-pay

## 2023-02-21 ENCOUNTER — Other Ambulatory Visit: Payer: Self-pay | Admitting: Nurse Practitioner

## 2023-02-21 ENCOUNTER — Other Ambulatory Visit: Payer: Self-pay

## 2023-02-21 VITALS — BP 137/87 | HR 83 | Temp 97.8°F | Resp 18 | Ht 70.0 in | Wt 208.8 lb

## 2023-02-21 DIAGNOSIS — E785 Hyperlipidemia, unspecified: Secondary | ICD-10-CM

## 2023-02-21 DIAGNOSIS — I6522 Occlusion and stenosis of left carotid artery: Secondary | ICD-10-CM

## 2023-02-21 DIAGNOSIS — I2510 Atherosclerotic heart disease of native coronary artery without angina pectoris: Secondary | ICD-10-CM | POA: Insufficient documentation

## 2023-02-21 DIAGNOSIS — Z01810 Encounter for preprocedural cardiovascular examination: Secondary | ICD-10-CM

## 2023-02-21 DIAGNOSIS — I10 Essential (primary) hypertension: Secondary | ICD-10-CM

## 2023-02-21 DIAGNOSIS — Z01818 Encounter for other preprocedural examination: Secondary | ICD-10-CM

## 2023-02-21 DIAGNOSIS — I2089 Other forms of angina pectoris: Secondary | ICD-10-CM

## 2023-02-21 HISTORY — DX: Headache, unspecified: R51.9

## 2023-02-21 HISTORY — DX: Personal history of Methicillin resistant Staphylococcus aureus infection: Z86.14

## 2023-02-21 LAB — BASIC METABOLIC PANEL
Anion gap: 8 (ref 5–15)
BUN: 16 mg/dL (ref 8–23)
CO2: 25 mmol/L (ref 22–32)
Calcium: 9.4 mg/dL (ref 8.9–10.3)
Chloride: 104 mmol/L (ref 98–111)
Creatinine, Ser: 1.2 mg/dL (ref 0.61–1.24)
GFR, Estimated: >60 mL/min (ref >60)
Glucose, Bld: 103 mg/dL — ABNORMAL HIGH (ref 70–99)
Potassium: 4.5 mmol/L (ref 3.5–5.1)
Sodium: 137 mmol/L (ref 135–145)

## 2023-02-21 LAB — TYPE AND SCREEN
ABO/RH(D): A NEG
Antibody Screen: NEGATIVE

## 2023-02-21 LAB — SURGICAL PCR SCREEN
MRSA, PCR: NEGATIVE
Staphylococcus aureus: NEGATIVE

## 2023-02-21 LAB — CBC
HCT: 46 % (ref 39.0–52.0)
Hemoglobin: 15.7 g/dL (ref 13.0–17.0)
MCH: 31.2 pg (ref 26.0–34.0)
MCHC: 34.1 g/dL (ref 30.0–36.0)
MCV: 91.5 fL (ref 80.0–100.0)
Platelets: 238 10*3/uL (ref 150–400)
RBC: 5.03 MIL/uL (ref 4.22–5.81)
RDW: 12.6 % (ref 11.5–15.5)
WBC: 5.6 10*3/uL (ref 4.0–10.5)
nRBC: 0 % (ref 0.0–0.2)

## 2023-02-21 LAB — EXERCISE TOLERANCE TEST
Angina Index: 0
Duke Treadmill Score: 7
Estimated workload: 8.1
Exercise duration (min): 6 min
Exercise duration (sec): 44 s
MPHR: 159 {beats}/min
Peak HR: 153 {beats}/min
Percent HR: 96 %
RPE: 16
Rest HR: 95 {beats}/min
ST Depression (mm): 0 mm

## 2023-02-21 NOTE — Progress Notes (Addendum)
PCP - [name], DO  Cardiologist -    Pixie Casino, MD    PPM/ICD - denies   Chest x-ray - N/A EKG - 02/20/23- tracing not scanned into epic yet Stress Test - 02/21/2023 - appt at 11:30 ECHO - 2016 Cardiac Cath - denies  Sleep Study - pt states he has sleep apnea but does not use CPAP   Fasting Blood Sugar - N/A   Last dose of GLP1 agonist-  N/A   Blood Thinner Instructions: N/A Aspirin Instructions: N/a  ERAS Protcol - NPO order   COVID TEST- N/A  Pharmacy unavailable to do medication review- pt given number for pharmacy call center to review medications.   Anesthesia review: review cardiac tests- pt states he gets chest pain with strenuous activity. Pt states that he saw cardiology yesterday and has stress test and carotid ultrasound scheduled for today for clearance.   Patient denies shortness of breath, fever, cough and chest pain at PAT appointment   All instructions explained to the patient, with a verbal understanding of the material. Patient agrees to go over the instructions while at home for a better understanding. Patient also instructed to self quarantine after being tested for COVID-19. The opportunity to ask questions was provided.

## 2023-02-21 NOTE — Pre-Procedure Instructions (Signed)
Surgical Instructions    Your procedure is scheduled on Wednesday, April 3rd.  Report to [facility] Main Entrance "A" at 11:00 A.M., then check in with the Admitting office.  Call this number if you have problems the morning of surgery:  [phone]  If you have any questions prior to your surgery date call [phone]: Open Monday-Friday 8am-4pm If you experience any cold or flu symptoms such as cough, fever, chills, shortness of breath, etc. between now and your scheduled surgery, please notify us at the above number.     Remember:  Do not eat or drink after midnight the night before your surgery     Take these medicines the morning of surgery with A SIP OF WATER  amLODipine (NORVASC) diazepam (VALIUM)      Take these medications as needed:  traMADol (ULTRAM)   Follow your surgeon's instructions on when to stop Aspirin.  If no instructions were given by your surgeon then you will need to call the office to get those instructions.   As of today, STOP taking any Aleve, Naproxen, Ibuprofen, Motrin, Advil, Goody's, BC's, all herbal medications, fish oil, and all vitamins.                     Do NOT Smoke (Tobacco/Vaping) for 24 hours prior to your procedure.  If you use a CPAP at night, you may bring your mask/headgear for your overnight stay.   Contacts, glasses, piercing's, hearing aid's, dentures or partials may not be worn into surgery, please bring cases for these belongings.    For patients admitted to the hospital, discharge time will be determined by your treatment team.   Patients discharged the day of surgery will not be allowed to drive home, and someone needs to stay with them for 24 hours.  SURGICAL WAITING ROOM VISITATION Patients having surgery or a procedure may have no more than 2 support people in the waiting area - these visitors may rotate.   Children under the age of 66 must have an adult with them who is not the patient. If the patient needs to stay at  the hospital during part of their recovery, the visitor guidelines for inpatient rooms apply. Pre-op nurse will coordinate an appropriate time for 1 support person to accompany patient in pre-op.  This support person may not rotate.   Please refer to the [facility] website for the visitor guidelines for Inpatients (after your surgery is over and you are in a regular room).    Special instructions:   [facility]- Preparing For Surgery  Before surgery, you can play an important role. Because skin is not sterile, your skin needs to be as free of germs as possible. You can reduce the number of germs on your skin by washing with CHG (chlorahexidine gluconate) Soap before surgery.  CHG is an antiseptic cleaner which kills germs and bonds with the skin to continue killing germs even after washing.    Oral Hygiene is also important to reduce your risk of infection.  Remember - BRUSH YOUR TEETH THE MORNING OF SURGERY WITH YOUR REGULAR TOOTHPASTE  Please do not use if you have an allergy to CHG or antibacterial soaps. If your skin becomes reddened/irritated stop using the CHG.  Do not shave (including legs and underarms) for at least 48 hours prior to first CHG shower. It is OK to shave your face.  Please follow these instructions carefully.   Shower the NIGHT BEFORE SURGERY and the MORNING OF  SURGERY  If you chose to wash your hair, wash your hair first as usual with your normal shampoo.  After you shampoo, rinse your hair and body thoroughly to remove the shampoo.  Use CHG Soap as you would any other liquid soap. You can apply CHG directly to the skin and wash gently with a scrungie or a clean washcloth.   Apply the CHG Soap to your body ONLY FROM THE NECK DOWN.  Do not use on open wounds or open sores. Avoid contact with your eyes, ears, mouth and genitals (private parts). Wash Face and genitals (private parts)  with your normal soap.   Wash thoroughly, paying special attention to the area where  your surgery will be performed.  Thoroughly rinse your body with warm water from the neck down.  DO NOT shower/wash with your normal soap after using and rinsing off the CHG Soap.  Pat yourself dry with a CLEAN TOWEL.  Wear CLEAN PAJAMAS to bed the night before surgery  Place CLEAN SHEETS on your bed the night before your surgery  DO NOT SLEEP WITH PETS.   Day of Surgery: Take a shower with CHG soap. Do not wear jewelry Do not wear lotions, powders, colognes, or deodorant. Men may shave face and neck. Do not bring valuables to the hospital.  [facility] is not responsible for any belongings or valuables. Do not wear nail polish, gel polish, artificial nails, or any other type of covering on natural nails (fingers and toes) If you have artificial nails or gel coating that need to be removed by a nail salon, please have this removed prior to surgery. Artificial nails or gel coating may interfere with anesthesia's ability to adequately monitor your vital signs. Wear Clean/Comfortable clothing the morning of surgery Remember to brush your teeth WITH YOUR REGULAR TOOTHPASTE.   Please read over the following fact sheets that you were given.    If you received a COVID test during your pre-op visit  it is requested that you wear a mask when out in public, stay away from anyone that may not be feeling well and notify your surgeon if you develop symptoms. If you have been in contact with anyone that has tested positive in the last 10 days please notify you surgeon.

## 2023-02-22 NOTE — Anesthesia Preprocedure Evaluation (Addendum)
Anesthesia Evaluation  Patient identified by MRN, date of birth, ID band Patient awake    Reviewed: Allergy & Precautions, NPO status , Patient's Chart, lab work & pertinent test results  History of Anesthesia Complications Negative for: history of anesthetic complications  Airway Mallampati: III  TM Distance: <3 FB Neck ROM: Limited    Dental  (+) Teeth Intact, Dental Advisory Given   Pulmonary sleep apnea    breath sounds clear to auscultation       Cardiovascular hypertension, Pt. on medications (-) angina (-) Past MI and (-) CHF  Rhythm:Regular     Neuro/Psych  Headaches PSYCHIATRIC DISORDERS Anxiety Depression     Neuromuscular disease    GI/Hepatic Neg liver ROS,GERD  ,,  Endo/Other  negative endocrine ROS    Renal/GU negative Renal ROS     Musculoskeletal  (+) Arthritis ,    Abdominal   Peds  Hematology negative hematology ROS (+)   Anesthesia Other Findings   Reproductive/Obstetrics                             Anesthesia Physical Anesthesia Plan  ASA: 2  Anesthesia Plan: General   Post-op Pain Management: Ofirmev IV (intra-op)* and Toradol IV (intra-op)*   Induction: Intravenous  PONV Risk Score and Plan: 2 and Ondansetron and Dexamethasone  Airway Management Planned: Oral ETT and Video Laryngoscope Planned  Additional Equipment: None  Intra-op Plan:   Post-operative Plan: Extubation in OR  Informed Consent: I have reviewed the patients History and Physical, chart, labs and discussed the procedure including the risks, benefits and alternatives for the proposed anesthesia with the patient or authorized representative who has indicated his/her understanding and acceptance.     Dental advisory given  Plan Discussed with: CRNA  Anesthesia Plan Comments: (PAT note written 02/22/2023 by Myra Gianotti, PA-C.  )       Anesthesia Quick Evaluation

## 2023-02-22 NOTE — Progress Notes (Signed)
Anesthesia Chart Review:  Case: [id number] Date/Time: 02/28/23 1248   Procedure: ACDF,IP,PLATE/SCREWS C34, C45, C56 - 3C   Anesthesia type: General   Pre-op diagnosis: CERVICAL SPONDYLOSIS WITH MYELOPATHY AND RADICULOPATHY   Location: [place] OR ROOM 19 / [place] OR   Surgeons: Newman Pies, MD       DISCUSSION: Patient is a 62 year old male scheduled for the above procedure.  History includes never smoker, HTN, HLD, childhood murmur (trivial AI 2016), OSA (does not use CPAP), GERD, anxiety, depression, ankle reconstruction (2011), appendectomy.   He had recent cardiology evaluation for preoperative evaluation with history of chest pain. By notes, he was initially seen by cardiology in 2015 for exertional chest and had a non-ischemic stress test. Echo on 08/05/15 showed LVEF 55%, no regional wall motion abnormalities, trivial AI. He was seen by Dr. Debara Pickett in 2022 at the [facility] for statin intolerance and had an elevated LP(a). He underwent Coronary CT that showed elevated Coronary Ca Score 387 (88th percentile), non-obstructive CAD (25-49% proximal & mid LAD, < 25mm OM2, small non-dominant RCA). He is on Repatha. Most recently he was evaluated by Rebekah Chesterfield, NP on 02/20/23. He denied SOB, palpitations, but reported some chest discomfort with increased exertion. An ETT was ordered. Carotid US also ordered for left carotid bruit. 02/21/23 ETT was non-ischemic and carotid US showed only 123456 LICA stenosis. He added: "Surgical Clearance: -Mr. Alizadeh completed a treadmill stress test due to complaint of stable angina.  He was able to achieve 8.1 METS of activity with no evidence of ischemia or ST deviation.  He reported no symptoms of angina during his testing.  He will be fine to proceed with his scheduled surgical procedure.     Mr. Snape perioperative risk of a major cardiac event is 0.9% according to the Revised Cardiac Risk Index (RCRI).  Therefore, he is at low risk for perioperative  complications.   His functional capacity is fair at 5.29 METs according to the Duke Activity Status Index (DASI). Recommendations: According to ACC/AHA guidelines, no further cardiovascular testing needed.  The patient may proceed to surgery at acceptable risk."   Anesthesia team to evaluate on the day of surgery.   VS: BP 137/87   Pulse 83   Temp 36.6 C (Oral)   Resp 18   Ht 5\' 10"  (1.778 m)   Wt 94.7 kg   SpO2 99%   BMI 29.96 kg/m   PROVIDERS: [name], DO is PCP  Pixie Casino, MD is cardiologist   LABS: Labs reviewed: Acceptable for surgery. (all labs ordered are listed, but only abnormal results are displayed)  Labs Reviewed  BASIC METABOLIC PANEL - Abnormal; Notable for the following components:      Result Value   Glucose, Bld 103 (*)    All other components within normal limits  SURGICAL PCR SCREEN  CBC  TYPE AND SCREEN    IMAGES: MRI L-spine 01/05/23: IMPRESSION: 1. Mild bilateral lateral recess encroachment at L1-2. 2. Mild bilateral lateral recess stenosis at L2-3. 3. Bilateral pars defects at L5 but no spondylolisthesis.   MRI C-spine 01/05/23: IMPRESSION: 1. Degenerative cervical spondylosis with multilevel disc disease and facet disease. 2. Significant spinal stenosis at C4-5 and C5-6 as detailed above. 3. Small focus of T2 and STIR hyperintensity in the left side of the cord just below the C4-5 disc space along with mild left-sided cord atrophy. 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 4. Mild to moderate left foraminal  stenosis at C3-4. 5. Mild bilateral foraminal stenosis at C4-5, left greater than right. 6. Mild right and moderate left foraminal stenosis at C5-6.   EKG: 02/20/23: NSR   CV: ETT 02/21/23:   Patient exercised according to the BRUCE protocol for 6:42min achieving 8.1METs   Target HR was achieved (153bpm; 96% MPHR)   No ST deviation was noted.   No electrocardiographic evidence of  ischemia   US Carotid 02/21/23: Summary:  - Right Carotid: There is no evidence of stenosis in the right ICA. The extracranial vessels were near-normal with only minimal wall thickening or plaque.  - Left Carotid: Velocities in the left ICA are consistent with a 1-39% stenosis. Non-hemodynamically significant plaque <50% noted in the CCA.  - Vertebrals: Bilateral vertebral arteries demonstrate antegrade flow.  - Subclavians: Normal flow hemodynamics were seen in bilateral subclavian arteries.    CT Coronary 08/23/21: IMPRESSION: 1. Coronary calcium score of 387. This was 88th percentile for age and sex matched control. 2.  Nonobstructive CAD 3. Calcified plaque in the proximal and mid LAD causes mild (25-49%) stenosis 4. Calcified plaque in the proximal OM1 causes minimal (0-24%) stenosis. Calcified plaque in OM2 (small vessel, <63mm) causes mild (25-49%) stenosis. 5. Calcified plaque in proximal portion of small, nondominant RCA causes mild (25-49%) stenosis - CAD-RADS 2. Mild non-obstructive CAD (25-49%). Consider non-atherosclerotic causes of chest pain. Consider preventive therapy and risk factor modification.    Echo 08/05/15: Study Conclusions  - Left ventricle: The cavity size was normal. Systolic function was    normal. The estimated ejection fraction was 55%. Wall motion was    normal; there were no regional wall motion abnormalities.  - Aortic valve: There was trivial regurgitation.  - Left atrium: The atrium was mildly dilated.  - Atrial septum: No defect or patent foramen ovale was identified.    Past Medical History:  Diagnosis Date   Anxiety    Arthritis    Depression    GERD (gastroesophageal reflux disease)    Headache    Heart murmur    as child   History of MRSA infection    toe on right foot   Hyperlipidemia    Hypertension    Sleep apnea    no CPAP    Past Surgical History:  Procedure Laterality Date   ankle re construction  11/27/2009    bilateral   APPENDECTOMY  11/28/1979   exploratory surgery d/t appendix bursting    MEDICATIONS:  amLODipine (NORVASC) 10 MG tablet   diazepam (VALIUM) 10 MG tablet   Evolocumab (REPATHA SURECLICK) [id number] MG/ML SOAJ   lisinopril (ZESTRIL) 20 MG tablet   tadalafil (CIALIS) 20 MG tablet   traMADol (ULTRAM) 50 MG tablet   zolpidem (AMBIEN) 10 MG tablet   No current facility-administered medications for this encounter.    Myra Gianotti, PA-C Surgical Short Stay/Anesthesiology [facility] Phone [phone] [facility] Phone [phone] 02/22/2023 3:37 PM

## 2023-02-28 ENCOUNTER — Ambulatory Visit (HOSPITAL_COMMUNITY): Payer: BC Managed Care – PPO

## 2023-02-28 ENCOUNTER — Ambulatory Visit (HOSPITAL_COMMUNITY): Payer: BC Managed Care – PPO | Admitting: Registered Nurse

## 2023-02-28 ENCOUNTER — Encounter (HOSPITAL_COMMUNITY): Payer: Self-pay | Admitting: Neurosurgery

## 2023-02-28 ENCOUNTER — Other Ambulatory Visit: Payer: Self-pay

## 2023-02-28 ENCOUNTER — Ambulatory Visit (HOSPITAL_COMMUNITY)
Admission: RE | Disposition: A | Discharge status: Home / Self Care | Payer: Self-pay | Source: Home / Self Care | Attending: Neurosurgery

## 2023-02-28 ENCOUNTER — Ambulatory Visit (HOSPITAL_COMMUNITY)
Admission: RE | Admit: 2023-02-28 | Discharge: 2023-03-01 | Disposition: A | Discharge status: Home / Self Care | Payer: BC Managed Care – PPO | Attending: Neurosurgery | Admitting: Neurosurgery

## 2023-02-28 ENCOUNTER — Ambulatory Visit (HOSPITAL_COMMUNITY): Payer: BC Managed Care – PPO | Admitting: Vascular Surgery

## 2023-02-28 DIAGNOSIS — G4730 Sleep apnea, unspecified: Secondary | ICD-10-CM | POA: Insufficient documentation

## 2023-02-28 DIAGNOSIS — M4722 Other spondylosis with radiculopathy, cervical region: Secondary | ICD-10-CM | POA: Diagnosis not present

## 2023-02-28 DIAGNOSIS — M4802 Spinal stenosis, cervical region: Secondary | ICD-10-CM | POA: Diagnosis not present

## 2023-02-28 DIAGNOSIS — I10 Essential (primary) hypertension: Secondary | ICD-10-CM | POA: Insufficient documentation

## 2023-02-28 DIAGNOSIS — F418 Other specified anxiety disorders: Secondary | ICD-10-CM | POA: Insufficient documentation

## 2023-02-28 DIAGNOSIS — M1990 Unspecified osteoarthritis, unspecified site: Secondary | ICD-10-CM | POA: Insufficient documentation

## 2023-02-28 DIAGNOSIS — Z981 Arthrodesis status: Secondary | ICD-10-CM | POA: Diagnosis not present

## 2023-02-28 DIAGNOSIS — M4712 Other spondylosis with myelopathy, cervical region: Secondary | ICD-10-CM | POA: Diagnosis present

## 2023-02-28 HISTORY — PX: ANTERIOR CERVICAL DECOMP/DISCECTOMY FUSION: SHX1161

## 2023-02-28 LAB — ABO/RH: ABO/RH(D): A NEG

## 2023-02-28 SURGERY — ANTERIOR CERVICAL DECOMPRESSION/DISCECTOMY FUSION 3 LEVELS
Anesthesia: General | Site: Spine Cervical

## 2023-02-28 MED ORDER — KETAMINE HCL 10 MG/ML IJ SOLN
INTRAMUSCULAR | Status: DC | PRN
  Administered 2023-02-28: 10 mg via INTRAVENOUS
  Administered 2023-02-28: 20 mg via INTRAVENOUS

## 2023-02-28 MED ORDER — LISINOPRIL 20 MG PO TABS
20.0000 mg | ORAL_TABLET | Freq: Every day | ORAL | Status: DC
  Filled 2023-02-28: qty 1

## 2023-02-28 MED ORDER — DEXAMETHASONE SODIUM PHOSPHATE 4 MG/ML IJ SOLN
4.0000 mg | Freq: Four times a day (QID) | INTRAMUSCULAR | Status: AC
  Administered 2023-02-28 (×2): 4 mg via INTRAVENOUS
  Filled 2023-02-28 (×2): qty 1

## 2023-02-28 MED ORDER — CYCLOBENZAPRINE HCL 10 MG PO TABS
10.0000 mg | ORAL_TABLET | Freq: Three times a day (TID) | ORAL | Status: DC | PRN
  Administered 2023-02-28 – 2023-03-01 (×2): 10 mg via ORAL
  Filled 2023-02-28 (×2): qty 1

## 2023-02-28 MED ORDER — DOCUSATE SODIUM 100 MG PO CAPS
100.0000 mg | ORAL_CAPSULE | Freq: Two times a day (BID) | ORAL | Status: DC
  Administered 2023-02-28 – 2023-03-01 (×2): 100 mg via ORAL
  Filled 2023-02-28 (×2): qty 1

## 2023-02-28 MED ORDER — KETAMINE HCL 50 MG/5ML IJ SOSY
PREFILLED_SYRINGE | INTRAMUSCULAR | Status: AC
  Filled 2023-02-28: qty 5

## 2023-02-28 MED ORDER — ACETAMINOPHEN 325 MG PO TABS: 650.0000 mg | ORAL_TABLET | ORAL | Status: DC | PRN

## 2023-02-28 MED ORDER — PROPOFOL 10 MG/ML IV BOLUS
INTRAVENOUS | Status: DC | PRN
  Administered 2023-02-28: 160 mg via INTRAVENOUS
  Administered 2023-02-28: 50 ug/kg/min via INTRAVENOUS

## 2023-02-28 MED ORDER — DEXAMETHASONE SODIUM PHOSPHATE 10 MG/ML IJ SOLN
INTRAMUSCULAR | Status: AC
  Filled 2023-02-28: qty 1

## 2023-02-28 MED ORDER — 0.9 % SODIUM CHLORIDE (POUR BTL) OPTIME
TOPICAL | Status: DC | PRN
  Administered 2023-02-28: 1000 mL

## 2023-02-28 MED ORDER — SUGAMMADEX SODIUM 500 MG/5ML IV SOLN
INTRAVENOUS | Status: AC
  Filled 2023-02-28: qty 5

## 2023-02-28 MED ORDER — FENTANYL CITRATE (PF) 250 MCG/5ML IJ SOLN
INTRAMUSCULAR | Status: AC
  Filled 2023-02-28: qty 5

## 2023-02-28 MED ORDER — DEXMEDETOMIDINE HCL IN NACL 80 MCG/20ML IV SOLN
INTRAVENOUS | Status: DC | PRN
  Administered 2023-02-28: 8 ug via BUCCAL

## 2023-02-28 MED ORDER — ONDANSETRON HCL 4 MG/2ML IJ SOLN: 4.0000 mg | Freq: Four times a day (QID) | INTRAMUSCULAR | Status: DC | PRN

## 2023-02-28 MED ORDER — OXYCODONE HCL 5 MG PO TABS
5.0000 mg | ORAL_TABLET | Freq: Once | ORAL | Status: AC | PRN
  Administered 2023-02-28: 5 mg via ORAL

## 2023-02-28 MED ORDER — BISACODYL 10 MG RE SUPP: 10.0000 mg | Freq: Every day | RECTAL | Status: DC | PRN

## 2023-02-28 MED ORDER — CEFAZOLIN SODIUM-DEXTROSE 2-4 GM/100ML-% IV SOLN
2.0000 g | Freq: Three times a day (TID) | INTRAVENOUS | Status: AC
  Administered 2023-02-28 – 2023-03-01 (×2): 2 g via INTRAVENOUS
  Filled 2023-02-28 (×2): qty 100

## 2023-02-28 MED ORDER — ACETAMINOPHEN 10 MG/ML IV SOLN
INTRAVENOUS | Status: AC
  Filled 2023-02-28: qty 100

## 2023-02-28 MED ORDER — LIDOCAINE 2% (20 MG/ML) 5 ML SYRINGE
INTRAMUSCULAR | Status: DC | PRN
  Administered 2023-02-28: 60 mg via INTRAVENOUS

## 2023-02-28 MED ORDER — PHENYLEPHRINE HCL-NACL 20-0.9 MG/250ML-% IV SOLN
INTRAVENOUS | Status: DC | PRN
  Administered 2023-02-28: 20 ug/min via INTRAVENOUS

## 2023-02-28 MED ORDER — OXYCODONE HCL 5 MG PO TABS: 5.0000 mg | ORAL_TABLET | ORAL | Status: DC | PRN

## 2023-02-28 MED ORDER — MORPHINE SULFATE (PF) 4 MG/ML IV SOLN
4.0000 mg | INTRAVENOUS | Status: DC | PRN
  Administered 2023-02-28 (×2): 4 mg via INTRAVENOUS
  Filled 2023-02-28 (×3): qty 1

## 2023-02-28 MED ORDER — CHLORHEXIDINE GLUCONATE 0.12 % MT SOLN: 15.0000 mL | Freq: Once | OROMUCOSAL | Status: AC

## 2023-02-28 MED ORDER — ACETAMINOPHEN 650 MG RE SUPP: 650.0000 mg | RECTAL | Status: DC | PRN

## 2023-02-28 MED ORDER — CEFAZOLIN SODIUM-DEXTROSE 2-4 GM/100ML-% IV SOLN
2.0000 g | INTRAVENOUS | Status: AC
  Administered 2023-02-28: 2 g via INTRAVENOUS

## 2023-02-28 MED ORDER — DIAZEPAM 5 MG PO TABS
10.0000 mg | ORAL_TABLET | Freq: Three times a day (TID) | ORAL | Status: DC
  Administered 2023-02-28 – 2023-03-01 (×2): 10 mg via ORAL
  Filled 2023-02-28 (×2): qty 2

## 2023-02-28 MED ORDER — CHLORHEXIDINE GLUCONATE 0.12 % MT SOLN
OROMUCOSAL | Status: AC
  Administered 2023-02-28: 15 mL via OROMUCOSAL
  Filled 2023-02-28: qty 15

## 2023-02-28 MED ORDER — ROCURONIUM BROMIDE 10 MG/ML (PF) SYRINGE
PREFILLED_SYRINGE | INTRAVENOUS | Status: AC
  Filled 2023-02-28: qty 10

## 2023-02-28 MED ORDER — ONDANSETRON HCL 4 MG/2ML IJ SOLN
INTRAMUSCULAR | Status: AC
  Filled 2023-02-28: qty 2

## 2023-02-28 MED ORDER — DEXAMETHASONE SODIUM PHOSPHATE 10 MG/ML IJ SOLN
INTRAMUSCULAR | Status: DC | PRN
  Administered 2023-02-28: 10 mg via INTRAVENOUS

## 2023-02-28 MED ORDER — ACETAMINOPHEN 500 MG PO TABS: 1000.0000 mg | ORAL_TABLET | Freq: Once | ORAL | Status: DC | PRN

## 2023-02-28 MED ORDER — FENTANYL CITRATE (PF) 100 MCG/2ML IJ SOLN
INTRAMUSCULAR | Status: AC
  Filled 2023-02-28: qty 2

## 2023-02-28 MED ORDER — FENTANYL CITRATE (PF) 250 MCG/5ML IJ SOLN
INTRAMUSCULAR | Status: DC | PRN
  Administered 2023-02-28: 50 ug via INTRAVENOUS
  Administered 2023-02-28: 100 ug via INTRAVENOUS

## 2023-02-28 MED ORDER — LACTATED RINGERS IV SOLN: INTRAVENOUS | Status: DC

## 2023-02-28 MED ORDER — BUPIVACAINE-EPINEPHRINE (PF) 0.5% -1:200000 IJ SOLN
INTRAMUSCULAR | Status: AC
  Filled 2023-02-28: qty 30

## 2023-02-28 MED ORDER — LIDOCAINE 2% (20 MG/ML) 5 ML SYRINGE
INTRAMUSCULAR | Status: AC
  Filled 2023-02-28: qty 5

## 2023-02-28 MED ORDER — OXYCODONE HCL 5 MG PO TABS
ORAL_TABLET | ORAL | Status: AC
  Filled 2023-02-28: qty 1

## 2023-02-28 MED ORDER — ONDANSETRON HCL 4 MG/2ML IJ SOLN
INTRAMUSCULAR | Status: DC | PRN
  Administered 2023-02-28: 4 mg via INTRAVENOUS

## 2023-02-28 MED ORDER — BACITRACIN ZINC 500 UNIT/GM EX OINT
TOPICAL_OINTMENT | CUTANEOUS | Status: DC | PRN
  Administered 2023-02-28: 1 via TOPICAL

## 2023-02-28 MED ORDER — CHLORHEXIDINE GLUCONATE CLOTH 2 % EX PADS: 6.0000 | MEDICATED_PAD | Freq: Once | CUTANEOUS | Status: DC

## 2023-02-28 MED ORDER — ACETAMINOPHEN 500 MG PO TABS
1000.0000 mg | ORAL_TABLET | Freq: Four times a day (QID) | ORAL | Status: DC
  Administered 2023-02-28 – 2023-03-01 (×2): 1000 mg via ORAL
  Filled 2023-02-28 (×2): qty 2

## 2023-02-28 MED ORDER — THROMBIN 5000 UNITS EX SOLR: OROMUCOSAL | Status: DC | PRN

## 2023-02-28 MED ORDER — FENTANYL CITRATE (PF) 100 MCG/2ML IJ SOLN
25.0000 ug | INTRAMUSCULAR | Status: DC | PRN
  Administered 2023-02-28 (×4): 50 ug via INTRAVENOUS

## 2023-02-28 MED ORDER — ACETAMINOPHEN 10 MG/ML IV SOLN
1000.0000 mg | Freq: Once | INTRAVENOUS | Status: DC | PRN
  Administered 2023-02-28: 1000 mg via INTRAVENOUS

## 2023-02-28 MED ORDER — OXYCODONE HCL 5 MG PO TABS
10.0000 mg | ORAL_TABLET | ORAL | Status: DC | PRN
  Administered 2023-02-28 – 2023-03-01 (×5): 10 mg via ORAL
  Filled 2023-02-28 (×5): qty 2

## 2023-02-28 MED ORDER — MIDAZOLAM HCL 2 MG/2ML IJ SOLN
INTRAMUSCULAR | Status: DC | PRN
  Administered 2023-02-28: 2 mg via INTRAVENOUS

## 2023-02-28 MED ORDER — ORAL CARE MOUTH RINSE: 15.0000 mL | Freq: Once | OROMUCOSAL | Status: AC

## 2023-02-28 MED ORDER — ONDANSETRON HCL 4 MG PO TABS: 4.0000 mg | ORAL_TABLET | Freq: Four times a day (QID) | ORAL | Status: DC | PRN

## 2023-02-28 MED ORDER — ACETAMINOPHEN 160 MG/5ML PO SOLN: 1000.0000 mg | Freq: Once | ORAL | Status: DC | PRN

## 2023-02-28 MED ORDER — AMLODIPINE BESYLATE 10 MG PO TABS
10.0000 mg | ORAL_TABLET | Freq: Every day | ORAL | Status: DC
  Administered 2023-03-01: 10 mg via ORAL
  Filled 2023-02-28: qty 1

## 2023-02-28 MED ORDER — ROCURONIUM BROMIDE 10 MG/ML (PF) SYRINGE
PREFILLED_SYRINGE | INTRAVENOUS | Status: AC
  Filled 2023-02-28: qty 20

## 2023-02-28 MED ORDER — THROMBIN 5000 UNITS EX SOLR
CUTANEOUS | Status: AC
  Filled 2023-02-28: qty 5000

## 2023-02-28 MED ORDER — TRAMADOL HCL 50 MG PO TABS
50.0000 mg | ORAL_TABLET | Freq: Three times a day (TID) | ORAL | Status: DC | PRN
  Administered 2023-03-01: 100 mg via ORAL
  Filled 2023-02-28: qty 2

## 2023-02-28 MED ORDER — MIDAZOLAM HCL 2 MG/2ML IJ SOLN
INTRAMUSCULAR | Status: AC
  Filled 2023-02-28: qty 2

## 2023-02-28 MED ORDER — OXYCODONE HCL 5 MG/5ML PO SOLN: 5.0000 mg | Freq: Once | ORAL | Status: AC | PRN

## 2023-02-28 MED ORDER — DEXAMETHASONE 4 MG PO TABS: 4.0000 mg | ORAL_TABLET | Freq: Four times a day (QID) | ORAL | Status: AC

## 2023-02-28 MED ORDER — SUGAMMADEX SODIUM 200 MG/2ML IV SOLN
INTRAVENOUS | Status: DC | PRN
  Administered 2023-02-28: 200 mg via INTRAVENOUS

## 2023-02-28 MED ORDER — CEFAZOLIN SODIUM-DEXTROSE 2-4 GM/100ML-% IV SOLN
INTRAVENOUS | Status: AC
  Filled 2023-02-28: qty 100

## 2023-02-28 MED ORDER — PANTOPRAZOLE SODIUM 40 MG IV SOLR
40.0000 mg | Freq: Every day | INTRAVENOUS | Status: DC
  Administered 2023-02-28: 40 mg via INTRAVENOUS
  Filled 2023-02-28: qty 10

## 2023-02-28 MED ORDER — ROCURONIUM BROMIDE 10 MG/ML (PF) SYRINGE
PREFILLED_SYRINGE | INTRAVENOUS | Status: DC | PRN
  Administered 2023-02-28: 20 mg via INTRAVENOUS
  Administered 2023-02-28: 10 mg via INTRAVENOUS
  Administered 2023-02-28: 70 mg via INTRAVENOUS
  Administered 2023-02-28: 10 mg via INTRAVENOUS

## 2023-02-28 MED ORDER — PHENOL 1.4 % MT LIQD
1.0000 | OROMUCOSAL | Status: DC | PRN
  Filled 2023-02-28: qty 177

## 2023-02-28 MED ORDER — BACITRACIN ZINC 500 UNIT/GM EX OINT
TOPICAL_OINTMENT | CUTANEOUS | Status: AC
  Filled 2023-02-28: qty 28.35

## 2023-02-28 MED ORDER — ALUM & MAG HYDROXIDE-SIMETH 200-200-20 MG/5ML PO SUSP: 30.0000 mL | Freq: Four times a day (QID) | ORAL | Status: DC | PRN

## 2023-02-28 MED ORDER — BUPIVACAINE-EPINEPHRINE (PF) 0.5% -1:200000 IJ SOLN
INTRAMUSCULAR | Status: DC | PRN
  Administered 2023-02-28: 10 mL

## 2023-02-28 MED ORDER — MENTHOL 3 MG MT LOZG: 1.0000 | LOZENGE | OROMUCOSAL | Status: DC | PRN

## 2023-02-28 SURGICAL SUPPLY — 60 items
APL SKNCLS STERI-STRIP NONHPOA (GAUZE/BANDAGES/DRESSINGS) ×1
BAG COUNTER SPONGE SURGICOUNT (BAG) ×1 IMPLANT
BAG SPNG CNTER NS LX DISP (BAG) ×1
BAND INSRT 18 STRL LF DISP RB (MISCELLANEOUS)
BAND RUBBER #18 3X1/16 STRL (MISCELLANEOUS) IMPLANT
BENZOIN TINCTURE PRP APPL 2/3 (GAUZE/BANDAGES/DRESSINGS) ×2 IMPLANT
BIT DRILL NEURO 2X3.1 SFT TUCH (MISCELLANEOUS) ×1 IMPLANT
BLADE SURG 15 STRL LF DISP TIS (BLADE) ×1 IMPLANT
BLADE SURG 15 STRL SS (BLADE) ×1
BLADE ULTRA TIP 2M (BLADE) ×1 IMPLANT
BUR BARREL STRAIGHT FLUTE 4.0 (BURR) ×1 IMPLANT
BUR MATCHSTICK NEURO 3.0 LAGG (BURR) ×1 IMPLANT
CANISTER SUCT 3000ML PPV (MISCELLANEOUS) ×1 IMPLANT
COVER MAYO STAND STRL (DRAPES) ×1 IMPLANT
DRAIN JACKSON PRATT 10MM FLAT (MISCELLANEOUS) IMPLANT
DRAPE LAPAROTOMY 100X72 PEDS (DRAPES) ×1 IMPLANT
DRAPE MICROSCOPE SLANT 54X150 (MISCELLANEOUS) IMPLANT
DRAPE SURG 17X23 STRL (DRAPES) ×2 IMPLANT
DRILL NEURO 2X3.1 SOFT TOUCH (MISCELLANEOUS) ×1
DRSG OPSITE POSTOP 3X4 (GAUZE/BANDAGES/DRESSINGS) ×1 IMPLANT
DRSG OPSITE POSTOP 4X6 (GAUZE/BANDAGES/DRESSINGS) IMPLANT
ELECT REM PT RETURN 9FT ADLT (ELECTROSURGICAL) ×1
ELECTRODE REM PT RTRN 9FT ADLT (ELECTROSURGICAL) ×1 IMPLANT
EVACUATOR SILICONE 100CC (DRAIN) IMPLANT
GAUZE 4X4 16PLY ~~LOC~~+RFID DBL (SPONGE) IMPLANT
GLOVE BIO SURGEON STRL SZ 6.5 (GLOVE) ×1 IMPLANT
GLOVE BIO SURGEON STRL SZ8 (GLOVE) ×1 IMPLANT
GLOVE BIO SURGEON STRL SZ8.5 (GLOVE) ×1 IMPLANT
GLOVE BIOGEL PI IND STRL 6.5 (GLOVE) ×1 IMPLANT
GLOVE EXAM NITRILE XL STR (GLOVE) IMPLANT
GOWN STRL REUS W/ TWL LRG LVL3 (GOWN DISPOSABLE) ×1 IMPLANT
GOWN STRL REUS W/ TWL XL LVL3 (GOWN DISPOSABLE) ×1 IMPLANT
GOWN STRL REUS W/TWL LRG LVL3 (GOWN DISPOSABLE) ×1
GOWN STRL REUS W/TWL XL LVL3 (GOWN DISPOSABLE) ×1
HEMOSTAT POWDER KIT SURGIFOAM (HEMOSTASIS) ×1 IMPLANT
KIT BASIN OR (CUSTOM PROCEDURE TRAY) ×1 IMPLANT
KIT TURNOVER KIT B (KITS) ×1 IMPLANT
MARKER SKIN DUAL TIP RULER LAB (MISCELLANEOUS) ×1 IMPLANT
NDL SPNL 18GX3.5 QUINCKE PK (NEEDLE) ×1 IMPLANT
NEEDLE HYPO 22GX1.5 SAFETY (NEEDLE) ×1 IMPLANT
NEEDLE SPNL 18GX3.5 QUINCKE PK (NEEDLE) ×1 IMPLANT
NS IRRIG 1000ML POUR BTL (IV SOLUTION) ×1 IMPLANT
PACK LAMINECTOMY NEURO (CUSTOM PROCEDURE TRAY) ×1 IMPLANT
PATTIES SURGICAL 1X1 (DISPOSABLE) IMPLANT
PEEK VISTA 14X14X7MM (Peek) IMPLANT
PIN DISTRACTION 14MM (PIN) ×2 IMPLANT
PLATE ANT CERV XTEND 3 LV 45 (Plate) IMPLANT
PUTTY DBM 5CC CALC GRAN (Putty) IMPLANT
SCREW XTD VAR 4.2 SELF TAP (Screw) IMPLANT
SOL ELECTROSURG ANTI STICK (MISCELLANEOUS)
SOLUTION ELECTROSURG ANTI STCK (MISCELLANEOUS) ×1 IMPLANT
SPONGE INTESTINAL PEANUT (DISPOSABLE) ×2 IMPLANT
SPONGE SURGIFOAM ABS GEL 100 (HEMOSTASIS) IMPLANT
STRIP CLOSURE SKIN 1/2X4 (GAUZE/BANDAGES/DRESSINGS) ×1 IMPLANT
SUT VIC AB 0 CT1 27 (SUTURE) ×1
SUT VIC AB 0 CT1 27XBRD ANTBC (SUTURE) ×1 IMPLANT
SUT VIC AB 3-0 SH 8-18 (SUTURE) ×1 IMPLANT
TOWEL GREEN STERILE (TOWEL DISPOSABLE) ×1 IMPLANT
TOWEL GREEN STERILE FF (TOWEL DISPOSABLE) ×1 IMPLANT
WATER STERILE IRR 1000ML POUR (IV SOLUTION) ×1 IMPLANT

## 2023-02-28 NOTE — H&P (Signed)
Subjective: The patient is a 62 year old white male who has complained of neck and arm pain numbness tingling, right greater than left.  He has failed medical management.  He was worked up with a cervical MRI which demonstrated spondylosis and spinal and foraminal stenosis most prominent at C3-4, C4-5 and C5-6.  I discussed the various treatment options with him.  He has decided proceed with surgery.  Past Medical History:  Diagnosis Date   Anxiety    Arthritis    Depression    GERD (gastroesophageal reflux disease)    Headache    Heart murmur    as child   History of MRSA infection    toe on right foot   Hyperlipidemia    Hypertension    Sleep apnea    no CPAP    Past Surgical History:  Procedure Laterality Date   ankle re construction  11/27/2009   bilateral   APPENDECTOMY  11/28/1979   exploratory surgery d/t appendix bursting    No Known Allergies  Social History   Tobacco Use   Smoking status: Never   Smokeless tobacco: Never  Substance Use Topics   Alcohol use: Yes    Alcohol/week: 2.0 standard drinks of alcohol    Types: 2 Glasses of wine per week    Family History  Problem Relation Age of Onset   Esophageal cancer Mother    Heart attack Father        Died of MI at 52   Mental illness Father    Healthy Sister    Healthy Sister    Heart disease Maternal Grandfather        MI/CABG   Heart attack Maternal Grandfather    Colon cancer Neg Hx    Rectal cancer Neg Hx    Stomach cancer Neg Hx    Prior to Admission medications   Medication Sig Start Date End Date Taking? Authorizing Provider  amLODipine (NORVASC) 10 MG tablet Take 1 tablet (10 mg total) by mouth daily. 06/01/22  Yes Hilty, Nadean Corwin, MD  diazepam (VALIUM) 10 MG tablet Take 1 tablet (10 mg total) by mouth 3 (three) times daily. 02/08/23  Yes [name] R, DO  lisinopril (ZESTRIL) 20 MG tablet Take 1 tablet (20 mg total) by mouth daily. 12/25/22  Yes [name] R, DO  tadalafil  (CIALIS) 20 MG tablet Take 1 tablet (20 mg total) by mouth as needed. 10/09/22  Yes [name], DO  traMADol (ULTRAM) 50 MG tablet 1 po q8h prn 02/07/23  Yes Lowne Chase, Kendrick Fries R, DO  zolpidem (AMBIEN) 10 MG tablet TAKE 1 TABLET BY MOUTH EVERY DAY AT BEDTIME AS NEEDED FOR SLEEP 02/07/23  Yes Lowne Chase, Yvonne R, DO  Evolocumab (REPATHA SURECLICK) [id number] MG/ML SOAJ Inject 1 Dose into the skin every 14 (fourteen) days. 06/01/22   Pixie Casino, MD     Review of Systems  Positive ROS: As above  All other systems have been reviewed and were otherwise negative with the exception of those mentioned in the HPI and as above.  Objective: Vital signs in last 24 hours: Temp:  [97.9 F (36.6 C)] 97.9 F (36.6 C) (04/03 1118) Pulse Rate:  [84] 84 (04/03 1118) Resp:  [18] 18 (04/03 1118) BP: (136)/(83) 136/83 (04/03 1118) SpO2:  [95 %] 95 % (04/03 1118) Weight:  [90.7 kg] 90.7 kg (04/03 1118) Estimated body mass index is 28.7 kg/m as calculated from the following:   Height as of  this encounter: 5\' 10"  (1.778 m).   Weight as of this encounter: 90.7 kg.   General Appearance: Alert Head: Normocephalic, without obvious abnormality, atraumatic Eyes: PERRL, conjunctiva/corneas clear, EOM's intact,    Ears: Normal  Throat: Normal  Neck: The mid cervical range of motion.  Positive Spurling's testing. Back: unremarkable Lungs: Clear to auscultation bilaterally, respirations unlabored Heart: Regular rate and rhythm, no murmur, rub or gallop Abdomen: Soft, non-tender Extremities: Extremities normal, atraumatic, no cyanosis or edema Skin: unremarkable  NEUROLOGIC:   Mental status: alert and oriented,Motor Exam - grossly normal Sensory Exam - grossly normal Reflexes:  Coordination - grossly normal Gait - grossly normal Balance - grossly normal Cranial Nerves: I: smell Not tested  II: visual acuity  OS: Normal  OD: Normal   II: visual fields Full to confrontation  II: pupils Equal,  round, reactive to light  III,VII: ptosis None  III,IV,VI: extraocular muscles  Full ROM  V: mastication Normal  V: facial light touch sensation  Normal  V,VII: corneal reflex  Present  VII: facial muscle function - upper  Normal  VII: facial muscle function - lower Normal  VIII: hearing Not tested  IX: soft palate elevation  Normal  IX,X: gag reflex Present  XI: trapezius strength  5/5  XI: sternocleidomastoid strength 5/5  XI: neck flexion strength  5/5  XII: tongue strength  Normal    Data Review Lab Results  Component Value Date   WBC 5.6 02/21/2023   HGB 15.7 02/21/2023   HCT 46.0 02/21/2023   MCV 91.5 02/21/2023   PLT 238 02/21/2023   Lab Results  Component Value Date   NA 137 02/21/2023   K 4.5 02/21/2023   CL 104 02/21/2023   CO2 25 02/21/2023   BUN 16 02/21/2023   CREATININE 1.20 02/21/2023   GLUCOSE 103 (H) 02/21/2023   No results found for: "INR", "PROTIME"  Assessment/Plan: Cervical spondylosis, cervical radiculopathy, cervical myelopathy, cervicalgia: I have discussed situation with the patient.  I have reviewed his MRI scan with him and pointed out the abnormalities.  We have discussed the various treatment options including surgery.  I have described the surgical treatment option of a C3-4, C4-5 and C5-6 anterior cervicectomy fusion and plating.  I have shown him surgical models.  I have given him a surgical pamphlet.  We have discussed the risk, benefits, alternatives, expected postop course, and likelihood of achieving our goals with surgery.  I have answered all his questions.  He has decided proceed with surgery.   Ophelia Charter 02/28/2023 12:50 PM

## 2023-02-28 NOTE — Progress Notes (Signed)
Orthopedic Tech Progress Note Patient Details:  Jason Williams 04/24/1961 [id number] Aspen cervical collar was delivered to this patient in PACU.  Ortho Devices Type of Ortho Device: Aspen cervical collar Ortho Device/Splint Location: Neck Ortho Device/Splint Interventions: Ordered      Dewana Ammirati E Palyn Scrima 02/28/2023, 6:27 PM

## 2023-02-28 NOTE — Transfer of Care (Signed)
Immediate Anesthesia Transfer of Care Note  Patient: Jason Williams  Procedure(s) Performed: Anterior Cervical Decompression/Discectomy Fusion, Interbody Prosthesis, Plate/Screws Cervical Three-Cervical Four, Cervical Four-Cervical Five, Cervical Five- Cervical Six (Spine Cervical)  Patient Location: PACU  Anesthesia Type:General  Level of Consciousness: awake and drowsy  Airway & Oxygen Therapy: Patient Spontanous Breathing and Patient connected to face mask oxygen  Post-op Assessment: Report given to RN and Post -op Vital signs reviewed and stable  Post vital signs: Reviewed and stable  Last Vitals:  Vitals Value Taken Time  BP 120/80 02/28/23 1710  Temp    Pulse 82 02/28/23 1715  Resp 17 02/28/23 1715  SpO2 96 % 02/28/23 1715  Vitals shown include unvalidated device data.  Last Pain:  Vitals:   02/28/23 1139  TempSrc:   PainSc: 7          Complications: No notable events documented.

## 2023-02-28 NOTE — Op Note (Signed)
Brief history: The patient is a 62 year old-year-old white male who has complained of neck and bilateral arm pain.  He has failed medical management and was worked up with a cervical MRI which demonstrated spondylosis, stenosis at C3-4, C4-5 and C5-6.  I discussed the various treatment options with him.  He has decided to proceed with surgery.  Preoperative diagnosis: C3-4, C4-5 and C5-6 spondylosis, stenosis, neuroforaminal stenosis, cervical radiculopathy, cervical myelopathy, cervicalgia  Postoperative diagnosis: The same  Procedure: C3-4, C4-5 and C5-6 anterior cervical discectomy/decompression; C3-4, C4-5 and C5-6 interbody arthrodesis with local morcellized autograft bone and Zimmer DBM; insertion of interbody prosthesis at C3-4, C4-5 and C5-6 (Zimmer peek interbody prosthesis); anterior cervical plating from C3-C6 with globus titanium plate  Surgeon: Dr. Earle Gell  Asst.: Dr. Ashok Pall and Arnetha Massy, NP  Anesthesia: Gen. endotracheal  Estimated blood loss: 150 cc  Drains: None  Complications: None  Description of procedure: The patient was brought to the operating room by the anesthesia team. General endotracheal anesthesia was induced. A roll was placed under the patient's shoulders to keep the neck in the neutral position. The patient's anterior cervical region was then prepared with Betadine scrub and Betadine solution. Sterile drapes were applied.  The area to be incised was then injected with Marcaine with epinephrine solution. I then used a scalpel to make a transverse incision in the patient's left anterior neck. I used the Metzenbaum scissors to divide the platysmal muscle and then to dissect medial to the sternocleidomastoid muscle, jugular vein, and carotid artery. I carefully dissected down towards the anterior cervical spine identifying the esophagus and retracting it medially. Then using Kitner swabs to clear soft tissue from the anterior cervical spine. We then  inserted a bent spinal needle into the upper exposed intervertebral disc space. We then obtained intraoperative radiographs confirm our location.  I then used electrocautery to detach the medial border of the longus colli muscle bilaterally from the C3-4, C4-5 and C5-6 intervertebral disc spaces. I then inserted the Caspar self-retaining retractor underneath the longus colli muscle bilaterally to provide exposure.  We then incised the intervertebral disc at C3-4. We then performed a partial intervertebral discectomy with a pituitary forceps and the Karlin curettes. I then inserted distraction screws into the vertebral bodies at C3-4. We then distracted the interspace. We then used the high-speed drill to decorticate the vertebral endplates at 075-GRM, to drill away the remainder of the intervertebral disc, to drill away some posterior spondylosis, and to thin out the posterior longitudinal ligament. I then incised ligament with the arachnoid knife. We then removed the ligament with a Kerrison punches undercutting the vertebral endplates and decompressing the thecal sac. We then performed foraminotomies about the bilateral C4 nerve roots. This completed the decompression at this level.  We then repeated his procedure in analogous fashion at C4-5 and C5-6 decompressing the thecal sac at about C5 and C6 nerve roots.  We now turned our to attention to the interbody fusion. We used the trial spacers to determine the appropriate size for the interbody prosthesis. We then pre-filled prosthesis with a combination of local morcellized autograft bone that we obtained during decompression as well as Zimmer DBM. We then inserted the prosthesis into the distracted interspace at C3-4, C4-5 and C5-6. We then removed the distraction screws. There was a good snug fit of the prosthesis in the interspace.  Having completed the fusion we now turned attention to the anterior spinal instrumentation. We used the high-speed drill to  drill  away some anterior spondylosis at the disc spaces so that the plate lay down flat. We selected the appropriate length titanium anterior cervical plate. We laid it along the anterior aspect of the vertebral bodies from C3-C6. We then drilled 14 mm holes at C3, C4, C5 and C6. We then secured the plate to the vertebral bodies by placing two 14 mm self-tapping screws at C3, C4, C5 and C6. We then obtained intraoperative radiograph. The demonstrating good position of the instrumentation. We therefore secured the screws the plate the locking each cam. This completed the instrumentation.  We then obtained hemostasis using bipolar electrocautery. We irrigated the wound out with saline solution. We then removed the retractor. We inspected the esophagus for any damage. There was none apparent. We then reapproximated patient's platysmal muscle with interrupted 3-0 Vicryl suture. We then reapproximated the subcutaneous tissue with interrupted 3-0 Vicryl suture. The skin was reapproximated with Steri-Strips and benzoin. The wound was then covered with bacitracin ointment. A sterile dressing was applied. The drapes were removed. Patient was subsequently extubated by the anesthesia team and transported to the post anesthesia care unit in stable condition. All sponge instrument and needle counts were reportedly correct at the end of this case.

## 2023-03-01 DIAGNOSIS — G4730 Sleep apnea, unspecified: Secondary | ICD-10-CM | POA: Diagnosis not present

## 2023-03-01 DIAGNOSIS — M1990 Unspecified osteoarthritis, unspecified site: Secondary | ICD-10-CM | POA: Diagnosis not present

## 2023-03-01 DIAGNOSIS — M4722 Other spondylosis with radiculopathy, cervical region: Secondary | ICD-10-CM | POA: Diagnosis not present

## 2023-03-01 DIAGNOSIS — I10 Essential (primary) hypertension: Secondary | ICD-10-CM | POA: Diagnosis not present

## 2023-03-01 DIAGNOSIS — M4802 Spinal stenosis, cervical region: Secondary | ICD-10-CM | POA: Diagnosis not present

## 2023-03-01 DIAGNOSIS — F418 Other specified anxiety disorders: Secondary | ICD-10-CM | POA: Diagnosis not present

## 2023-03-01 DIAGNOSIS — M4712 Other spondylosis with myelopathy, cervical region: Secondary | ICD-10-CM | POA: Diagnosis not present

## 2023-03-01 MED ORDER — CYCLOBENZAPRINE HCL 10 MG PO TABS: 10.0000 mg | ORAL_TABLET | Freq: Three times a day (TID) | ORAL | 0 refills | Status: DC | PRN

## 2023-03-01 MED ORDER — OXYCODONE-ACETAMINOPHEN 5-325 MG PO TABS: 1.0000 | ORAL_TABLET | ORAL | 0 refills | Status: DC | PRN

## 2023-03-01 MED ORDER — ONDANSETRON HCL 4 MG PO TABS: 4.0000 mg | ORAL_TABLET | Freq: Four times a day (QID) | ORAL | 0 refills | Status: DC | PRN

## 2023-03-01 MED ORDER — DOCUSATE SODIUM 100 MG PO CAPS: 100.0000 mg | ORAL_CAPSULE | Freq: Two times a day (BID) | ORAL | 0 refills | Status: DC

## 2023-03-01 MED ORDER — MORPHINE SULFATE (PF) 4 MG/ML IV SOLN
4.0000 mg | Freq: Once | INTRAVENOUS | Status: AC
  Administered 2023-03-01: 4 mg via INTRAMUSCULAR

## 2023-03-01 MED FILL — Thrombin For Soln 5000 Unit: CUTANEOUS | Qty: 5000 | Status: AC

## 2023-03-01 NOTE — Discharge Summary (Signed)
Physician Discharge Summary     Providing Compassionate, Quality Care - Together   Patient ID: Jason Williams MRN: [id number] DOB/AGE: 1961/06/05 62 y.o.  Admit date: 02/28/2023 Discharge date: 03/01/2023  Admission Diagnoses: Cervical spondylosis with myelopathy and radiculopathy  Discharge Diagnoses:  Principal Problem:   Cervical spondylosis with myelopathy and radiculopathy   Discharged Condition: good  Hospital Course: Patient underwent a C3-4, C4-5, C5-6 by Dr. Lovell Sheehan on 02/28/2023. He was admitted to [facility] following recovery from anesthesia in the PACU. His postoperative course has been uncomplicated. He has worked with both physical and occupational therapies who feel the patient is ready for discharge home. He is ambulating independently and without difficulty. He is tolerating a normal diet. He is not having any bowel or bladder dysfunction. His pain is well-controlled with oral pain medication. He is ready for discharge home.   Consults: PT/OT  Significant Diagnostic Studies: radiology: DG Cervical Spine 2 or 3 views  Result Date: 02/28/2023 CLINICAL DATA:  Anterior cervical decompression discectomy and infusion. EXAM: CERVICAL SPINE - 2-3 VIEW COMPARISON:  MRI of the cervical spine January 05, 2023 FINDINGS: Intraoperative images demonstrate placement of anterior plate at C3 through C6, with intervening disc spacers. The alignment is anatomic. Surgical gauze is noted. The patient is intubated. IMPRESSION: Intraoperative images from anterior cervical fusion. Electronically Signed   By: Ted Mcalpine M.D.   On: 02/28/2023 18:11     Treatments: surgery: C3-4, C4-5 and C5-6 anterior cervical discectomy/decompression; C3-4, C4-5 and C5-6 interbody arthrodesis with local morcellized autograft bone and Zimmer DBM; insertion of interbody prosthesis at C3-4, C4-5 and C5-6 (Zimmer peek interbody prosthesis); anterior cervical plating from C3-C6 with globus titanium plate    Discharge Exam: Blood pressure (!) 166/95, pulse 99, temperature 98 F (36.7 C), temperature source Oral, resp. rate 18, height 5\' 10"  (1.778 m), weight 90.7 kg, SpO2 96 %.  Alert and oriented x 4 PERRLA CN II-XII grossly intact MAE, Strength and sensation intact Incision is covered with Honeycomb dressing and Steri Strips; Dressing is clean, dry, and intact   Disposition: Discharge disposition: 01-Home or Self Care       Discharge Instructions     Call MD for:  difficulty breathing, headache or visual disturbances   Complete by: As directed    Call MD for:  hives   Complete by: As directed    Call MD for:  persistant nausea and vomiting   Complete by: As directed    Call MD for:  redness, tenderness, or signs of infection (pain, swelling, redness, odor or green/yellow discharge around incision site)   Complete by: As directed    Call MD for:  severe uncontrolled pain   Complete by: As directed    Call MD for:  temperature >100.4   Complete by: As directed    Diet - low sodium heart healthy   Complete by: As directed    If the dressing is still on your incision site when you go home, remove it on the third day after your surgery date. Remove dressing if it begins to fall off, or if it is dirty or damaged before the third day.   Complete by: As directed    Increase activity slowly   Complete by: As directed       Allergies as of 03/01/2023   No Known Allergies      Medication List     STOP taking these medications    traMADol 50 MG tablet Commonly known as:  ULTRAM       TAKE these medications    amLODipine 10 MG tablet Commonly known as: NORVASC Take 1 tablet (10 mg total) by mouth daily.   cyclobenzaprine 10 MG tablet Commonly known as: FLEXERIL Take 1 tablet (10 mg total) by mouth 3 (three) times daily as needed for muscle spasms.   diazepam 10 MG tablet Commonly known as: VALIUM Take 1 tablet (10 mg total) by mouth 3 (three) times daily.    docusate sodium 100 MG capsule Commonly known as: COLACE Take 1 capsule (100 mg total) by mouth 2 (two) times daily.   lisinopril 20 MG tablet Commonly known as: ZESTRIL Take 1 tablet (20 mg total) by mouth daily.   ondansetron 4 MG tablet Commonly known as: ZOFRAN Take 1 tablet (4 mg total) by mouth every 6 (six) hours as needed for nausea or vomiting.   oxyCODONE-acetaminophen 5-325 MG tablet Commonly known as: Percocet Take 1-2 tablets by mouth every 4 (four) hours as needed for severe pain (Postoperative pain).   Repatha SureClick 140 MG/ML Soaj Generic drug: Evolocumab Inject 1 Dose into the skin every 14 (fourteen) days.   tadalafil 20 MG tablet Commonly known as: CIALIS Take 1 tablet (20 mg total) by mouth as needed.   zolpidem 10 MG tablet Commonly known as: AMBIEN TAKE 1 TABLET BY MOUTH EVERY DAY AT BEDTIME AS NEEDED FOR SLEEP               Discharge Care Instructions  (From admission, onward)           Start     Ordered   03/01/23 0000  If the dressing is still on your incision site when you go home, remove it on the third day after your surgery date. Remove dressing if it begins to fall off, or if it is dirty or damaged before the third day.        03/01/23 3810            Follow-up Information     Tressie Stalker, MD. Go on 03/23/2023.   Specialty: Neurosurgery Why: First post op appointment with x-rays is on 03/23/2023 at 8:15 AM. Contact information: [address] [place] [address] [phone]                 Signed: Val Eagle, DNP, AGNP-C Nurse Practitioner  [facility] [address], [place], [place] [address] P: [phone]    F: [phone]  03/01/2023, 9:56 AM

## 2023-03-01 NOTE — Anesthesia Postprocedure Evaluation (Signed)
Anesthesia Post Note  Patient: Jason Williams  Procedure(s) Performed: Anterior Cervical Decompression/Discectomy Fusion, Interbody Prosthesis, Plate/Screws Cervical Three-Cervical Four, Cervical Four-Cervical Five, Cervical Five- Cervical Six (Spine Cervical)     Patient location during evaluation: PACU Anesthesia Type: General Level of consciousness: awake and alert Pain management: pain level controlled Vital Signs Assessment: post-procedure vital signs reviewed and stable Respiratory status: spontaneous breathing, nonlabored ventilation, respiratory function stable and patient connected to nasal cannula oxygen Cardiovascular status: blood pressure returned to baseline and stable Postop Assessment: no apparent nausea or vomiting Anesthetic complications: no   No notable events documented.  Last Vitals:  Vitals:   03/01/23 0345 03/01/23 0935  BP: (!) 148/86 (!) 166/95  Pulse: 93 99  Resp: 20 18  Temp: 36.5 C 36.7 C  SpO2: 95% 96%    Last Pain:  Vitals:   03/01/23 0935  TempSrc: Oral  PainSc:                  Tiajuana Amass

## 2023-03-01 NOTE — Evaluation (Signed)
Occupational Therapy Evaluation and Discharge Patient Details Name: Jason Williams MRN: [id number] DOB: 1961-09-26 Today's Date: 03/01/2023   History of Present Illness Pt is a 62 year old man admitted on 02/28/23 for C 3-4, C 4-5, C5-6 ACDF. PMH: arthritis, HA, HTN, HLD, sleep apnea, anxiety, depression.   Clinical Impression   All education completed and reinforced with written handout. Pt verbalized understanding of cervical precautions, compensatory strategies, correct position of cervical brace, lifting limitations and IADLs to avoid. Pt will have assist of his significant other upon discharge. No further OT needs.      Recommendations for follow up therapy are one component of a multi-disciplinary discharge planning process, led by the attending physician.  Recommendations may be updated based on patient status, additional functional criteria and insurance authorization.   Assistance Recommended at Discharge Intermittent Supervision/Assistance  Patient can return home with the following      Functional Status Assessment  Patient has had a recent decline in their functional status and demonstrates the ability to make significant improvements in function in a reasonable and predictable amount of time.  Equipment Recommendations  None recommended by OT    Recommendations for Other Services       Precautions / Restrictions Precautions Precautions: Cervical Precaution Booklet Issued: Yes (comment) Required Braces or Orthoses: Cervical Brace Cervical Brace: Hard collar Restrictions Weight Bearing Restrictions: No      Mobility Bed Mobility Overal bed mobility: Modified Independent             General bed mobility comments: instructed in log roll technique    Transfers Overall transfer level: Modified independent                        Balance                                           ADL either performed or assessed with clinical  judgement   ADL Overall ADL's : Modified independent                                       General ADL Comments: Educated pt in cervical precautions, compensatory strategies for ADLs and IADLs to avoid.     Vision Ability to See in Adequate Light: 0 Adequate       Perception     Praxis      Pertinent Vitals/Pain Pain Assessment Pain Assessment: Faces Faces Pain Scale: Hurts whole lot Pain Location: neck Pain Descriptors / Indicators: Aching Pain Intervention(s): Premedicated before session, Repositioned     Hand Dominance Right   Extremity/Trunk Assessment Upper Extremity Assessment Upper Extremity Assessment: Overall WFL for tasks assessed (no formally assessed, pt reports improved sensation vs prior to surgery)   Lower Extremity Assessment Lower Extremity Assessment: Overall WFL for tasks assessed   Cervical / Trunk Assessment Cervical / Trunk Assessment: Neck Surgery   Communication Communication Communication: No difficulties   Cognition Arousal/Alertness: Awake/alert Behavior During Therapy: WFL for tasks assessed/performed Overall Cognitive Status: Within Functional Limits for tasks assessed                                       General Comments  Exercises     Shoulder Instructions      Home Living Family/patient expects to be discharged to:: Private residence Living Arrangements: Spouse/significant other Available Help at Discharge: Friend(s);Available 24 hours/day Type of Home: House Home Access: Stairs to enter [employer] of Steps: 6 Entrance Stairs-Rails: Right;Left Home Layout: Two level;Able to live on main level with bedroom/bathroom               Home Equipment: None          Prior Functioning/Environment Prior Level of Function : Independent/Modified Independent;Working/employed;Driving                        OT Problem List: Pain      OT Treatment/Interventions:       OT Goals(Current goals can be found in the care plan section)    OT Frequency:      Co-evaluation              AM-PAC OT "6 Clicks" Daily Activity     Outcome Measure Help from another person eating meals?: None Help from another person taking care of personal grooming?: None Help from another person toileting, which includes using toliet, bedpan, or urinal?: None Help from another person bathing (including washing, rinsing, drying)?: None Help from another person to put on and taking off regular upper body clothing?: None Help from another person to put on and taking off regular lower body clothing?: None 6 Click Score: 24   End of Session Equipment Utilized During Treatment: Cervical collar  Activity Tolerance: Patient tolerated treatment well Patient left: in bed;with call bell/phone within reach  OT Visit Diagnosis: Pain                Time: [id number] OT Time Calculation (min): 34 min Charges:  OT General Charges $OT Visit: 1 Visit OT Evaluation $OT Eval Low Complexity: 1 Low  Cleta Alberts, OTR/L Acute Rehabilitation Services Office: [phone]   Malka So 03/01/2023, 12:13 PM

## 2023-03-01 NOTE — Plan of Care (Signed)
Pt given D/C instructions with verbal understanding. Rx's were sent to the pharmacy by MD. Pt's incision is clean and dry with no sign of infection. Pt's IV was removed prior to D/C. Pt D/C'd home via wheelchair per MD order. Pt is stable @ D/C and has no other needs at this time. Demetrius Mahler, RN  

## 2023-03-01 NOTE — Discharge Instructions (Signed)
Wound Care Keep incision covered and dry until post op day 3. You may remove the Honeycomb dressing on post op day 3. Leave steri-strips on back.  They will fall off by themselves. Do not put any creams, lotions, or ointments on incision. You are fine to shower. Let water run over incision and pat dry.  Activity Walk each and every day, increasing distance each day. No lifting greater than 5 lbs.  Avoid excessive neck motion. No driving for 2 weeks; may ride as a passenger locally.  Diet Resume your normal diet.   Return to Work Will be discussed at your follow up appointment.  Call Your Doctor If Any of These Occur Redness, drainage, or swelling at the wound.  Temperature greater than 101 degrees. Severe pain not relieved by pain medication. Incision starts to come apart.  Follow Up Appt Call [phone] today for appointment in 2-3 weeks if you don't already have one or for any problems.  If you have any hardware placed in your spine, you will need an x-ray before your appointment.

## 2023-03-02 ENCOUNTER — Encounter (HOSPITAL_COMMUNITY): Payer: Self-pay | Admitting: Neurosurgery

## 2023-03-05 ENCOUNTER — Telehealth: Payer: Self-pay | Admitting: Family Medicine

## 2023-03-05 DIAGNOSIS — G4700 Insomnia, unspecified: Secondary | ICD-10-CM

## 2023-03-05 MED ORDER — ZOLPIDEM TARTRATE 10 MG PO TABS
ORAL_TABLET | ORAL | 0 refills | Status: DC
Start: 2023-03-05 — End: 2023-06-15

## 2023-03-05 NOTE — Telephone Encounter (Signed)
Pt is also requesting a medication called Bensonapaee to help with his throat that is sore due to surgery he had recently. He stated the intubation cause his sore throat. Please advise       Requesting: Ambien Contract: 04/10/2022 UDS: 04/10/2022 Last OV: 12/25/2022 Next OV: N/A Last Refill: 02/07/2023, #90--0 RF Database:   Please advise

## 2023-03-05 NOTE — Addendum Note (Signed)
Addended by: Roxanne Gates on: 03/05/2023 08:36 AM   Modules accepted: Orders

## 2023-03-05 NOTE — Telephone Encounter (Signed)
Prescription Request  03/05/2023  Is this a "Controlled Substance" medicine? Yes  LOV: 12/25/2022  What is the name of the medication or equipment?  zolpidem (AMBIEN) 10 MG tablet   Pt is also requesting a medication called Bensonapaee to help with his throat that is sore due to surgery he had recently. He stated the intubation cause his sore throat. Please advise  Have you contacted your pharmacy to request a refill? No   Which pharmacy would you like this sent to?  [id number] - [place], [place] - [address] AT [address] [place] [address] Phone: [phone] Fax: [phone]   Patient notified that their request is being sent to the clinical staff for review and that they should receive a response within 2 business days.   Please advise at [facility] [phone]

## 2023-03-06 ENCOUNTER — Other Ambulatory Visit: Payer: Self-pay | Admitting: Family Medicine

## 2023-03-06 MED ORDER — BENZONATATE 200 MG PO CAPS: 200.0000 mg | ORAL_CAPSULE | Freq: Two times a day (BID) | ORAL | 0 refills | Status: DC | PRN

## 2023-03-06 NOTE — Telephone Encounter (Signed)
I don't think we sent in this medication. Pt requested medication with Ambien. Please advise

## 2023-03-06 NOTE — Telephone Encounter (Signed)
Pt is still needing benzonatate. His throat is very sore and it's making him cough and gag.  He stated he got a message from his pharmacy they were needing ins approval but he does not think they started a PA.

## 2023-03-07 DIAGNOSIS — M542 Cervicalgia: Secondary | ICD-10-CM | POA: Diagnosis not present

## 2023-03-19 ENCOUNTER — Ambulatory Visit (INDEPENDENT_AMBULATORY_CARE_PROVIDER_SITE_OTHER): Payer: BC Managed Care – PPO | Admitting: Family Medicine

## 2023-03-19 ENCOUNTER — Encounter: Payer: Self-pay | Admitting: Family Medicine

## 2023-03-19 VITALS — BP 120/60 | HR 99 | Temp 97.9°F | Resp 18 | Ht 70.0 in | Wt 194.0 lb

## 2023-03-19 DIAGNOSIS — M25551 Pain in right hip: Secondary | ICD-10-CM | POA: Diagnosis not present

## 2023-03-19 DIAGNOSIS — Z79899 Other long term (current) drug therapy: Secondary | ICD-10-CM

## 2023-03-19 DIAGNOSIS — M25552 Pain in left hip: Secondary | ICD-10-CM

## 2023-03-19 DIAGNOSIS — F419 Anxiety disorder, unspecified: Secondary | ICD-10-CM | POA: Diagnosis not present

## 2023-03-19 MED ORDER — TRAMADOL HCL 50 MG PO TABS: 50.0000 mg | ORAL_TABLET | Freq: Three times a day (TID) | ORAL | 0 refills | Status: DC | PRN

## 2023-03-19 MED ORDER — DIAZEPAM 10 MG PO TABS: 10.0000 mg | ORAL_TABLET | Freq: Three times a day (TID) | ORAL | 0 refills | Status: DC

## 2023-03-19 NOTE — Assessment & Plan Note (Signed)
Suspect pain is from low back Check hip xrays Reviewed mri LS spine --- he has f/u with neurosurgery next month for cervical fusion f/u

## 2023-03-19 NOTE — Progress Notes (Addendum)
Subjective:   By signing my name below, I, [name], attest that this documentation has been prepared under the direction and in the presence of Jason Spates R, DO. 03/19/2023.   Patient ID: Jason Williams, male    DOB: Jan 08, 1961, 62 y.o.   MRN: [id number]  Chief Complaint  Patient presents with   Hip Pain    Pt states having pain on both sides but left is worse.     HPI Patient is in today for an office visit.  He recently underwent anterior cervical decompression/discectomy fusion on 02/28/2023 performed by Dr. Lovell Sheehan. Currently he is wearing a neck brace. For the most part he has been doing okay. A few times he has heard audible "cracks" from his neck, mostly prior to his procedure.  Prior to his recent neck surgery, he was walking frequently and noticed that his left hip would "lock up"; he would be forced to hobble back to his car. Typically his pain has been localized to the region of his left buttocks and left dorsal hip. He also endorses a known history of sciatica, with LE numbness and tingling sensations.   Past Medical History:  Diagnosis Date   Anxiety    Arthritis    Depression    GERD (gastroesophageal reflux disease)    Headache    Heart murmur    as child   History of MRSA infection    toe on right foot   Hyperlipidemia    Hypertension    Sleep apnea    no CPAP    Past Surgical History:  Procedure Laterality Date   ankle re construction  11/27/2009   bilateral   ANTERIOR CERVICAL DECOMP/DISCECTOMY FUSION N/A 02/28/2023   Procedure: Anterior Cervical Decompression/Discectomy Fusion, Interbody Prosthesis, Plate/Screws Cervical Three-Cervical Four, Cervical Four-Cervical Five, Cervical Five- Cervical Six;  Surgeon: Tressie Stalker, MD;  Location: [facility] OR;  Service: Neurosurgery;  Laterality: N/A;  3C   APPENDECTOMY  11/28/1979   exploratory surgery d/t appendix bursting    Family History  Problem Relation Age of Onset   Esophageal cancer Mother     Heart attack Father        Died of MI at 63   Mental illness Father    Healthy Sister    Healthy Sister    Heart disease Maternal Grandfather        MI/CABG   Heart attack Maternal Grandfather    Colon cancer Neg Hx    Rectal cancer Neg Hx    Stomach cancer Neg Hx     Social History   Socioeconomic History   Marital status: Single    Spouse name: Not on file   Number of children: 1   Years of education: Not on file   Highest education level: Associate degree: occupational, Scientist, product/process development, or vocational program  Occupational History   Occupation: Garment/textile technologist: [employer]  Tobacco Use   Smoking status: Never   Smokeless tobacco: Never  Vaping Use   Vaping Use: Never used  Substance and Sexual Activity   Alcohol use: Yes    Alcohol/week: 2.0 standard drinks of alcohol    Types: 2 Glasses of wine per week   Drug use: No   Sexual activity: Yes    Birth control/protection: Pill, None    Comment: 1 male  Other Topics Concern   Not on file  Social History Narrative   Not on file   Social Determinants of Corporate investment banker  Strain: Low Risk  (03/16/2023)   Overall Financial Resource Strain (CARDIA)    Difficulty of Paying Living Expenses: Not hard at all  Food Insecurity: No Food Insecurity (03/16/2023)   Hunger Vital Sign    Worried About Running Out of Food in the Last Year: Never true    Ran Out of Food in the Last Year: Never true  Transportation Needs: No Transportation Needs (03/16/2023)   PRAPARE - Administrator, Civil Service (Medical): No    Lack of Transportation (Non-Medical): No  Physical Activity: Sufficiently Active (03/16/2023)   Exercise Vital Sign    Days of Exercise per Week: 4 days    Minutes of Exercise per Session: 60 min  Stress: Stress Concern Present (03/16/2023)   [employer] of Occupational Health - Occupational Stress Questionnaire    Feeling of Stress : To some extent  Social Connections: Moderately  Isolated (03/16/2023)   Social Connection and Isolation Panel [NHANES]    Frequency of Communication with Friends and Family: More than three times a week    Frequency of Social Gatherings with Friends and Family: Three times a week    Attends Religious Services: More than 4 times per year    Active Member of Clubs or Organizations: No    Attends Engineer, structural: Not on file    Marital Status: Divorced  Intimate Partner Violence: Not on file    Outpatient Medications Prior to Visit  Medication Sig Dispense Refill   amLODipine (NORVASC) 10 MG tablet Take 1 tablet (10 mg total) by mouth daily. 90 tablet 3   benzonatate (TESSALON) 200 MG capsule Take 1 capsule (200 mg total) by mouth 2 (two) times daily as needed for cough. 20 capsule 0   docusate sodium (COLACE) 100 MG capsule Take 1 capsule (100 mg total) by mouth 2 (two) times daily. 10 capsule 0   Evolocumab (REPATHA SURECLICK) 140 MG/ML SOAJ Inject 1 Dose into the skin every 14 (fourteen) days. 6 mL 3   lisinopril (ZESTRIL) 20 MG tablet Take 1 tablet (20 mg total) by mouth daily. 90 tablet 3   ondansetron (ZOFRAN) 4 MG tablet Take 1 tablet (4 mg total) by mouth every 6 (six) hours as needed for nausea or vomiting. 20 tablet 0   tadalafil (CIALIS) 20 MG tablet Take 1 tablet (20 mg total) by mouth as needed. 30 tablet 3   zolpidem (AMBIEN) 10 MG tablet TAKE 1 TABLET BY MOUTH EVERY DAY AT BEDTIME AS NEEDED FOR SLEEP 90 tablet 0   diazepam (VALIUM) 10 MG tablet Take 1 tablet (10 mg total) by mouth 3 (three) times daily. 90 tablet 0   cyclobenzaprine (FLEXERIL) 10 MG tablet Take 1 tablet (10 mg total) by mouth 3 (three) times daily as needed for muscle spasms. 30 tablet 0   oxyCODONE-acetaminophen (PERCOCET) 5-325 MG tablet Take 1-2 tablets by mouth every 4 (four) hours as needed for severe pain (Postoperative pain). 30 tablet 0   No facility-administered medications prior to visit.    No Known Allergies  Review of Systems   Constitutional:  Negative for fever and malaise/fatigue.  HENT:  Negative for congestion.   Eyes:  Negative for blurred vision.  Respiratory:  Negative for shortness of breath.   Cardiovascular:  Negative for chest pain, palpitations and leg swelling.  Gastrointestinal:  Negative for abdominal pain, blood in stool and nausea.  Genitourinary:  Negative for dysuria and frequency.  Musculoskeletal:  Positive for joint pain (Left hip/buttocks). Negative  for falls.       +Occasional audible "cracking" of neck  Skin:  Negative for rash.  Neurological:  Negative for dizziness, loss of consciousness and headaches.  Endo/Heme/Allergies:  Negative for environmental allergies.  Psychiatric/Behavioral:  Negative for depression. The patient is not nervous/anxious.        Objective:    Physical Exam Constitutional:      General: He is not in acute distress.    Appearance: Normal appearance. He is not ill-appearing.  HENT:     Head: Normocephalic and atraumatic.     Right Ear: Tympanic membrane, ear canal and external ear normal.     Left Ear: Tympanic membrane, ear canal and external ear normal.  Eyes:     Extraocular Movements: Extraocular movements intact.     Pupils: Pupils are equal, round, and reactive to light.  Cardiovascular:     Rate and Rhythm: Normal rate and regular rhythm.     Heart sounds: Normal heart sounds. No murmur heard.    No gallop.  Pulmonary:     Effort: Pulmonary effort is normal. No respiratory distress.     Breath sounds: Normal breath sounds. No wheezing or rales.  Musculoskeletal:     Comments: Tenderness of bilateral buttocks.  Skin:    General: Skin is warm and dry.  Neurological:     General: No focal deficit present.     Mental Status: He is alert and oriented to person, place, and time.     Comments: LE numbness and tingling with radiation distally.    Psychiatric:        Mood and Affect: Mood normal.        Behavior: Behavior normal.     BP  120/60 (BP Location: Left Arm, Patient Position: Sitting, Cuff Size: Normal)   Pulse 99   Temp 97.9 F (36.6 C) (Oral)   Resp 18   Ht 5\' 10"  (1.778 m)   Wt 194 lb (88 kg)   SpO2 98%   BMI 27.84 kg/m  Wt Readings from Last 3 Encounters:  03/19/23 194 lb (88 kg)  02/28/23 200 lb (90.7 kg)  02/21/23 208 lb 12.8 oz (94.7 kg)    Diabetic Foot Exam - Simple   No data filed    Lab Results  Component Value Date   WBC 5.6 02/21/2023   HGB 15.7 02/21/2023   HCT 46.0 02/21/2023   PLT 238 02/21/2023   GLUCOSE 103 (H) 02/21/2023   CHOL 113 11/09/2022   TRIG 122.0 11/09/2022   HDL 39.50 11/09/2022   LDLCALC 49 11/09/2022   ALT 29 11/09/2022   AST 17 11/09/2022   NA 137 02/21/2023   K 4.5 02/21/2023   CL 104 02/21/2023   CREATININE 1.20 02/21/2023   BUN 16 02/21/2023   CO2 25 02/21/2023   TSH 3.38 12/13/2021   PSA 3.03 04/28/2022   HGBA1C 5.3 01/25/2015    Lab Results  Component Value Date   TSH 3.38 12/13/2021   Lab Results  Component Value Date   WBC 5.6 02/21/2023   HGB 15.7 02/21/2023   HCT 46.0 02/21/2023   MCV 91.5 02/21/2023   PLT 238 02/21/2023   Lab Results  Component Value Date   NA 137 02/21/2023   K 4.5 02/21/2023   CO2 25 02/21/2023   GLUCOSE 103 (H) 02/21/2023   BUN 16 02/21/2023   CREATININE 1.20 02/21/2023   BILITOT 0.5 11/09/2022   ALKPHOS 49 11/09/2022   AST 17 11/09/2022  ALT 29 11/09/2022   PROT 7.0 11/09/2022   ALBUMIN 4.5 11/09/2022   CALCIUM 9.4 02/21/2023   ANIONGAP 8 02/21/2023   EGFR 85 08/22/2021   GFR 85.34 11/09/2022   Lab Results  Component Value Date   CHOL 113 11/09/2022   Lab Results  Component Value Date   HDL 39.50 11/09/2022   Lab Results  Component Value Date   LDLCALC 49 11/09/2022   Lab Results  Component Value Date   TRIG 122.0 11/09/2022   Lab Results  Component Value Date   CHOLHDL 3 11/09/2022   Lab Results  Component Value Date   HGBA1C 5.3 01/25/2015       Assessment & Plan:   Problem  List Items Addressed This Visit       Unprioritized   Bilateral hip pain - Primary    Suspect pain is from low back Check hip xrays Reviewed mri LS spine --- he has f/u with neurosurgery next month for cervical fusion f/u      Relevant Orders   DG HIPS BILAT W OR W/O PELVIS 2V   Other Visit Diagnoses     Anxiety       Relevant Medications   diazepam (VALIUM) 10 MG tablet   High risk medication use       Relevant Orders   Drug Monitoring Panel [phone] , Urine        Meds ordered this encounter  Medications   diazepam (VALIUM) 10 MG tablet    Sig: Take 1 tablet (10 mg total) by mouth 3 (three) times daily.    Dispense:  90 tablet    Refill:  0    Not to exceed 5 additional fills before 12/11/2022 DX Code Needed  REFILL.   traMADol (ULTRAM) 50 MG tablet    Sig: Take 1 tablet (50 mg total) by mouth 3 (three) times daily as needed for moderate pain or severe pain.    Dispense:  90 tablet    Refill:  0    Not to exceed 4 additional fills before 09/11/2021    I, Donato Schultz, DO, personally preformed the services described in this documentation.  All medical record entries made by the scribe were at my direction and in my presence.  I have reviewed the chart and discharge instructions (if applicable) and agree that the record reflects my personal performance and is accurate and complete. 03/19/2023.  I,Mathew Stumpf,acting as a Neurosurgeon for [employer], DO.,have documented all relevant documentation on the behalf of Donato Schultz, DO,as directed by  Donato Schultz, DO while in the presence of Donato Schultz, DO.   Donato Schultz, DO

## 2023-03-21 LAB — DRUG MONITORING PANEL 376104, URINE
Alphahydroxyalprazolam: NEGATIVE ng/mL (ref <25)
Alphahydroxymidazolam: NEGATIVE ng/mL (ref <50)
Alphahydroxytriazolam: NEGATIVE ng/mL (ref <50)
Aminoclonazepam: NEGATIVE ng/mL (ref <25)
Amphetamines: NEGATIVE ng/mL (ref <500)
Barbiturates: NEGATIVE ng/mL (ref <300)
Benzodiazepines: POSITIVE ng/mL — AB (ref <100)
Cocaine Metabolite: NEGATIVE ng/mL (ref <150)
Desmethyltramadol: 9980 ng/mL — ABNORMAL HIGH (ref <100)
Hydroxyethylflurazepam: NEGATIVE ng/mL (ref <50)
Lorazepam: NEGATIVE ng/mL (ref <50)
Nordiazepam: 1020 ng/mL — ABNORMAL HIGH (ref <50)
Opiates: NEGATIVE ng/mL (ref <100)
Oxazepam: 3097 ng/mL — ABNORMAL HIGH (ref <50)
Oxycodone: NEGATIVE ng/mL (ref <100)
Temazepam: 1663 ng/mL — ABNORMAL HIGH (ref <50)
Tramadol: >10000 ng/mL — ABNORMAL HIGH (ref <100)

## 2023-03-21 LAB — DM TEMPLATE

## 2023-03-26 DIAGNOSIS — W57XXXA Bitten or stung by nonvenomous insect and other nonvenomous arthropods, initial encounter: Secondary | ICD-10-CM | POA: Diagnosis not present

## 2023-03-26 DIAGNOSIS — R21 Rash and other nonspecific skin eruption: Secondary | ICD-10-CM | POA: Diagnosis not present

## 2023-03-29 ENCOUNTER — Ambulatory Visit (INDEPENDENT_AMBULATORY_CARE_PROVIDER_SITE_OTHER): Payer: BC Managed Care – PPO

## 2023-03-29 DIAGNOSIS — M25551 Pain in right hip: Secondary | ICD-10-CM | POA: Diagnosis not present

## 2023-03-29 DIAGNOSIS — M25552 Pain in left hip: Secondary | ICD-10-CM | POA: Diagnosis not present

## 2023-04-02 ENCOUNTER — Telehealth: Payer: Self-pay | Admitting: Family Medicine

## 2023-04-02 NOTE — Telephone Encounter (Signed)
Pt called for his imaging results. Advised that the results are not in yet from the radiologist. Please call to advise once results are in

## 2023-04-03 NOTE — Telephone Encounter (Signed)
Pt viewed results via Mychart °

## 2023-04-11 ENCOUNTER — Telehealth: Payer: Self-pay | Admitting: Family Medicine

## 2023-04-11 NOTE — Telephone Encounter (Signed)
Spoke with Dr. Lovell Sheehan assistant and she stated that she just received a message from Dr. Lovell Sheehan to call him.  The patient apparently left a message yesterday. But they did get message and they said they will call him back as soon as she gets a chance.  Patient notified of this.

## 2023-04-11 NOTE — Telephone Encounter (Signed)
Pt called stating that he has been having issues with getting in contact with Dr. Lin Givens office regarding his post-op PT referral. Pt stated that he has tried to reach out several times to get this sorted and is unable to get in touch with the office. Pt asked if Dr. Laury Axon could look into getting this referral sorted for him. After discussing with Apolonio Schneiders, advised pt that the referral is really supposed to be coming from the surgeon but that we'd reach out to see what is going on with it and let him know s soon as we know more.

## 2023-04-15 DIAGNOSIS — I10 Essential (primary) hypertension: Secondary | ICD-10-CM | POA: Diagnosis not present

## 2023-04-15 DIAGNOSIS — T63441A Toxic effect of venom of bees, accidental (unintentional), initial encounter: Secondary | ICD-10-CM | POA: Diagnosis not present

## 2023-04-15 DIAGNOSIS — R059 Cough, unspecified: Secondary | ICD-10-CM | POA: Diagnosis not present

## 2023-04-15 DIAGNOSIS — L299 Pruritus, unspecified: Secondary | ICD-10-CM | POA: Diagnosis not present

## 2023-04-15 DIAGNOSIS — R0789 Other chest pain: Secondary | ICD-10-CM | POA: Diagnosis not present

## 2023-04-16 DIAGNOSIS — R079 Chest pain, unspecified: Secondary | ICD-10-CM | POA: Diagnosis not present

## 2023-04-17 ENCOUNTER — Other Ambulatory Visit: Payer: Self-pay

## 2023-04-17 ENCOUNTER — Ambulatory Visit: Payer: BC Managed Care – PPO | Attending: Neurosurgery

## 2023-04-17 DIAGNOSIS — M6281 Muscle weakness (generalized): Secondary | ICD-10-CM

## 2023-04-17 DIAGNOSIS — M542 Cervicalgia: Secondary | ICD-10-CM

## 2023-04-17 NOTE — Therapy (Signed)
OUTPATIENT PHYSICAL THERAPY CERVICAL EVALUATION   Patient Name: Jason Williams MRN: [id number] DOB:1961/06/18, 62 y.o., male Today's Date: 04/17/2023  END OF SESSION:  PT End of Session - 04/17/23 1200     Date for PT Re-Evaluation 06/12/23    Progress Note Due on Visit 10    Activity Tolerance Patient tolerated treatment well    Behavior During Therapy [facility] for tasks assessed/performed             Past Medical History:  Diagnosis Date   Anxiety    Arthritis    Depression    GERD (gastroesophageal reflux disease)    Headache    Heart murmur    as child   History of MRSA infection    toe on right foot   Hyperlipidemia    Hypertension    Sleep apnea    no CPAP   Past Surgical History:  Procedure Laterality Date   ankle re construction  11/27/2009   bilateral   ANTERIOR CERVICAL DECOMP/DISCECTOMY FUSION N/A 02/28/2023   Procedure: Anterior Cervical Decompression/Discectomy Fusion, Interbody Prosthesis, Plate/Screws Cervical Three-Cervical Four, Cervical Four-Cervical Five, Cervical Five- Cervical Six;  Surgeon: Tressie Stalker, MD;  Location: [facility] OR;  Service: Neurosurgery;  Laterality: N/A;  3C   APPENDECTOMY  11/28/1979   exploratory surgery d/t appendix bursting   Patient Active Problem List   Diagnosis Date Noted   Bilateral hip pain 03/19/2023   Cervical spondylosis with myelopathy and radiculopathy 02/28/2023   Suspicious nevus 11/09/2022   Chronic pain syndrome 11/09/2022   Excessive urination at night 04/28/2022   Statin intolerance 07/12/2021   Primary osteoarthritis of right knee 07/12/2021   Acute pain of right knee 12/22/2020   Preventative health care 09/15/2019   Screening for HIV without presence of risk factors 09/15/2019   Encounter for hepatitis C screening test for low risk patient 09/15/2019   BPH (benign prostatic hyperplasia) 03/31/2016   GERD (gastroesophageal reflux disease) 03/18/2015   ED 10/07/2014   Carpal tunnel syndrome 12/28/2011    Primary hypertension 01/24/2011   SNORING 01/02/2011   Hyperlipidemia 11/01/2010   Anxiety state 11/01/2010   PLANTAR FASCIITIS, BILATERAL 08/15/2010   PAIN IN JOINT, ANKLE AND FOOT 03/10/2010   Unspecified episodic mood disorder 01/06/2010   Insomnia 01/24/2007    PCP: Seabron Spates  REFERRING PROVIDER: Tressie Stalker  REFERRING DIAG: s/p ant cervical fusion C 3 to C6  THERAPY DIAG:  Cervicalgia  Muscle weakness (generalized)  Rationale for Evaluation and Treatment: Rehabilitation  ONSET DATE: 02/28/23  SUBJECTIVE:  SUBJECTIVE STATEMENT: I have developed a new pain L shoulder and upper back since this surgery, feel like my biceps is going to tear if I just lift a cup of coffee L hand.  I am having to sleep in recliner, can't get in the bed.  Pain is a dull throb which is constant post neck Hand dominance: Right  PERTINENT HISTORY:  Progressive R hand paresthesia and radiculopathy B Ue's for years.  Underwent ant cervical fusion 02/28/23.  Referred to this PT clinic to address his recovery.    PAIN:  Are you having pain? Yes: NPRS scale: 4/10 Pain location: post cervical spine, B upper traps Pain description: constant Aggravating factors: worse with lying down,  Relieving factors: pain meds relieve somewhat  PRECAUTIONS: Cervical  WEIGHT BEARING RESTRICTIONS: No  FALLS:  Has patient fallen in last 6 months? 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, my job, and Psychiatrist  NEXT MD VISIT: one week for 6 week surgical follow up  OBJECTIVE:   DIAGNOSTIC FINDINGS:  Hardware intact  PATIENT SURVEYS:  NDI 20/50  or 40% impairment  COGNITION: Overall cognitive status: Within functional limits for tasks assessed  SENSATION: Light touch: Impaired  R thumb impaired LT  POSTURE: rounded shoulders and decreased lumbar lordosis  PALPATION: Na today   CERVICAL ROM: cervical spine ROM deferred at eval as pt in hard collar   UPPER EXTREMITY ROM:  Active ROM Right eval Left eval  Shoulder flexion 110 110  Shoulder extension    Shoulder abduction 95 95  Shoulder adduction    Shoulder extension    Shoulder internal rotation    Shoulder external rotation    Elbow flexion    Elbow extension    Wrist flexion    Wrist extension    Wrist ulnar deviation    Wrist radial deviation    Wrist pronation    Wrist supination     (Blank rows = not tested)  UPPER EXTREMITY MMT:  MMT Right eval Left eval  Shoulder flexion 4 4  Shoulder extension    Shoulder abduction 4 4  Shoulder adduction    Shoulder extension    Shoulder internal rotation    Shoulder external rotation    Middle trapezius    Lower trapezius    Elbow flexion 5 4  Elbow extension 4 4  Wrist flexion 5 5  Wrist extension 5 5  Wrist ulnar deviation    Wrist radial deviation    Wrist pronation    Wrist supination    Grip strength 60 62   (Blank rows = not tested)  CERVICAL SPECIAL TESTS:  Na due to recent cervical surgery  FUNCTIONAL TESTS:  5 times sit to stand: 24.62  Gait: able to walk in clinic I without device.  Noted B shortened stride length, also primarily foot flat contact B  TODAY'S TREATMENT:  DATE: 04/17/23 : eval and initiated home program, instructed in hip flexor stretch with knee on chair, as well as 5 x sit to stand from chair with  pillow in seat to perform 5 x every meal, for 3 x day, as well as ex  below:  PATIENT EDUCATION:  Education details: POC, goals Person educated: Patient Education method: Explanation, Demonstration, Tactile cues, Verbal cues, and Handouts Education comprehension: verbalized understanding, returned demonstration, verbal cues required, tactile cues required, and needs further education  HOME EXERCISE PROGRAM: Access Code: [email or web address] URL: [email or web address] Date: 04/17/2023 Prepared by: Caralee Ates  Exercises - Standing Shoulder External Rotation with Resistance  - 3 x daily - 7 x weekly - 2 sets - 10 reps - Seated Shoulder Row with Resistance Anchored at Feet  - 3 x daily - 7 x weekly - 2 sets - 10 reps - Standing Serratus Punch with Resistance  - 3 x daily - 7 x weekly - 2 sets - 10 reps - Standing Isometric Cervical Sidebending with Manual Resistance  - 1 x daily - 7 x weekly - 1 sets - 10 reps - Standing Isometric Cervical Flexion with Manual Resistance  - 1 x daily - 7 x weekly - 1 sets - 10 reps - Standing Isometric Cervical Extension with Manual Resistance  - 1 x daily - 7 x weekly - 1 sets - 10 reps - Seated Isometric Cervical Rotation  - 1 x daily - 7 x weekly - 1 sets - 10 reps  ASSESSMENT:  CLINICAL IMPRESSION: Patient is a 62 y.o. male who was seen today for physical therapy evaluation and treatment for s/p anterior cervical fusion, C 3 to C 6.  He is currently at 6 weeks post op.  Reports loss of muscle mass B UE's, noted weakness overall UE's, including B grip strength.  Has more pronounced weakness noted L biceps and triceps.  He also is experiencing B hip weakness and pain with prolonged walking. He is experiencing pain post cervical spine, B upper traps, B upper thoracic paraspinal region since his surgery. He is having difficulty sleeping, reports sleeping in recliner, unable to tolerate lying in bed due to pain neck.  He is used to a very active lifestyle,  repairs medical equipment, and lifts weights, performs cardio ex regularly and plays golf frequently.  He will benefit from skilled PT to address his recovery from this surgery in order to return to his previous activities.  Today focused on addressing his motor recovery and gentle therex, advised him also in future visits that we can utilize dry needling, e stim, modalities, etc to assist him with pain management as well.    OBJECTIVE IMPAIRMENTS: decreased activity tolerance, decreased endurance, decreased mobility, difficulty walking, decreased ROM, decreased strength, hypomobility, increased muscle spasms, impaired flexibility, impaired sensation, impaired UE functional use, postural dysfunction, and pain.   ACTIVITY LIMITATIONS: carrying, lifting, bending, standing, squatting, sleeping, stairs, transfers, bed mobility, reach over head, and locomotion level  PARTICIPATION LIMITATIONS: meal prep, laundry, shopping, community activity, occupation, and yard work  PERSONAL FACTORS: Fitness, Past/current experiences, Profession, Time since onset of injury/illness/exacerbation, and 1 comorbidity: lumbar radiculopathy  are also affecting patient's functional outcome.   REHAB POTENTIAL: Good  CLINICAL DECISION MAKING: Stable/uncomplicated  EVALUATION COMPLEXITY: Low   GOALS: Goals reviewed with patient? Yes  SHORT TERM GOALS: Target date: 2 weeks, 05/01/23  I HEP  Baseline: initiated at eval Goal status: INITIAL    LONG TERM GOALS: Target date: 06/12/23  Improve NDI  from 20/50 to 10/50 Baseline: 20/50 Goal status: INITIAL  2.  Improve 5 x sit to stand to less than 14 sec Baseline: 24.62 Goal status: INITIAL  3.  Improve B UE strength overall to 5/5 with MMT Baseline: L biceps, triceps 4-/5, all other MMT Ue's 4/5 Goal status: INITIAL  4. Assess balance within first 2 weeks of rehab with BERG balance test, especially forward reach, single leg and tandem standing and establish  goal Baseline: NA yet Goal status: INITIAL  PLAN:  PT FREQUENCY: 2x/week  PT DURATION: 8 weeks  PLANNED INTERVENTIONS: Therapeutic exercises, Therapeutic activity, Neuromuscular re-education, Balance training, Gait training, Patient/Family education, Self Care, and Joint mobilization  PLAN FOR NEXT SESSION: how were home ex?  Add e stim or dry needling, review home ex and adapt as needed.   Orenthal Debski L Diondra Pines, PT 04/17/2023, 12:16 PM

## 2023-04-18 ENCOUNTER — Other Ambulatory Visit: Payer: Self-pay | Admitting: Family Medicine

## 2023-04-18 DIAGNOSIS — F419 Anxiety disorder, unspecified: Secondary | ICD-10-CM

## 2023-04-18 NOTE — Telephone Encounter (Signed)
Requesting: Valium Contract: 03/19/23 UDS: 03/19/2023 Last OV: 03/19/23 Next OV: N/A Last Refill: 04/22/204 Database:   Requesting: Tramadol Contract: UDS: Last OV: Next OV: Last Refill: 03/19/2023, #[id number] RF Database:   Please advise

## 2023-04-18 NOTE — Telephone Encounter (Signed)
Prescription Request  04/18/2023  Is this a "Controlled Substance" medicine? Yes  LOV: 03/19/2023  What is the name of the medication or equipment?   diazepam (VALIUM) 10 MG tablet [[id number]]   traMADol (ULTRAM) 50 MG tablet [[id number]]   Have you contacted your pharmacy to request a refill? No   Which pharmacy would you like this sent to?   [id number] - [place], [place] - [address] AT [address] [place] [address] Phone: [phone] Fax: [phone]  Patient notified that their request is being sent to the clinical staff for review and that they should receive a response within 2 business days.   Please advise at Mobile [phone] (mobile)

## 2023-04-18 NOTE — Addendum Note (Signed)
Addended by: Roxanne Gates on: 04/18/2023 08:40 AM   Modules accepted: Orders

## 2023-04-19 DIAGNOSIS — M542 Cervicalgia: Secondary | ICD-10-CM | POA: Diagnosis not present

## 2023-04-19 MED ORDER — TRAMADOL HCL 50 MG PO TABS: 50.0000 mg | ORAL_TABLET | Freq: Three times a day (TID) | ORAL | 0 refills | Status: DC | PRN

## 2023-04-19 MED ORDER — DIAZEPAM 10 MG PO TABS: 10.0000 mg | ORAL_TABLET | Freq: Three times a day (TID) | ORAL | 0 refills | Status: DC

## 2023-04-20 ENCOUNTER — Encounter: Payer: Self-pay | Admitting: Physical Therapy

## 2023-04-20 ENCOUNTER — Ambulatory Visit: Payer: BC Managed Care – PPO | Admitting: Physical Therapy

## 2023-04-20 DIAGNOSIS — M6281 Muscle weakness (generalized): Secondary | ICD-10-CM

## 2023-04-20 DIAGNOSIS — M542 Cervicalgia: Secondary | ICD-10-CM | POA: Diagnosis not present

## 2023-04-20 NOTE — Therapy (Signed)
OUTPATIENT PHYSICAL THERAPY CERVICAL EVALUATION   Patient Name: Jason Williams MRN: [id number] DOB:[birth year], 62 y.o., male Today's Date: 04/20/2023  END OF SESSION:  PT End of Session - 04/20/23 1011     Visit Number 2    Date for PT Re-Evaluation 06/12/23    PT Start Time 1011    PT Stop Time 1056    PT Time Calculation (min) 45 min    Activity Tolerance Patient tolerated treatment well    Behavior During Therapy WFL for tasks assessed/performed             Past Medical History:  Diagnosis Date   Anxiety    Arthritis    Depression    GERD (gastroesophageal reflux disease)    Headache    Heart murmur    as child   History of MRSA infection    toe on right foot   Hyperlipidemia    Hypertension    Sleep apnea    no CPAP   Past Surgical History:  Procedure Laterality Date   ankle re construction  11/27/2009   bilateral   ANTERIOR CERVICAL DECOMP/DISCECTOMY FUSION N/A 02/28/2023   Procedure: Anterior Cervical Decompression/Discectomy Fusion, Interbody Prosthesis, Plate/Screws Cervical Three-Cervical Four, Cervical Four-Cervical Five, Cervical Five- Cervical Six;  Surgeon: Tressie Stalker, MD;  Location: [facility] OR;  Service: Neurosurgery;  Laterality: N/A;  3C   APPENDECTOMY  11/28/1979   exploratory surgery d/t appendix bursting   Patient Active Problem List   Diagnosis Date Noted   Bilateral hip pain 03/19/2023   Cervical spondylosis with myelopathy and radiculopathy 02/28/2023   Suspicious nevus 11/09/2022   Chronic pain syndrome 11/09/2022   Excessive urination at night 04/28/2022   Statin intolerance 07/12/2021   Primary osteoarthritis of right knee 07/12/2021   Acute pain of right knee 12/22/2020   Preventative health care 09/15/2019   Screening for HIV without presence of risk factors 09/15/2019   Encounter for hepatitis C screening test for low risk patient 09/15/2019   BPH (benign prostatic hyperplasia) 03/31/2016   GERD (gastroesophageal reflux  disease) 03/18/2015   ED 10/07/2014   Carpal tunnel syndrome 12/28/2011   Primary hypertension 01/24/2011   SNORING 01/02/2011   Hyperlipidemia 11/01/2010   Anxiety state 11/01/2010   PLANTAR FASCIITIS, BILATERAL 08/15/2010   PAIN IN JOINT, ANKLE AND FOOT 03/10/2010   Unspecified episodic mood disorder 01/06/2010   Insomnia 01/24/2007    PCP: Seabron Spates  REFERRING PROVIDER: Tressie Stalker  REFERRING DIAG: s/p ant cervical fusion C 3 to C6  THERAPY DIAG:  Cervicalgia  Muscle weakness (generalized)  Rationale for Evaluation and Treatment: Rehabilitation  ONSET DATE: 02/28/23  SUBJECTIVE:  SUBJECTIVE STATEMENT: Patient reports no pain currently reports weakness and stiffness, c/o mostly left biceps.  Saw Dr. Lovell Sheehan, feels like everything is going well and is out of work for another 6 weeks Hand dominance: Right  PERTINENT HISTORY:  Progressive R hand paresthesia and radiculopathy B Ue's for years.  Underwent ant cervical fusion 02/28/23.  Referred to this PT clinic to address his recovery.    PAIN:  Are you having pain? Yes: NPRS scale: 4/10 Pain location: post cervical spine, B upper traps Pain description: constant Aggravating factors: worse with lying down,  Relieving factors: pain meds relieve somewhat  PRECAUTIONS: Cervical  WEIGHT BEARING RESTRICTIONS: No  FALLS:  Has patient fallen in last 6 months? 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, my job, and Psychiatrist  NEXT MD VISIT: one week for 6 week surgical follow up  OBJECTIVE:   DIAGNOSTIC FINDINGS:  Hardware intact  PATIENT SURVEYS:  NDI 20/50  or 40% impairment  COGNITION: Overall cognitive status: Within functional limits for tasks assessed  SENSATION: Light touch: Impaired  R thumb impaired LT  POSTURE: rounded shoulders and decreased lumbar lordosis  PALPATION: Na today   CERVICAL ROM: cervical spine ROM deferred at eval as pt in hard collar   UPPER EXTREMITY ROM:  Active ROM Right eval Left eval  Shoulder flexion 110 110  Shoulder extension    Shoulder abduction 95 95  Shoulder adduction    Shoulder extension    Shoulder internal rotation    Shoulder external rotation    Elbow flexion    Elbow extension    Wrist flexion    Wrist extension    Wrist ulnar deviation    Wrist radial deviation    Wrist pronation    Wrist supination     (Blank rows = not tested)  UPPER EXTREMITY MMT:  MMT Right eval Left eval  Shoulder flexion 4 4  Shoulder extension    Shoulder abduction 4 4  Shoulder adduction    Shoulder extension    Shoulder internal rotation    Shoulder external rotation    Middle trapezius    Lower trapezius    Elbow flexion 5 4  Elbow extension 4 4  Wrist flexion 5 5  Wrist extension 5 5  Wrist ulnar deviation    Wrist radial deviation    Wrist pronation    Wrist supination    Grip strength 60 62   (Blank rows = not tested)  CERVICAL SPECIAL TESTS:  Na due to recent cervical surgery  FUNCTIONAL TESTS:  5 times sit to stand: 24.62  Gait: able to walk in clinic I without device.  Noted B shortened stride length, also primarily foot flat contact B  TODAY'S TREATMENT:  DATE:  04/20/23 UBE level 2 x 5 minutes Back to wall red tband ER Red tband horizontal abduction Red tband rows and extension 2x10 5# biceps  2x10 Green tband triceps Wall slides flexion for shoulder ROM Supine abdominal isometrics pushing into ball Supine UE stretches, neural tension Corner stretch for pecs and biceps  04/17/23 : eval and initiated home program, instructed in hip flexor stretch with knee on chair, as well as 5 x sit to stand from chair with pillow in seat to perform 5 x every meal, for 3 x day, as well as ex  below:  PATIENT EDUCATION:  Education details: POC, goals Person educated: Patient Education method: Explanation, Demonstration, Tactile cues, Verbal cues, and Handouts Education comprehension: verbalized understanding, returned demonstration, verbal cues required, tactile cues required, and needs further education  HOME EXERCISE PROGRAM: Access Code: [email or web address] URL: [email or web address] Date: 04/17/2023 Prepared by: Caralee Ates  Exercises - Standing Shoulder External Rotation with Resistance  - 3 x daily - 7 x weekly - 2 sets - 10 reps - Seated Shoulder Row with Resistance Anchored at Feet  - 3 x daily - 7 x weekly - 2 sets - 10 reps - Standing Serratus Punch with Resistance  - 3 x daily - 7 x weekly - 2 sets - 10 reps - Standing Isometric Cervical Sidebending with Manual Resistance  - 1 x daily - 7 x weekly - 1 sets - 10 reps - Standing Isometric Cervical Flexion with Manual Resistance  - 1 x daily - 7 x weekly - 1 sets - 10 reps - Standing Isometric Cervical Extension with Manual Resistance  - 1 x daily - 7 x weekly - 1 sets - 10 reps - Seated Isometric Cervical Rotation  - 1 x daily - 7 x weekly - 1 sets - 10 reps  ASSESSMENT:  CLINICAL IMPRESSION: I initiated exercises and stretches, patient did well, no increase of pain, needed cues for form and posture and to go slow.  He c/o a little bit of tension in the neck when we did the triceps press.  He was very tight median nerve, forearm, pectorals and biceps. Patient is a 62 y.o. male who was seen today for physical therapy evaluation  and treatment for s/p anterior cervical fusion, C 3 to C 6.  He is currently at 6 weeks post op.  Reports loss of muscle mass B UE's, noted weakness overall UE's, including B grip strength.   OBJECTIVE IMPAIRMENTS: decreased activity tolerance, decreased endurance, decreased mobility, difficulty walking, decreased ROM, decreased strength, hypomobility, increased muscle spasms, impaired flexibility, impaired sensation, impaired UE functional use, postural dysfunction, and pain.   ACTIVITY LIMITATIONS: carrying, lifting, bending, standing, squatting, sleeping, stairs, transfers, bed mobility, reach over head, and locomotion level  PARTICIPATION LIMITATIONS: meal prep, laundry, shopping, community activity, occupation, and yard work  PERSONAL FACTORS: Fitness, Past/current experiences, Profession, Time since onset of injury/illness/exacerbation, and 1 comorbidity: lumbar radiculopathy  are also affecting patient's functional outcome.   REHAB POTENTIAL: Good  CLINICAL DECISION MAKING: Stable/uncomplicated  EVALUATION COMPLEXITY: Low   GOALS: Goals reviewed with patient? Yes  SHORT TERM GOALS: Target date: 2 weeks, 05/01/23  I HEP  Baseline: initiated at eval Goal status: INITIAL    LONG TERM GOALS: Target date: 06/12/23  Improve NDI from 20/50 to 10/50 Baseline: 20/50 Goal status: INITIAL  2.  Improve 5 x sit to stand to less than 14 sec Baseline: 24.62 Goal status: INITIAL  3.  Improve B UE strength overall to  5/5 with MMT Baseline: L biceps, triceps 4-/5, all other MMT Ue's 4/5 Goal status: INITIAL  4. Assess balance within first 2 weeks of rehab with BERG balance test, especially forward reach, single leg and tandem standing and establish goal Baseline: NA yet Goal status: INITIAL  PLAN:  PT FREQUENCY: 2x/week  PT DURATION: 8 weeks  PLANNED INTERVENTIONS: Therapeutic exercises, Therapeutic activity, Neuromuscular re-education, Balance training, Gait training,  Patient/Family education, Self Care, and Joint mobilization  PLAN FOR NEXT SESSION: see how the initiation of the exercises and stretches did and progress as tolerated.  Add e stim or dry needling, review home ex and adapt as needed.   Jearld Lesch, PT 04/20/2023, 10:12 AM

## 2023-04-24 ENCOUNTER — Ambulatory Visit: Payer: BC Managed Care – PPO

## 2023-04-26 ENCOUNTER — Ambulatory Visit: Payer: BC Managed Care – PPO

## 2023-05-03 ENCOUNTER — Ambulatory Visit: Payer: BC Managed Care – PPO | Attending: Neurosurgery

## 2023-05-03 ENCOUNTER — Other Ambulatory Visit: Payer: Self-pay

## 2023-05-03 DIAGNOSIS — M6281 Muscle weakness (generalized): Secondary | ICD-10-CM | POA: Diagnosis not present

## 2023-05-03 DIAGNOSIS — M542 Cervicalgia: Secondary | ICD-10-CM | POA: Diagnosis not present

## 2023-05-03 NOTE — Therapy (Signed)
OUTPATIENT PHYSICAL THERAPY CERVICAL TREATMENT    Patient Name: Jason Williams MRN: [id number] DOB:[birth year], 62 y.o., male Today's Date: 05/03/2023  END OF SESSION:  PT End of Session - 05/03/23 1103     Visit Number 3    Date for PT Re-Evaluation 06/12/23    Progress Note Due on Visit 10    PT Start Time 1103    PT Stop Time 1145    PT Time Calculation (min) 42 min    Activity Tolerance Patient tolerated treatment well    Behavior During Therapy WFL for tasks assessed/performed             Past Medical History:  Diagnosis Date   Anxiety    Arthritis    Depression    GERD (gastroesophageal reflux disease)    Headache    Heart murmur    as child   History of MRSA infection    toe on right foot   Hyperlipidemia    Hypertension    Sleep apnea    no CPAP   Past Surgical History:  Procedure Laterality Date   ankle re construction  11/27/2009   bilateral   ANTERIOR CERVICAL DECOMP/DISCECTOMY FUSION N/A 02/28/2023   Procedure: Anterior Cervical Decompression/Discectomy Fusion, Interbody Prosthesis, Plate/Screws Cervical Three-Cervical Four, Cervical Four-Cervical Five, Cervical Five- Cervical Six;  Surgeon: Tressie Stalker, MD;  Location: [facility] OR;  Service: Neurosurgery;  Laterality: N/A;  3C   APPENDECTOMY  11/28/1979   exploratory surgery d/t appendix bursting   Patient Active Problem List   Diagnosis Date Noted   Bilateral hip pain 03/19/2023   Cervical spondylosis with myelopathy and radiculopathy 02/28/2023   Suspicious nevus 11/09/2022   Chronic pain syndrome 11/09/2022   Excessive urination at night 04/28/2022   Statin intolerance 07/12/2021   Primary osteoarthritis of right knee 07/12/2021   Acute pain of right knee 12/22/2020   Preventative health care 09/15/2019   Screening for HIV without presence of risk factors 09/15/2019   Encounter for hepatitis C screening test for low risk patient 09/15/2019   BPH (benign prostatic hyperplasia) 03/31/2016    GERD (gastroesophageal reflux disease) 03/18/2015   ED 10/07/2014   Carpal tunnel syndrome 12/28/2011   Primary hypertension 01/24/2011   SNORING 01/02/2011   Hyperlipidemia 11/01/2010   Anxiety state 11/01/2010   PLANTAR FASCIITIS, BILATERAL 08/15/2010   PAIN IN JOINT, ANKLE AND FOOT 03/10/2010   Unspecified episodic mood disorder 01/06/2010   Insomnia 01/24/2007    PCP: Seabron Spates  REFERRING PROVIDER: Tressie Stalker  REFERRING DIAG: s/p ant cervical fusion C 3 to C6  THERAPY DIAG:  Cervicalgia  Muscle weakness (generalized)  Rationale for Evaluation and Treatment: Rehabilitation  ONSET DATE: 02/28/23  SUBJECTIVE:  SUBJECTIVE STATEMENT: When I saw Dr. Lovell Sheehan he told me I could remove my collar.  How I feel kind of feels like it depends on ow I sleep.  My L arm still feels weaker than my R, and I have a little L shoulder pain and biceps pain. Hand dominance: Right  PERTINENT HISTORY:  Progressive R hand paresthesia and radiculopathy B Ue's for years.  Underwent ant cervical fusion 02/28/23.  Referred to this PT clinic to address his recovery.    PAIN:  Are you having pain? Yes: NPRS scale: 4/10 Pain location: post cervical spine, B upper traps Pain description: constant Aggravating factors: worse with lying down,  Relieving factors: pain meds relieve somewhat  PRECAUTIONS: Cervical  WEIGHT BEARING RESTRICTIONS: No  FALLS:  Has patient fallen in last 6 months? No  LIVING ENVIRONMENT: Lives with: lives with their family and lives alone Lives in: House/apartment Stairs: Yes: Internal: 11 steps; on right going up and External: 4 steps; on left going up Has following equipment at home: None  OCCUPATION: services medical equipment  PLOF: Independent  PATIENT  GOALS: To get back to working out, my job, and [employer] architect  NEXT MD VISIT: one week for 6 week surgical follow up  OBJECTIVE:   DIAGNOSTIC FINDINGS:  Hardware intact  PATIENT SURVEYS:  NDI 20/50  or 40% impairment  COGNITION: Overall cognitive status: Within functional limits for tasks assessed  SENSATION: Light touch: Impaired  R thumb impaired LT  POSTURE: rounded shoulders and decreased lumbar lordosis  PALPATION: Na today   CERVICAL ROM: cervical spine ROM deferred at eval as pt in hard collar   UPPER EXTREMITY ROM:  Active ROM Right eval Left eval  Shoulder flexion 110 110  Shoulder extension    Shoulder abduction 95 95  Shoulder adduction    Shoulder extension    Shoulder internal rotation    Shoulder external rotation    Elbow flexion    Elbow extension    Wrist flexion    Wrist extension    Wrist ulnar deviation    Wrist radial deviation    Wrist pronation    Wrist supination     (Blank rows = not tested)  UPPER EXTREMITY MMT:  MMT Right eval Left eval  Shoulder flexion 4 4  Shoulder extension    Shoulder abduction 4 4  Shoulder adduction    Shoulder extension    Shoulder internal rotation    Shoulder external rotation    Middle trapezius    Lower trapezius    Elbow flexion 5 4  Elbow extension 4 4  Wrist flexion 5 5  Wrist extension 5 5  Wrist ulnar deviation    Wrist radial deviation    Wrist pronation    Wrist supination    Grip strength 60 62   (Blank rows = not tested)  CERVICAL SPECIAL TESTS:  Na due to recent cervical surgery  FUNCTIONAL TESTS:  5 times sit to stand: 24.62  Gait: able to walk in clinic I without device.  Noted B shortened stride length, also primarily foot flat contact B  TODAY'S TREATMENT:  DATE:  05/03/23 Nustep level 6 B UE's and B LE's 7 min Strengthening on the  BATCA: Seated rows, 20#, lower grip, 3 x 15 Chest press, vertical grip, 20# 3 x 15 Lat rows, narrow grip, 35#, facing machine,3 x 15 Seated rows, 20#, upper horizontal grip, 3 x 15 Seated B hamstring curls 35#, 3 x 15 Seated B knee ext,  04/20/23 UBE level 2 x 5 minutes Back to wall red tband ER Red tband horizontal abduction Red tband rows and extension 2x10 5# biceps 2x10 Green tband triceps Wall slides flexion for shoulder ROM Supine abdominal isometrics pushing into ball Supine UE stretches, neural tension Corner stretch for pecs and biceps  04/17/23 : eval and initiated home program, instructed in hip flexor stretch with knee on chair, as well as 5 x sit to stand from chair with pillow in seat to perform 5 x every meal, for 3 x day, as well as ex  below:  PATIENT EDUCATION:  Education details: POC, goals Person educated: Patient Education method: Explanation, Demonstration, Tactile cues, Verbal cues, and Handouts Education comprehension: verbalized understanding, returned demonstration, verbal cues required, tactile cues required, and needs further education  HOME EXERCISE PROGRAM: Access Code: [email or web address] URL: [email or web address] Date: 04/17/2023 Prepared by: Caralee Ates  Exercises - Standing Shoulder External Rotation with Resistance  - 3 x daily - 7 x weekly - 2 sets - 10 reps - Seated Shoulder Row with Resistance Anchored at Feet  - 3 x daily - 7 x weekly - 2 sets - 10 reps - Standing Serratus Punch with Resistance  - 3 x daily - 7 x weekly - 2 sets - 10 reps - Standing Isometric Cervical Sidebending with Manual Resistance  - 1 x daily - 7 x weekly - 1 sets - 10 reps - Standing Isometric Cervical Flexion with Manual Resistance  - 1 x daily - 7 x weekly - 1 sets - 10 reps - Standing Isometric Cervical Extension with Manual Resistance  - 1 x daily - 7 x weekly - 1 sets - 10 reps - Seated Isometric Cervical Rotation  - 1 x daily - 7 x weekly - 1 sets - 10  reps  ASSESSMENT:  CLINICAL IMPRESSION: Patient is a 62 y.o. male who was seen today for physical therapy treatment for s/p anterior cervical fusion, C 3 to C 6.  He is currently at 8 weeks post op. Progressed his therex intensity today to isotonics, with low weights, high reps, taking care to utilize straight plane movements.  Education throughout session regarding healing process, ongoing guarding regarding his recent surgery, emphasis with patient on slow , careful progression, particularly for the first 12 weeks.   OBJECTIVE IMPAIRMENTS: decreased activity tolerance, decreased endurance, decreased mobility, difficulty walking, decreased ROM, decreased strength, hypomobility, increased muscle spasms, impaired flexibility, impaired sensation, impaired UE functional use, postural dysfunction, and pain.   ACTIVITY LIMITATIONS: carrying, lifting, bending, standing, squatting, sleeping, stairs, transfers, bed mobility, reach over head, and locomotion level  PARTICIPATION LIMITATIONS: meal prep, laundry, shopping, community activity, occupation, and yard work  PERSONAL FACTORS: Fitness, Past/current experiences, Profession, Time since onset of injury/illness/exacerbation, and 1 comorbidity: lumbar radiculopathy  are also affecting patient's functional outcome.   REHAB POTENTIAL: Good  CLINICAL DECISION MAKING: Stable/uncomplicated  EVALUATION COMPLEXITY: Low   GOALS: Goals reviewed with patient? Yes  SHORT TERM GOALS: Target date: 2 weeks, 05/01/23  I HEP  Baseline: initiated at eval Goal status: INITIAL    LONG TERM GOALS: Target date: 06/12/23  Improve NDI from 20/50 to 10/50 Baseline: 20/50 Goal status: INITIAL  2.  Improve 5 x sit to stand to less than 14 sec Baseline: 24.62 Goal status: INITIAL  3.  Improve B UE strength overall to 5/5 with MMT Baseline: L biceps, triceps 4-/5, all other MMT Ue's 4/5 Goal status: INITIAL  4. Assess balance within first 2 weeks of rehab  with BERG balance test, especially forward reach, single leg and tandem standing and establish goal Baseline: NA yet Goal status: INITIAL  PLAN:  PT FREQUENCY: 2x/week  PT DURATION: 8 weeks  PLANNED INTERVENTIONS: Therapeutic exercises, Therapeutic activity, Neuromuscular re-education, Balance training, Gait training, Patient/Family education, Self Care, and Joint mobilization  PLAN FOR NEXT SESSION: see  the exercises and stretches did and progress as tolerated.  Review home ex and adapt as needed.   Ka Bench L Nashonda Limberg, PT 05/03/2023, 12:01 PM

## 2023-05-09 ENCOUNTER — Ambulatory Visit: Payer: BC Managed Care – PPO

## 2023-05-09 DIAGNOSIS — M542 Cervicalgia: Secondary | ICD-10-CM

## 2023-05-09 DIAGNOSIS — M6281 Muscle weakness (generalized): Secondary | ICD-10-CM | POA: Diagnosis not present

## 2023-05-09 NOTE — Therapy (Signed)
OUTPATIENT PHYSICAL THERAPY CERVICAL TREATMENT    Patient Name: GRAVES NIPP MRN: [id number] DOB:09/22/1961, 62 y.o., male Today's Date: 05/09/2023  END OF SESSION:  PT End of Session - 05/09/23 0944     Visit Number 4    Date for PT Re-Evaluation 06/12/23    Progress Note Due on Visit 10    PT Start Time 0936    PT Stop Time 1016    PT Time Calculation (min) 40 min    Activity Tolerance Patient tolerated treatment well    Behavior During Therapy [facility] for tasks assessed/performed             Past Medical History:  Diagnosis Date   Anxiety    Arthritis    Depression    GERD (gastroesophageal reflux disease)    Headache    Heart murmur    as child   History of MRSA infection    toe on right foot   Hyperlipidemia    Hypertension    Sleep apnea    no CPAP   Past Surgical History:  Procedure Laterality Date   ankle re construction  11/27/2009   bilateral   ANTERIOR CERVICAL DECOMP/DISCECTOMY FUSION N/A 02/28/2023   Procedure: Anterior Cervical Decompression/Discectomy Fusion, Interbody Prosthesis, Plate/Screws Cervical Three-Cervical Four, Cervical Four-Cervical Five, Cervical Five- Cervical Six;  Surgeon: Tressie Stalker, MD;  Location: [facility] OR;  Service: Neurosurgery;  Laterality: N/A;  3C   APPENDECTOMY  11/28/1979   exploratory surgery d/t appendix bursting   Patient Active Problem List   Diagnosis Date Noted   Bilateral hip pain 03/19/2023   Cervical spondylosis with myelopathy and radiculopathy 02/28/2023   Suspicious nevus 11/09/2022   Chronic pain syndrome 11/09/2022   Excessive urination at night 04/28/2022   Statin intolerance 07/12/2021   Primary osteoarthritis of right knee 07/12/2021   Acute pain of right knee 12/22/2020   Preventative health care 09/15/2019   Screening for HIV without presence of risk factors 09/15/2019   Encounter for hepatitis C screening test for low risk patient 09/15/2019   BPH (benign prostatic hyperplasia) 03/31/2016    GERD (gastroesophageal reflux disease) 03/18/2015   ED 10/07/2014   Carpal tunnel syndrome 12/28/2011   Primary hypertension 01/24/2011   SNORING 01/02/2011   Hyperlipidemia 11/01/2010   Anxiety state 11/01/2010   PLANTAR FASCIITIS, BILATERAL 08/15/2010   PAIN IN JOINT, ANKLE AND FOOT 03/10/2010   Unspecified episodic mood disorder 01/06/2010   Insomnia 01/24/2007    PCP: Seabron Spates  REFERRING PROVIDER: Tressie Stalker  REFERRING DIAG: s/p ant cervical fusion C 3 to C6  THERAPY DIAG:  Cervicalgia  Muscle weakness (generalized)  Rationale for Evaluation and Treatment: Rehabilitation  ONSET DATE: 02/28/23  SUBJECTIVE:  SUBJECTIVE STATEMENT: Pt notes a weird feeling in his neck, there is no pain but it just feels weird.  Hand dominance: Right  PERTINENT HISTORY:  Progressive R hand paresthesia and radiculopathy B Ue's for years.  Underwent ant cervical fusion 02/28/23.  Referred to this PT clinic to address his recovery.    PAIN:  Are you having pain? No  PRECAUTIONS: Cervical  WEIGHT BEARING RESTRICTIONS: No  FALLS:  Has patient fallen in last 6 months? 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, my job, and [employer] architect  NEXT MD VISIT: one week for 6 week surgical follow up  OBJECTIVE:   DIAGNOSTIC FINDINGS:  Hardware intact  PATIENT SURVEYS:  NDI 20/50  or 40% impairment  COGNITION: Overall cognitive status: Within functional limits for tasks  assessed  SENSATION: Light touch: Impaired  R thumb impaired LT  POSTURE: rounded shoulders and decreased lumbar lordosis  PALPATION: Na today   CERVICAL ROM: cervical spine ROM deferred at eval as pt in hard collar   UPPER EXTREMITY ROM:  Active ROM Right eval Left eval  Shoulder flexion 110 110  Shoulder extension    Shoulder abduction 95 95  Shoulder adduction    Shoulder extension    Shoulder internal rotation    Shoulder external rotation    Elbow flexion    Elbow extension    Wrist flexion    Wrist extension    Wrist ulnar deviation    Wrist radial deviation    Wrist pronation    Wrist supination     (Blank rows = not tested)  UPPER EXTREMITY MMT:  MMT Right eval Left eval  Shoulder flexion 4 4  Shoulder extension    Shoulder abduction 4 4  Shoulder adduction    Shoulder extension    Shoulder internal rotation    Shoulder external rotation    Middle trapezius    Lower trapezius    Elbow flexion 5 4  Elbow extension 4 4  Wrist flexion 5 5  Wrist extension 5 5  Wrist ulnar deviation    Wrist radial deviation    Wrist pronation    Wrist supination    Grip strength 60 62   (Blank rows = not tested)  CERVICAL SPECIAL TESTS:  Na due to recent cervical surgery  FUNCTIONAL TESTS:  5 times sit to stand: 24.62  Gait: able to walk in clinic I without device.  Noted B shortened stride length, also primarily foot flat contact B  TODAY'S TREATMENT:  DATE: 05/09/23 UBE L3.5 6 min Standing ER B blue TB x 10  Shoulder ext and row blue TB   Wall angels x 10  Wall slides ( windshield wipers) x 10 B Wall push ups 20x  STM to bil UT/levator more tight on R side  05/03/23 Nustep level 6 B UE's and B LE's 7 min Strengthening on the BATCA: Seated rows, 20#, lower grip, 3 x 15 Chest press, vertical grip, 20# 3 x 15 Lat rows, narrow  grip, 35#, facing machine,3 x 15 Seated rows, 20#, upper horizontal grip, 3 x 15 Seated B hamstring curls 35#, 3 x 15 Seated B knee ext,  04/20/23 UBE level 2 x 5 minutes Back to wall red tband ER Red tband horizontal abduction Red tband rows and extension 2x10 5# biceps 2x10 Green tband triceps Wall slides flexion for shoulder ROM Supine abdominal isometrics pushing into ball Supine UE stretches, neural tension Corner stretch for pecs and biceps  04/17/23 : eval and initiated home program, instructed in hip flexor stretch with knee on chair, as well as 5 x sit to stand from chair with pillow in seat to perform 5 x every meal, for 3 x day, as well as ex  below:  PATIENT EDUCATION:  Education details: POC, goals Person educated: Patient Education method: Explanation, Demonstration, Tactile cues, Verbal cues, and Handouts Education comprehension: verbalized understanding, returned demonstration, verbal cues required, tactile cues required, and needs further education  HOME EXERCISE PROGRAM: Access Code: [email or web address] URL: [email or web address] Date: 05/09/2023 Prepared by: Verta Ellen  Exercises - Standing Shoulder External Rotation with Resistance  - 3 x daily - 7 x weekly - 2 sets - 10 reps - Seated Shoulder Row with Resistance Anchored at Feet  - 3 x daily - 7 x weekly - 2 sets - 10 reps - Standing Serratus Punch with Resistance  - 3 x daily - 7 x weekly - 2 sets - 10 reps - Standing Isometric Cervical Sidebending with Manual Resistance  - 1 x daily - 7 x weekly - 1 sets - 10 reps - Standing Isometric Cervical Flexion with Manual Resistance  - 1 x daily - 7 x weekly - 1 sets - 10 reps - Standing Isometric Cervical Extension with Manual Resistance  - 1 x daily - 7 x weekly - 1 sets - 10 reps - Seated Isometric Cervical Rotation  - 1 x daily - 7 x weekly - 1 sets - 10 reps - Shoulder extension with resistance - Neutral  - 1 x daily - 7 x weekly - 3 sets - 10 reps - Wall  Angels  - 1 x daily - 7 x weekly - 3 sets - 10 reps  ASSESSMENT:  CLINICAL IMPRESSION: Pt showed good response to treatment. We continued progressing exercises to tolerance focusing on postural strengthening and upper thoracic mobility. Cues with the ER to keep elbows to the side to isolate external rotators. Very tight with the wall angels. Pt had no complaints of pain after session.  OBJECTIVE IMPAIRMENTS: decreased activity tolerance, decreased endurance, decreased mobility, difficulty walking, decreased ROM, decreased strength, hypomobility, increased muscle spasms, impaired flexibility, impaired sensation, impaired UE functional use, postural dysfunction, and pain.   ACTIVITY LIMITATIONS: carrying, lifting, bending, standing, squatting, sleeping, stairs, transfers, bed mobility, reach over head, and locomotion level  PARTICIPATION LIMITATIONS: meal prep, laundry, shopping, community activity, occupation, and yard work  PERSONAL FACTORS: Fitness, Past/current experiences, Profession, Time since onset of injury/illness/exacerbation, and 1 comorbidity: lumbar radiculopathy  are  also affecting patient's functional outcome.   REHAB POTENTIAL: Good  CLINICAL DECISION MAKING: Stable/uncomplicated  EVALUATION COMPLEXITY: Low   GOALS: Goals reviewed with patient? Yes  SHORT TERM GOALS: Target date: 2 weeks, 05/01/23  I HEP  Baseline: initiated at eval Goal status: MET- 05/09/23    LONG TERM GOALS: Target date: 06/12/23  Improve NDI from 20/50 to 10/50 Baseline: 20/50 Goal status: INITIAL  2.  Improve 5 x sit to stand to less than 14 sec Baseline: 24.62 Goal status: INITIAL  3.  Improve B UE strength overall to 5/5 with MMT Baseline: L biceps, triceps 4-/5, all other MMT Ue's 4/5 Goal status: INITIAL  4. Assess balance within first 2 weeks of rehab with BERG balance test, especially forward reach, single leg and tandem standing and establish goal Baseline: NA yet Goal status:  INITIAL  PLAN:  PT FREQUENCY: 2x/week  PT DURATION: 8 weeks  PLANNED INTERVENTIONS: Therapeutic exercises, Therapeutic activity, Neuromuscular re-education, Balance training, Gait training, Patient/Family education, Self Care, and Joint mobilization  PLAN FOR NEXT SESSION: continue progressing postural strength; Review home ex and adapt as needed.   Darleene Cleaver, PTA 05/09/2023, 10:16 AM

## 2023-05-18 ENCOUNTER — Other Ambulatory Visit: Payer: Self-pay | Admitting: Family Medicine

## 2023-05-18 ENCOUNTER — Ambulatory Visit: Payer: BC Managed Care – PPO

## 2023-05-18 DIAGNOSIS — F419 Anxiety disorder, unspecified: Secondary | ICD-10-CM

## 2023-05-18 MED ORDER — DIAZEPAM 10 MG PO TABS: 10.0000 mg | ORAL_TABLET | Freq: Three times a day (TID) | ORAL | 0 refills | Status: DC

## 2023-05-18 MED ORDER — TRAMADOL HCL 50 MG PO TABS: 50.0000 mg | ORAL_TABLET | Freq: Three times a day (TID) | ORAL | 0 refills | Status: DC | PRN

## 2023-05-18 NOTE — Addendum Note (Signed)
Addended by: Roxanne Gates on: 05/18/2023 01:55 PM   Modules accepted: Orders

## 2023-05-18 NOTE — Telephone Encounter (Signed)
Requesting: Valium Contract: 03/19/23 UDS: 03/19/23 Last OV: 03/19/23 Next OV: N/A Last Refill: 04/19/23, #90--0 RF Database:   Requesting: Tramadol Contract: UDS: Last OV: Next OV: Last Refill: 04/19/23, #90--0 RF Database:   Please advise

## 2023-05-18 NOTE — Telephone Encounter (Signed)
Patient called and would a med refill on the Diazepam 10mg  and Tramadol 50mg . Please send to [facility] on [address].

## 2023-05-21 ENCOUNTER — Other Ambulatory Visit: Payer: Self-pay | Admitting: Pharmacist

## 2023-05-21 MED ORDER — REPATHA SURECLICK 140 MG/ML ~~LOC~~ SOAJ: 140.0000 mg | SUBCUTANEOUS | 3 refills | Status: DC

## 2023-05-22 ENCOUNTER — Ambulatory Visit: Payer: BC Managed Care – PPO

## 2023-05-22 ENCOUNTER — Telehealth: Payer: Self-pay | Admitting: Internal Medicine

## 2023-05-22 DIAGNOSIS — M542 Cervicalgia: Secondary | ICD-10-CM | POA: Diagnosis not present

## 2023-05-22 DIAGNOSIS — M6281 Muscle weakness (generalized): Secondary | ICD-10-CM | POA: Diagnosis not present

## 2023-05-22 MED ORDER — REPATHA SURECLICK 140 MG/ML ~~LOC~~ SOAJ: 140.0000 mg | SUBCUTANEOUS | 3 refills | Status: DC

## 2023-05-22 NOTE — Telephone Encounter (Signed)
*  STAT* If patient is at the pharmacy, call can be transferred to refill team.   1. Which medications need to be refilled? (please list name of each medication and dose if known) Evolocumab (REPATHA SURECLICK) 140 MG/ML SOAJ   2. Which pharmacy/location (including street and city if local pharmacy) is medication to be sent to?  [id number] - [place], [place] - [address] AT [address]OSS [address]    3. Do they need a 30 day or 90 day supply? 90   Patient has appt on 7/18

## 2023-05-22 NOTE — Therapy (Addendum)
OUTPATIENT PHYSICAL THERAPY CERVICAL TREATMENT    Patient Name: Jason Williams SEARCH MRN: [id number] DOB:Oct 05, 1961, 62 y.o., male Today's Date: 05/22/2023  END OF SESSION:  PT End of Session - 05/22/23 1019     Visit Number 5    Date for PT Re-Evaluation 06/12/23    Progress Note Due on Visit 10    PT Start Time 1017    PT Stop Time 1101    PT Time Calculation (min) 44 min    Activity Tolerance Patient tolerated treatment well    Behavior During Therapy WFL for tasks assessed/performed             Past Medical History:  Diagnosis Date   Anxiety    Arthritis    Depression    GERD (gastroesophageal reflux disease)    Headache    Heart murmur    as child   History of MRSA infection    toe on right foot   Hyperlipidemia    Hypertension    Sleep apnea    no CPAP   Past Surgical History:  Procedure Laterality Date   ankle re construction  11/27/2009   bilateral   ANTERIOR CERVICAL DECOMP/DISCECTOMY FUSION N/A 02/28/2023   Procedure: Anterior Cervical Decompression/Discectomy Fusion, Interbody Prosthesis, Plate/Screws Cervical Three-Cervical Four, Cervical Four-Cervical Five, Cervical Five- Cervical Six;  Surgeon: Tressie Stalker, MD;  Location: [facility] OR;  Service: Neurosurgery;  Laterality: N/A;  3C   APPENDECTOMY  11/28/1979   exploratory surgery d/t appendix bursting   Patient Active Problem List   Diagnosis Date Noted   Bilateral hip pain 03/19/2023   Cervical spondylosis with myelopathy and radiculopathy 02/28/2023   Suspicious nevus 11/09/2022   Chronic pain syndrome 11/09/2022   Excessive urination at night 04/28/2022   Statin intolerance 07/12/2021   Primary osteoarthritis of right knee 07/12/2021   Acute pain of right knee 12/22/2020   Preventative health care 09/15/2019   Screening for HIV without presence of risk factors 09/15/2019   Encounter for hepatitis C screening test for low risk patient 09/15/2019   BPH (benign prostatic hyperplasia) 03/31/2016    GERD (gastroesophageal reflux disease) 03/18/2015   ED 10/07/2014   Carpal tunnel syndrome 12/28/2011   Primary hypertension 01/24/2011   SNORING 01/02/2011   Hyperlipidemia 11/01/2010   Anxiety state 11/01/2010   PLANTAR FASCIITIS, BILATERAL 08/15/2010   PAIN IN JOINT, ANKLE AND FOOT 03/10/2010   Unspecified episodic mood disorder 01/06/2010   Insomnia 01/24/2007    PCP: Seabron Spates  REFERRING PROVIDER: Tressie Stalker  REFERRING DIAG: s/p ant cervical fusion C 3 to C6  THERAPY DIAG:  Cervicalgia  Muscle weakness (generalized)  Rationale for Evaluation and Treatment: Rehabilitation  ONSET DATE: 02/28/23  SUBJECTIVE:  SUBJECTIVE STATEMENT: Pt reports numbness in R index finger, every now again he will have some pain down L side of neck. Hand dominance: Right  PERTINENT HISTORY:  Progressive R hand paresthesia and radiculopathy B Ue's for years.  Underwent ant cervical fusion 02/28/23.  Referred to this PT clinic to address his recovery.    PAIN:  Are you having pain? No  PRECAUTIONS: Cervical  WEIGHT BEARING RESTRICTIONS: No  FALLS:  Has patient fallen in last 6 months? 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, my job, and Naval architect  NEXT MD VISIT: one week for 6 week surgical follow up  OBJECTIVE:   DIAGNOSTIC FINDINGS:  Hardware intact  PATIENT SURVEYS:  NDI 20/50  or 40% impairment  COGNITION: Overall cognitive status: Within functional limits for tasks  assessed  SENSATION: Light touch: Impaired  R thumb impaired LT  POSTURE: rounded shoulders and decreased lumbar lordosis  PALPATION: Na today   CERVICAL ROM: cervical spine ROM deferred at eval as pt in hard collar   UPPER EXTREMITY ROM:  Active ROM Right eval Left eval  Shoulder flexion 110 110  Shoulder extension    Shoulder abduction 95 95  Shoulder adduction    Shoulder extension    Shoulder internal rotation    Shoulder external rotation    Elbow flexion    Elbow extension    Wrist flexion    Wrist extension    Wrist ulnar deviation    Wrist radial deviation    Wrist pronation    Wrist supination     (Blank rows = not tested)  UPPER EXTREMITY MMT:  MMT Right eval Left eval Right 05/22/23 Left 05/22/23  Shoulder flexion 4 4 5 5   Shoulder extension      Shoulder abduction 4 4 5  4+  Shoulder adduction      Shoulder extension      Shoulder internal rotation      Shoulder external rotation   5 5  Middle trapezius      Lower trapezius      Elbow flexion 5 4 5 5   Elbow extension 4 4 5 5   Wrist flexion 5 5    Wrist extension 5 5    Wrist ulnar deviation      Wrist radial deviation      Wrist pronation      Wrist supination      Grip strength 60 62     (Blank rows = not tested)  CERVICAL SPECIAL TESTS:  Na due to recent cervical surgery  FUNCTIONAL TESTS:  5 times sit to stand: 24.62  Gait: able to walk in clinic I without device.  Noted B shortened stride length, also primarily foot flat contact B  TODAY'S TREATMENT:  DATE: 05/22/23 UBE L4.0 3 min fwd/ 3 min back Reassessed UE strength Standing lateral raises 5lb DB 2x10 Standing front raises 5lb DB 2x10 Cable shoulder extension 25# 2x10 BATCA rows 45# high grips 2x10 Chest press 25# x 20 Chest fly 45# x 20 STM to L LS and UT   05/09/23 UBE L3.5 6 min Standing ER B  blue TB x 10  Shoulder ext and row blue TB   Wall angels x 10  Wall slides ( windshield wipers) x 10 B Wall push ups 20x  STM to bil UT/levator more tight on R side  05/03/23 Nustep level 6 B UE's and B LE's 7 min Strengthening on the BATCA: Seated rows, 20#, lower grip, 3 x 15 Chest press, vertical grip, 20# 3 x 15 Lat rows, narrow grip, 35#, facing machine,3 x 15 Seated rows, 20#, upper horizontal grip, 3 x 15 Seated B hamstring curls 35#, 3 x 15 Seated B knee ext,  04/20/23 UBE level 2 x 5 minutes Back to wall red tband ER Red tband horizontal abduction Red tband rows and extension 2x10 5# biceps 2x10 Green tband triceps Wall slides flexion for shoulder ROM Supine abdominal isometrics pushing into ball Supine UE stretches, neural tension Corner stretch for pecs and biceps  04/17/23 : eval and initiated home program, instructed in hip flexor stretch with knee on chair, as well as 5 x sit to stand from chair with pillow in seat to perform 5 x every meal, for 3 x day, as well as ex  below:  PATIENT EDUCATION:  Education details: POC, goals Person educated: Patient Education method: Explanation, Demonstration, Tactile cues, Verbal cues, and Handouts Education comprehension: verbalized understanding, returned demonstration, verbal cues required, tactile cues required, and needs further education  HOME EXERCISE PROGRAM: Access Code: [email or web address] URL: [email or web address] Date: 05/09/2023 Prepared by: Verta Ellen  Exercises - Standing Shoulder External Rotation with Resistance  - 3 x daily - 7 x weekly - 2 sets - 10 reps - Seated Shoulder Row with Resistance Anchored at Feet  - 3 x daily - 7 x weekly - 2 sets - 10 reps - Standing Serratus Punch with Resistance  - 3 x daily - 7 x weekly - 2 sets - 10 reps - Standing Isometric Cervical Sidebending with Manual Resistance  - 1 x daily - 7 x weekly - 1 sets - 10 reps - Standing Isometric Cervical Flexion with Manual  Resistance  - 1 x daily - 7 x weekly - 1 sets - 10 reps - Standing Isometric Cervical Extension with Manual Resistance  - 1 x daily - 7 x weekly - 1 sets - 10 reps - Seated Isometric Cervical Rotation  - 1 x daily - 7 x weekly - 1 sets - 10 reps - Shoulder extension with resistance - Neutral  - 1 x daily - 7 x weekly - 3 sets - 10 reps - Wall Angels  - 1 x daily - 7 x weekly - 3 sets - 10 reps  ASSESSMENT:  CLINICAL IMPRESSION: Pt shows improved B UE strength the most weakness is with L shoulder ABD. Focused on machine interventions to progressivly increase resistance.  Pt had reports of pain along the UT/LS muscle belly, so I did some STM which helped. Pt demonstrates good tolerance for exercises.  OBJECTIVE IMPAIRMENTS: decreased activity tolerance, decreased endurance, decreased mobility, difficulty walking, decreased ROM, decreased strength, hypomobility, increased muscle spasms, impaired flexibility, impaired sensation, impaired UE functional use, postural dysfunction, and pain.  ACTIVITY LIMITATIONS: carrying, lifting, bending, standing, squatting, sleeping, stairs, transfers, bed mobility, reach over head, and locomotion level  PARTICIPATION LIMITATIONS: meal prep, laundry, shopping, community activity, occupation, and yard work  PERSONAL FACTORS: Fitness, Past/current experiences, Profession, Time since onset of injury/illness/exacerbation, and 1 comorbidity: lumbar radiculopathy  are also affecting patient's functional outcome.   REHAB POTENTIAL: Good  CLINICAL DECISION MAKING: Stable/uncomplicated  EVALUATION COMPLEXITY: Low   GOALS: Goals reviewed with patient? Yes  SHORT TERM GOALS: Target date: 2 weeks, 05/01/23  I HEP  Baseline: initiated at eval Goal status: MET- 05/09/23    LONG TERM GOALS: Target date: 06/12/23  Improve NDI from 20/50 to 10/50 Baseline: 20/50 Goal status: INITIAL  2.  Improve 5 x sit to stand to less than 14 sec Baseline: 24.62 Goal status:  INITIAL  3.  Improve B UE strength overall to 5/5 with MMT Baseline: L biceps, triceps 4-/5, all other MMT Ue's 4/5 Goal status: IN PROGRESS- 05/22/23- L shoulder ABD 4+/5  4. Assess balance within first 2 weeks of rehab with BERG balance test, especially forward reach, single leg and tandem standing and establish goal Baseline: NA yet Goal status: INITIAL  PLAN:  PT FREQUENCY: 2x/week  PT DURATION: 8 weeks  PLANNED INTERVENTIONS: Therapeutic exercises, Therapeutic activity, Neuromuscular re-education, Balance training, Gait training, Patient/Family education, Self Care, and Joint mobilization  PLAN FOR NEXT SESSION: continue progressing postural strength; Review home ex and adapt as needed.  06/05/23:  The patient called and requested DC from PT, able to manage on his own now.  Early Chars, PT, DPT Board-certified Specialist in Orthopaedic Physical Therapy   Darleene Cleaver, PTA 05/22/2023, 11:02 AM

## 2023-05-25 ENCOUNTER — Ambulatory Visit: Payer: BC Managed Care – PPO

## 2023-06-05 DIAGNOSIS — M4712 Other spondylosis with myelopathy, cervical region: Secondary | ICD-10-CM | POA: Diagnosis not present

## 2023-06-11 ENCOUNTER — Encounter: Payer: BC Managed Care – PPO | Admitting: Physical Therapy

## 2023-06-14 ENCOUNTER — Ambulatory Visit: Payer: BC Managed Care – PPO | Attending: Internal Medicine | Admitting: Internal Medicine

## 2023-06-15 ENCOUNTER — Other Ambulatory Visit: Payer: Self-pay | Admitting: Family Medicine

## 2023-06-15 ENCOUNTER — Telehealth: Payer: Self-pay | Admitting: Family Medicine

## 2023-06-15 DIAGNOSIS — M25551 Pain in right hip: Secondary | ICD-10-CM

## 2023-06-15 DIAGNOSIS — G4700 Insomnia, unspecified: Secondary | ICD-10-CM

## 2023-06-15 MED ORDER — TRAMADOL HCL 50 MG PO TABS
50.0000 mg | ORAL_TABLET | Freq: Three times a day (TID) | ORAL | 0 refills | Status: DC | PRN
Start: 2023-06-15 — End: 2023-07-19

## 2023-06-15 MED ORDER — ZOLPIDEM TARTRATE 10 MG PO TABS: ORAL_TABLET | ORAL | 0 refills | Status: DC

## 2023-06-15 NOTE — Telephone Encounter (Signed)
Patient is needing a med refill on traMADol (ULTRAM) 50 MG tablet and  zolpidem (AMBIEN) 10 MG tablet. [facility] [address].

## 2023-06-15 NOTE — Telephone Encounter (Signed)
Requesting: tramadol 50mg  and zolpidem 10mg  Contract: No UDS: 03/19/23 Last Visit: 03/19/2023 Next Visit: Visit date not found Last Refill: tramadol 05/18/23 and zolpidem 10mg   Please Advise

## 2023-06-16 ENCOUNTER — Other Ambulatory Visit: Payer: Self-pay | Admitting: Family Medicine

## 2023-06-16 DIAGNOSIS — F419 Anxiety disorder, unspecified: Secondary | ICD-10-CM

## 2023-06-18 ENCOUNTER — Other Ambulatory Visit: Payer: Self-pay | Admitting: Family Medicine

## 2023-06-18 DIAGNOSIS — F419 Anxiety disorder, unspecified: Secondary | ICD-10-CM

## 2023-06-18 MED ORDER — DIAZEPAM 10 MG PO TABS: 10.0000 mg | ORAL_TABLET | Freq: Three times a day (TID) | ORAL | 0 refills | Status: DC

## 2023-06-18 NOTE — Telephone Encounter (Signed)
Pt notified that rx was sent in 

## 2023-06-18 NOTE — Telephone Encounter (Signed)
Pt did not need the zolpidem, he needed diazepam.  We had wrote down wrong medication when we first took call.  Requesting: diazepam 10mg  Contract: No UDS: 03/19/23 Last Visit: 03/19/2023 Next Visit: none Last Refill: 05/18/23  Please Advise

## 2023-06-20 ENCOUNTER — Other Ambulatory Visit: Payer: Self-pay | Admitting: Family Medicine

## 2023-06-20 DIAGNOSIS — I10 Essential (primary) hypertension: Secondary | ICD-10-CM

## 2023-07-17 ENCOUNTER — Telehealth: Payer: Self-pay | Admitting: Family Medicine

## 2023-07-17 ENCOUNTER — Other Ambulatory Visit: Payer: Self-pay | Admitting: Family Medicine

## 2023-07-17 DIAGNOSIS — M25552 Pain in left hip: Secondary | ICD-10-CM

## 2023-07-17 DIAGNOSIS — F419 Anxiety disorder, unspecified: Secondary | ICD-10-CM

## 2023-07-17 MED ORDER — DIAZEPAM 10 MG PO TABS: 10.0000 mg | ORAL_TABLET | Freq: Three times a day (TID) | ORAL | 0 refills | Status: DC

## 2023-07-17 NOTE — Telephone Encounter (Signed)
Prescription Request  07/17/2023  Is this a "Controlled Substance" medicine? No  LOV: 03/19/2023  What is the name of the medication or equipment? Tramadol and Diazepam   Have you contacted your pharmacy to request a refill? No   Which pharmacy would you like this sent to?  [id number] [place], [place] - [address] [place] [address] Phone: [phone] Fax: [phone]      Patient notified that their request is being sent to the clinical staff for review and that they should receive a response within 2 business days.   Please advise at Mobile [phone] (mobile)

## 2023-07-17 NOTE — Addendum Note (Signed)
Addended by: Roxanne Gates on: 07/17/2023 10:29 AM   Modules accepted: Orders

## 2023-07-17 NOTE — Telephone Encounter (Signed)
Requesting: Valium Contract: 03/19/23 UDS: 03/19/23 Last OV: 03/19/23 Next OV: N/A Last Refill: 06/18/23, #90--0 RF Database:   Requesting: Tramadol Contract: UDS: Last OV: Next OV: Last Refill: 06/15/23, #90--0 RF Database:   Please advise

## 2023-07-17 NOTE — Telephone Encounter (Signed)
Pt requesting MRI on hip/back. Pt said he has been dealing with this for years and has spoken to Dr. Laury Axon about it. Please call to discuss/advise.

## 2023-07-18 NOTE — Telephone Encounter (Signed)
Rx was not sent. Please send

## 2023-07-18 NOTE — Telephone Encounter (Signed)
The pt called & [facility], the Tramadol was not sent in. Please refill medication.

## 2023-07-19 ENCOUNTER — Other Ambulatory Visit: Payer: Self-pay | Admitting: Family Medicine

## 2023-07-19 DIAGNOSIS — M25551 Pain in right hip: Secondary | ICD-10-CM

## 2023-07-19 MED ORDER — TRAMADOL HCL 50 MG PO TABS
50.0000 mg | ORAL_TABLET | Freq: Three times a day (TID) | ORAL | 0 refills | Status: DC | PRN
Start: 2023-07-19 — End: 2023-08-20

## 2023-08-08 ENCOUNTER — Other Ambulatory Visit: Payer: Self-pay | Admitting: Family Medicine

## 2023-08-08 DIAGNOSIS — G8929 Other chronic pain: Secondary | ICD-10-CM

## 2023-08-08 NOTE — Telephone Encounter (Signed)
Pt called. Made him aware of order and should be getting a call from imaging if approved.

## 2023-08-08 NOTE — Telephone Encounter (Signed)
Pt requesting a MRI on his knee. He believes he has a tear. The ortho doctors keep giving him injections but that is not long lasting. They are not ordering MRIs so he wants to know if Dr. Laury Axon can order the MRI on his knee so he can get this resolved. Pt didn't receive a call back from previous call.

## 2023-08-20 ENCOUNTER — Other Ambulatory Visit: Payer: Self-pay | Admitting: Family Medicine

## 2023-08-20 DIAGNOSIS — G4700 Insomnia, unspecified: Secondary | ICD-10-CM

## 2023-08-20 DIAGNOSIS — F419 Anxiety disorder, unspecified: Secondary | ICD-10-CM

## 2023-08-20 DIAGNOSIS — M25551 Pain in right hip: Secondary | ICD-10-CM

## 2023-08-20 MED ORDER — TRAMADOL HCL 50 MG PO TABS
50.0000 mg | ORAL_TABLET | Freq: Three times a day (TID) | ORAL | 0 refills | Status: DC | PRN
Start: 2023-08-20 — End: 2023-09-21

## 2023-08-20 MED ORDER — ZOLPIDEM TARTRATE 10 MG PO TABS
ORAL_TABLET | ORAL | 0 refills | Status: DC
Start: 2023-08-20 — End: 2023-11-30

## 2023-08-20 NOTE — Telephone Encounter (Signed)
**  Pt stated there has been instances where one medication was sent but not the other. Please note there are 2 medications being requested.**  Prescription Request  08/20/2023  Is this a "Controlled Substance" medicine? Yes  LOV: 03/19/2023  What is the name of the medication or equipment?   -traMADol (ULTRAM) 50 MG tablet [[id number]]  -diazepam (VALIUM) 10 MG tablet [[id number]]  Have you contacted your pharmacy to request a refill? No   Which pharmacy would you like this sent to?  [id number] [place], [place] - [address] [place] [address] Phone: [phone] Fax: [phone]   Patient notified that their request is being sent to the clinical staff for review and that they should receive a response within 2 business days.   Please advise at Mobile [phone] (mobile)

## 2023-08-20 NOTE — Telephone Encounter (Signed)
Requesting: Tramadol Contract: 03/19/23 UDS: 03/19/23 Last OV: 03/19/23 Next OV: n/a Last Refill: 07/19/23, #90--0 RF Database:   Requesting: Ambien Contract: UDS: Last OV: Next OV: Last Refill: 06/15/2023, #90--0 RF Database:   Please advise

## 2023-08-20 NOTE — Addendum Note (Signed)
Addended by: Roxanne Gates on: 08/20/2023 03:39 PM   Modules accepted: Orders

## 2023-08-21 NOTE — Telephone Encounter (Signed)
Pt called back to f/u. Please advise.

## 2023-08-21 NOTE — Telephone Encounter (Signed)
Refills were sent in yesterday.

## 2023-08-22 ENCOUNTER — Other Ambulatory Visit: Payer: Self-pay | Admitting: Family Medicine

## 2023-08-22 DIAGNOSIS — F419 Anxiety disorder, unspecified: Secondary | ICD-10-CM

## 2023-08-23 MED ORDER — DIAZEPAM 10 MG PO TABS
10.0000 mg | ORAL_TABLET | Freq: Three times a day (TID) | ORAL | 0 refills | Status: DC
Start: 2023-08-23 — End: 2023-09-21

## 2023-08-23 NOTE — Telephone Encounter (Signed)
Pt states he does not need ambien but needed a refill on valium. He states this happens every month and would like it called in as soon as possible.

## 2023-08-23 NOTE — Addendum Note (Signed)
Addended by: Roxanne Gates on: 08/23/2023 02:33 PM   Modules accepted: Orders

## 2023-08-23 NOTE — Telephone Encounter (Signed)
*  Pt did not want Ambien refilled, but Valium.    Requesting: Valium Contract: 03/19/23 UDS: 03/19/23 Last OV: 03/19/23 Next OV: n/a  Last Refill: 07/17/23, #90--0 RF Database:   Please advise

## 2023-09-12 ENCOUNTER — Encounter: Payer: Self-pay | Admitting: Family Medicine

## 2023-09-14 ENCOUNTER — Ambulatory Visit
Admission: RE | Admit: 2023-09-14 | Discharge: 2023-09-14 | Disposition: A | Discharge status: Home / Self Care | Payer: BC Managed Care – PPO | Source: Ambulatory Visit | Attending: Family Medicine | Admitting: Family Medicine

## 2023-09-14 DIAGNOSIS — G8929 Other chronic pain: Secondary | ICD-10-CM | POA: Diagnosis not present

## 2023-09-14 DIAGNOSIS — M23321 Other meniscus derangements, posterior horn of medial meniscus, right knee: Secondary | ICD-10-CM | POA: Diagnosis not present

## 2023-09-14 DIAGNOSIS — M25561 Pain in right knee: Secondary | ICD-10-CM | POA: Diagnosis not present

## 2023-09-21 ENCOUNTER — Other Ambulatory Visit: Payer: Self-pay | Admitting: Family Medicine

## 2023-09-21 DIAGNOSIS — M25551 Pain in right hip: Secondary | ICD-10-CM

## 2023-09-21 DIAGNOSIS — F419 Anxiety disorder, unspecified: Secondary | ICD-10-CM

## 2023-09-21 MED ORDER — DIAZEPAM 10 MG PO TABS: 10.0000 mg | ORAL_TABLET | Freq: Three times a day (TID) | ORAL | 0 refills | Status: DC

## 2023-09-21 MED ORDER — TRAMADOL HCL 50 MG PO TABS
50.0000 mg | ORAL_TABLET | Freq: Three times a day (TID) | ORAL | 0 refills | Status: DC | PRN
Start: 2023-09-21 — End: 2023-10-22

## 2023-09-21 NOTE — Telephone Encounter (Signed)
Medication: traMADol (ULTRAM) 50 MG tablet  diazepam (VALIUM) 10 MG tablet  Has the patient contacted their pharmacy? No.   [id number] [place] [address] [place] [address]Phone: [phone] Fax: [phone]

## 2023-09-21 NOTE — Telephone Encounter (Signed)
Requesting: Tramadol Contract: 03/19/23 UDS: 03/19/23 Last OV: 03/19/23 Next OV: n/a Last Refill: 08/20/2023, #90--0 RF Database:   Requesting: Valium Contract:  UDS: Last OV: Next OV: Last Refill:  08/23/23, #90--0 RF Database:   Please advise

## 2023-09-24 ENCOUNTER — Telehealth: Payer: Self-pay | Admitting: Family Medicine

## 2023-09-24 ENCOUNTER — Other Ambulatory Visit: Payer: Self-pay | Admitting: Family Medicine

## 2023-09-24 DIAGNOSIS — I10 Essential (primary) hypertension: Secondary | ICD-10-CM

## 2023-09-24 NOTE — Telephone Encounter (Signed)
See prev note

## 2023-09-24 NOTE — Telephone Encounter (Signed)
Pt called back and stated that he will do research to see where he wants his referral sent to and then give [place] a call back.

## 2023-09-24 NOTE — Telephone Encounter (Signed)
Patient called and saw his results In mychart regarding his knee. He would like a call back on what are the next steps. Please call

## 2023-09-24 NOTE — Telephone Encounter (Signed)
Imaging has not been reviewed yet.

## 2023-09-25 NOTE — Telephone Encounter (Signed)
Noted  

## 2023-10-10 NOTE — Telephone Encounter (Signed)
Pt called and stated that he does not have a preference on where he needs to go to have surgery on his knee and he is okay with wherever PCP thinks is best. Please call and advise pt.

## 2023-10-11 ENCOUNTER — Other Ambulatory Visit: Payer: Self-pay | Admitting: Family Medicine

## 2023-10-11 DIAGNOSIS — S83241A Other tear of medial meniscus, current injury, right knee, initial encounter: Secondary | ICD-10-CM

## 2023-10-22 ENCOUNTER — Other Ambulatory Visit: Payer: Self-pay | Admitting: Family Medicine

## 2023-10-22 DIAGNOSIS — M25551 Pain in right hip: Secondary | ICD-10-CM

## 2023-10-22 DIAGNOSIS — N401 Enlarged prostate with lower urinary tract symptoms: Secondary | ICD-10-CM | POA: Diagnosis not present

## 2023-10-22 DIAGNOSIS — F419 Anxiety disorder, unspecified: Secondary | ICD-10-CM

## 2023-10-22 DIAGNOSIS — R351 Nocturia: Secondary | ICD-10-CM | POA: Diagnosis not present

## 2023-10-22 DIAGNOSIS — R3912 Poor urinary stream: Secondary | ICD-10-CM | POA: Diagnosis not present

## 2023-10-22 MED ORDER — TRAMADOL HCL 50 MG PO TABS
50.0000 mg | ORAL_TABLET | Freq: Three times a day (TID) | ORAL | 0 refills | Status: DC | PRN
Start: 2023-10-22 — End: 2023-11-19

## 2023-10-22 MED ORDER — DIAZEPAM 10 MG PO TABS: 10.0000 mg | ORAL_TABLET | Freq: Three times a day (TID) | ORAL | 0 refills | Status: DC

## 2023-10-22 NOTE — Telephone Encounter (Signed)
**  Pt called requesting the following medications be sent in today due to him leaving town tomorrow (11.26.24). Routed HP due to time sensitivity.**  Prescription Request  10/22/2023  Is this a "Controlled Substance" medicine? Yes  LOV: Visit date not found  What is the name of the medication or equipment?   diazepam (VALIUM) 10 MG tablet [[id number]]  traMADol (ULTRAM) 50 MG tablet [[id number]]  Have you contacted your pharmacy to request a refill? No   Which pharmacy would you like this sent to?   [id number] [place], [place] - [address] [place] [address] Phone: [phone] Fax: [phone]  Patient notified that their request is being sent to the clinical staff for review and that they should receive a response within 2 business days.   Please advise at Mobile [phone] (mobile)

## 2023-10-22 NOTE — Telephone Encounter (Signed)
Requesting: Valium Contract: 03/19/23 UDS: 03/19/23 Last OV: 03/19/23 Next OV: n/a Last Refill: 09/21/23, #90--0 RF Database:  Requesting: Tramadol Contract: UDS: Last OV: Next OV: Last Refill: 09/21/2023, #90--0 RF Database:   Please advise

## 2023-10-22 NOTE — Addendum Note (Signed)
Addended by: Roxanne Gates on: 10/22/2023 02:16 PM   Modules accepted: Orders

## 2023-10-23 ENCOUNTER — Encounter: Payer: Self-pay | Admitting: Orthopaedic Surgery

## 2023-10-23 ENCOUNTER — Ambulatory Visit: Payer: BC Managed Care – PPO | Admitting: Orthopaedic Surgery

## 2023-10-23 DIAGNOSIS — S83241A Other tear of medial meniscus, current injury, right knee, initial encounter: Secondary | ICD-10-CM | POA: Diagnosis not present

## 2023-10-23 NOTE — Progress Notes (Signed)
Office Visit Note   Patient: Jason Williams           Date of Birth: September 20, 1961           MRN: [id number] Visit Date: 10/23/2023              Requested by: [address], [place], [place] [address] [place],  [place] [address] PCP: Zola Button, Grayling Congress, DO   Assessment & Plan: Visit Diagnoses:  1. Acute medial meniscus tear of right knee, initial encounter     Plan: Valgene is a 62 year old gentleman male with a complex tear of the medial meniscus of the right knee.  MRI reviewed with him today.  Based on these findings I have recommended arthroscopic medial meniscal debridement.  Details of the surgery including risk benefits prognosis reviewed.  Questions encouraged and answered.  Eunice Blase will call him in the near future to confirm surgery date.  Follow-Up Instructions: No follow-ups on file.   Orders:  No orders of the defined types were placed in this encounter.  No orders of the defined types were placed in this encounter.     Procedures: No procedures performed   Clinical Data: No additional findings.   Subjective: Chief Complaint  Patient presents with   Right Knee - Pain    HPI [name] is a 62 year old gentleman who works for [employer] comes in for surgical consultation.  He is a referral from Dr. Zola Button his PCP.  He has had increasing pain for the last 5 years.  He has been to the sports medicine clinic numerous times and has had 2 knee aspirations and numerous injections.  He had an MRI recently due to continued knee pain which showed a complex tear of the medial meniscus.  He had a prior MRI in 2022 that did not show a meniscal tear.  He denies any specific injuries.  He has medial and posterior knee pain.  Denies any swelling.  Currently takes tramadol for breakthrough pain.  Review of Systems  Constitutional: Negative.   HENT: Negative.    Eyes: Negative.   Respiratory: Negative.    Cardiovascular: Negative.   Gastrointestinal: Negative.    Endocrine: Negative.   Genitourinary: Negative.   Skin: Negative.   Allergic/Immunologic: Negative.   Neurological: Negative.   Hematological: Negative.   Psychiatric/Behavioral: Negative.    All other systems reviewed and are negative.    Objective: Vital Signs: There were no vitals taken for this visit.  Physical Exam Vitals and nursing note reviewed.  Constitutional:      Appearance: He is well-developed.  HENT:     Head: Normocephalic and atraumatic.  Eyes:     Pupils: Pupils are equal, round, and reactive to light.  Pulmonary:     Effort: Pulmonary effort is normal.  Abdominal:     Palpations: Abdomen is soft.  Musculoskeletal:        General: Normal range of motion.     Cervical back: Neck supple.  Skin:    General: Skin is warm.  Neurological:     Mental Status: He is alert and oriented to person, place, and time.  Psychiatric:        Behavior: Behavior normal.        Thought Content: Thought content normal.        Judgment: Judgment normal.     Ortho Exam Exam of the right knee shows medial joint line tenderness.  No joint effusion.  Normal range  of motion.  Collaterals and cruciates are stable. Specialty Comments:  No specialty comments available.  Imaging: No results found.   PMFS History: Patient Active Problem List   Diagnosis Date Noted   Bilateral hip pain 03/19/2023   Cervical spondylosis with myelopathy and radiculopathy 02/28/2023   Suspicious nevus 11/09/2022   Chronic pain syndrome 11/09/2022   Excessive urination at night 04/28/2022   Statin intolerance 07/12/2021   Primary osteoarthritis of right knee 07/12/2021   Acute pain of right knee 12/22/2020   Preventative health care 09/15/2019   Screening for HIV without presence of risk factors 09/15/2019   Encounter for hepatitis C screening test for low risk patient 09/15/2019   BPH (benign prostatic hyperplasia) 03/31/2016   GERD (gastroesophageal reflux disease) 03/18/2015   ED  10/07/2014   Carpal tunnel syndrome 12/28/2011   Primary hypertension 01/24/2011   SNORING 01/02/2011   Hyperlipidemia 11/01/2010   Anxiety state 11/01/2010   PLANTAR FASCIITIS, BILATERAL 08/15/2010   PAIN IN JOINT, ANKLE AND FOOT 03/10/2010   Episodic mood disorder (HCC) 01/06/2010   Insomnia 01/24/2007   Past Medical History:  Diagnosis Date   Anxiety    Arthritis    Depression    GERD (gastroesophageal reflux disease)    Headache    Heart murmur    as child   History of MRSA infection    toe on right foot   Hyperlipidemia    Hypertension    Sleep apnea    no CPAP    Family History  Problem Relation Age of Onset   Esophageal cancer Mother    Heart attack Father        Died of MI at 10   Mental illness Father    Healthy Sister    Healthy Sister    Heart disease Maternal Grandfather        MI/CABG   Heart attack Maternal Grandfather    Colon cancer Neg Hx    Rectal cancer Neg Hx    Stomach cancer Neg Hx     Past Surgical History:  Procedure Laterality Date   ankle re construction  11/27/2009   bilateral   ANTERIOR CERVICAL DECOMP/DISCECTOMY FUSION N/A 02/28/2023   Procedure: Anterior Cervical Decompression/Discectomy Fusion, Interbody Prosthesis, Plate/Screws Cervical Three-Cervical Four, Cervical Four-Cervical Five, Cervical Five- Cervical Six;  Surgeon: Tressie Stalker, MD;  Location: [facility] OR;  Service: Neurosurgery;  Laterality: N/A;  3C   APPENDECTOMY  11/28/1979   exploratory surgery d/t appendix bursting   Social History   Occupational History   Occupation: Garment/textile technologist: [employer]  Tobacco Use   Smoking status: Never   Smokeless tobacco: Never  Vaping Use   Vaping status: Never Used  Substance and Sexual Activity   Alcohol use: Yes    Alcohol/week: 2.0 standard drinks of alcohol    Types: 2 Glasses of wine per week   Drug use: No   Sexual activity: Yes    Birth control/protection: Pill, None    Comment: 1 male

## 2023-10-24 ENCOUNTER — Encounter (HOSPITAL_BASED_OUTPATIENT_CLINIC_OR_DEPARTMENT_OTHER): Payer: BC Managed Care – PPO | Admitting: Nurse Practitioner

## 2023-11-14 ENCOUNTER — Telehealth: Payer: Self-pay | Admitting: Orthopaedic Surgery

## 2023-11-14 NOTE — Telephone Encounter (Signed)
Left message on patient's voicemail asking him to return my call.  Called to confirm surgery date will work for him.  Patient is scheduled with Dr. Roda Shutters for right knee partial medial meniscectomy at [facility] 11-23-23 at 12pm with arrival time of 10am.  We are scheduling outside of Dr Warren Danes block time on a Friday and time was requested to accommodate patient's schedule since he could not commit to Thursday 11-22-23.  We will need to confirm patient will accept this surgery date.

## 2023-11-15 ENCOUNTER — Other Ambulatory Visit: Payer: Self-pay | Admitting: Family Medicine

## 2023-11-15 DIAGNOSIS — F419 Anxiety disorder, unspecified: Secondary | ICD-10-CM

## 2023-11-15 DIAGNOSIS — M25551 Pain in right hip: Secondary | ICD-10-CM

## 2023-11-15 NOTE — Telephone Encounter (Signed)
Copied from CRM [id number]. Topic: Clinical - Medication Refill >> Nov 15, 2023  3:31 PM Lennart Pall wrote: Most Recent Primary Care Visit:  Provider: [name]  Department: [facility]  Visit Type: OFFICE VISIT  Date: 03/19/2023  Medication: traMADol  Has the patient contacted their pharmacy?  (Agent: If no, request that the patient contact the pharmacy for the refill. If patient does not wish to contact the pharmacy document the reason why and proceed with request.) (Agent: If yes, when and what did the pharmacy advise?)  Is this the correct pharmacy for this prescription?  If no, delete pharmacy and type the correct one.  This is the patient's preferred pharmacy:  [id number] - [place], [place] - [address] [place] [address] Phone: [phone] Fax: [phone]  [id number] - [place], [place] - [address] [place] [address] Phone: [phone] Fax: [phone]   Has the prescription been filled recently?   Is the patient out of the medication?   Has the patient been seen for an appointment in the last year OR does the patient have an upcoming appointment?   Can we respond through MyChart?   Agent: Please be advised that Rx refills may take up to 3 business days. We ask that you follow-up with your pharmacy.

## 2023-11-19 ENCOUNTER — Encounter: Payer: Self-pay | Admitting: Family Medicine

## 2023-11-19 MED ORDER — TRAMADOL HCL 50 MG PO TABS: 50.0000 mg | ORAL_TABLET | Freq: Three times a day (TID) | ORAL | 0 refills | Status: DC | PRN

## 2023-11-19 MED ORDER — DIAZEPAM 10 MG PO TABS: 10.0000 mg | ORAL_TABLET | Freq: Three times a day (TID) | ORAL | 0 refills | Status: DC

## 2023-11-19 NOTE — Telephone Encounter (Signed)
[facility]) called stating that the pt had requested the following medications to be refilled:  traMADol (ULTRAM) 50 MG tablet [[id number]]  diazepam (VALIUM) 10 MG tablet [[id number]]  She stated the pt would like to have the refill sent to the following pharmacy:  [id number] - [place], [place] - [address], [place] [address] Phone: [phone]  Fax: [phone]  After reviewing chart, noted original request was routed incorrectly. Routed [facility] due to internal error.

## 2023-11-19 NOTE — Addendum Note (Signed)
Addended by: Roxanne Gates on: 11/19/2023 02:31 PM   Modules accepted: Orders

## 2023-11-19 NOTE — Telephone Encounter (Signed)
Requesting: tramadol Contract: 03/19/23 UDS: 03/19/23 Last OV: 03/19/23 Next OV: n/a Last Refill: 10/22/23, #90--0 RF Database:   Requesting: Diazepam Contract: UDS: Last OV: Next OV: Last Refill: 10/22/23, #90--0 RF Database:   Please advise

## 2023-11-19 NOTE — Telephone Encounter (Signed)
error 

## 2023-11-30 ENCOUNTER — Other Ambulatory Visit: Payer: Self-pay | Admitting: Family Medicine

## 2023-11-30 DIAGNOSIS — G4700 Insomnia, unspecified: Secondary | ICD-10-CM

## 2023-11-30 NOTE — Telephone Encounter (Signed)
 Requesting: Ambien 10mg   Contract: 03/19/23 UDS: 03/19/23 Last Visit: 03/19/23 Next Visit: None Last Refill: 08/20/23 #90 and 0RF   Please Advise

## 2023-11-30 NOTE — Telephone Encounter (Signed)
 Copied from CRM [id number]. Topic: Clinical - Medication Refill >> Nov 30, 2023  1:59 PM Corin V wrote: Most Recent Primary Care Visit:  Provider: ANTONIO CYNDEE ROCKERS R  Department: [facility]  Visit Type: OFFICE VISIT  Date: 03/19/2023  Medication: zolpidem  (AMBIEN ) 10 MG tablet  Has the patient contacted their pharmacy?  (Agent: If no, request that the patient contact the pharmacy for the refill. If patient does not wish to contact the pharmacy document the reason why and proceed with request.) (Agent: If yes, when and what did the pharmacy advise?)  Is this the correct pharmacy for this prescription?  If no, delete pharmacy and type the correct one.  This is the patient's preferred pharmacy:  [id number] - [place], [place] - [address] [place] [address] Phone: [phone] Fax: [phone]  [id number] - [place], [place] - [address] [place] [address] Phone: [phone] Fax: [phone]   Has the prescription been filled recently?   Is the patient out of the medication?   Has the patient been seen for an appointment in the last year OR does the patient have an upcoming appointment?   Can we respond through MyChart?   Agent: Please be advised that Rx refills may take up to 3 business days. We ask that you follow-up with your pharmacy.

## 2023-12-01 MED ORDER — ZOLPIDEM TARTRATE 10 MG PO TABS: ORAL_TABLET | ORAL | 0 refills | Status: DC

## 2023-12-08 ENCOUNTER — Other Ambulatory Visit: Payer: Self-pay | Admitting: Family Medicine

## 2023-12-08 DIAGNOSIS — I10 Essential (primary) hypertension: Secondary | ICD-10-CM

## 2023-12-10 ENCOUNTER — Other Ambulatory Visit: Payer: Self-pay | Admitting: *Deleted

## 2023-12-10 ENCOUNTER — Telehealth: Payer: Self-pay | Admitting: Nurse Practitioner

## 2023-12-10 DIAGNOSIS — E785 Hyperlipidemia, unspecified: Secondary | ICD-10-CM

## 2023-12-10 DIAGNOSIS — I6522 Occlusion and stenosis of left carotid artery: Secondary | ICD-10-CM

## 2023-12-10 NOTE — Telephone Encounter (Signed)
 Okay to order NMR, CMP to be collected a few days prior to OV.   Alver Sorrow, NP

## 2023-12-10 NOTE — Telephone Encounter (Signed)
 Agree with plan

## 2023-12-10 NOTE — Telephone Encounter (Signed)
 Pt has an appt with [facility] but no labs are in. Pt would like to have them done before he comes in.

## 2023-12-10 NOTE — Telephone Encounter (Signed)
 S/w pt is aware can go to [facility] and get fasting labs prior to appt with lipid clinic on 1/15. Orders placed in system and released.

## 2023-12-12 DIAGNOSIS — N401 Enlarged prostate with lower urinary tract symptoms: Secondary | ICD-10-CM | POA: Diagnosis not present

## 2023-12-12 DIAGNOSIS — R351 Nocturia: Secondary | ICD-10-CM | POA: Diagnosis not present

## 2023-12-12 DIAGNOSIS — R3912 Poor urinary stream: Secondary | ICD-10-CM | POA: Diagnosis not present

## 2023-12-16 LAB — COMPREHENSIVE METABOLIC PANEL
ALT: 25 [IU]/L (ref 0–44)
AST: 15 [IU]/L (ref 0–40)
Albumin: 4.4 g/dL (ref 3.9–4.9)
Alkaline Phosphatase: 53 [IU]/L (ref 44–121)
BUN/Creatinine Ratio: 11 (ref 10–24)
BUN: 14 mg/dL (ref 8–27)
Bilirubin Total: 0.4 mg/dL (ref 0.0–1.2)
CO2: 22 mmol/L (ref 20–29)
Calcium: 9.5 mg/dL (ref 8.6–10.2)
Chloride: 105 mmol/L (ref 96–106)
Creatinine, Ser: 1.25 mg/dL (ref 0.76–1.27)
Globulin, Total: 2.3 g/dL (ref 1.5–4.5)
Glucose: 101 mg/dL — ABNORMAL HIGH (ref 70–99)
Potassium: 5.2 mmol/L (ref 3.5–5.2)
Sodium: 143 mmol/L (ref 134–144)
Total Protein: 6.7 g/dL (ref 6.0–8.5)
eGFR: 65 mL/min/{1.73_m2} (ref >59)

## 2023-12-16 LAB — NMR, LIPOPROFILE
Cholesterol, Total: 139 mg/dL (ref 100–199)
HDL Particle Number: 36.2 umol/L (ref >=30.5)
HDL-C: 45 mg/dL (ref >39)
LDL Particle Number: 773 nmol/L (ref <1000)
LDL Size: 20.1 nm — ABNORMAL LOW (ref >20.5)
LDL-C (NIH Calc): 67 mg/dL (ref 0–99)
LP-IR Score: 79 — ABNORMAL HIGH (ref <=45)
Small LDL Particle Number: 492 nmol/L (ref <=527)
Triglycerides: 159 mg/dL — ABNORMAL HIGH (ref 0–149)

## 2023-12-17 ENCOUNTER — Encounter (HOSPITAL_BASED_OUTPATIENT_CLINIC_OR_DEPARTMENT_OTHER): Payer: Self-pay

## 2023-12-17 NOTE — Progress Notes (Unsigned)
Cardiology Office Note:  .   Date:  12/19/2023  ID:  Jason Williams, DOB [birth year], MRN [id number] PCP: Zola Button, Grayling Congress, DO  [facility] Providers Cardiologist:  Chrystie Nose, MD    Patient Profile: .      PMH Coronary artery disease Coronary CTA 08/23/2021 CAC score 387 (88th percentile) Calcified plaque in prox and mid LAD mild stenosis (25-49%) Calcified plaque in prox OM1 (0-24%) stenosis Calcified plaque in OM2 (small vessel, mild 25-49% stenosis) Calcified plaque in prox portion of small nondominant RCA causes mild (25-49%) stenosis ETT 02/21/2023 No evidence of ischemia Hyperlipidemia Statin myalgia Elevated lipoprotein a Carotid artery disease Mild stenosis LICA 1-39% Hypertension GERD Sleep apnea Anxiety Depression Family history early CAD Father died from MI in his 28s  Referred to [facility] Lipid Disorders clinic and seen by Dr. Rennis Golden 07/27/2021.  He reported longstanding history of elevated cholesterol but unfortunately has been intolerant to statins causing primarily myalgias.  His lipid panel at that time revealed total cholesterol 250, triglycerides 230, HDL 41, and LDL 166.  He had been seen remotely by heart care in 2015 and 2016 for chest pain but no clear cardiac cause was noted.  Echo in 2016 showed normal systolic function with mild left atrial enlargement.  He also underwent Myoview stress testing which showed no reversible perfusion defects.  He reports significant early onset heart disease in his family including a father who died of a heart attack in his 15s.  He reported chest discomfort that feels like a heaviness sometimes associated with exertion and relieved by rest but also occurring at rest as well.  He had recent EKG which showed no ischemic changes.  He was started on PCSK9i therapy with marked improvement in LDL-P from 2204 to 428, LDL-C 44 down from 167, and triglycerides 150 from 254.  Coronary CTA demonstrated calcium score 387  (88th percentile) but only mild to moderate nonobstructive coronary disease.  Last clinic visit was 02/20/2023 with Jason Searing, NP for preoperative surgical evaluation for cervical spine procedure.  He reported he continued to have chest discomfort.  ETT was obtained for ischemia evaluation and did not show any concern for ischemia.  Left carotid bruit was auscultated and carotid duplex was completed which showed only mild stenosis 1 to 39% LICA.       History of Present Illness: Jason Williams   ERDI Jason Williams is a very pleasant 63 y.o. male who is here today for follow-up of CAD and hyperlipidemia. Reports he is feeling well. We discussed his lipid results in detail. He is concerned about glucose levels, and weight gain. He reports a significant lifestyle change due to his neck fusion, which has limited his physical activities. Despite being on Repatha for cholesterol management, he expresses concern about his increasing glucose levels and weight gain. He has a family history of heart conditions, which adds to his anxiety about his health. He reports occasional palpitations, most noticeable at night. He does not have palpitations with exertion. He expresses a desire to improve his diet and increase his physical activity, but he is unsure about the extent to which these changes will impact his health. He also expresses concern about inflammation in his body, which he believes may be diet-related.  He denies chest pain, shortness of breath, orthopnea, PND, edema, presyncope, syncope.   Discussed the use of AI scribe software for clinical note transcription with the patient, who gave verbal consent to proceed.   ROS: See HPI  Studies Reviewed: Jason Williams       Coronary CTA 08/23/2021 IMPRESSION: 1. Coronary calcium score of 387. This was 88th percentile for age and sex matched control.   2.  Nonobstructive CAD   3. Calcified plaque in the proximal and mid LAD causes mild (25-49%) stenosis   4. Calcified  plaque in the proximal OM1 causes minimal (0-24%) stenosis. Calcified plaque in OM2 (small vessel, <44mm) causes mild (25-49%) stenosis.   5. Calcified plaque in proximal portion of small, nondominant RCA causes mild (25-49%) stenosis   CAD-RADS 2. Mild non-obstructive CAD (25-49%). Consider non-atherosclerotic causes of chest pain. Consider preventive therapy and risk factor modification.  Echo 08/05/2015 Left ventricle: The cavity size was normal. Systolic function was    normal. The estimated ejection fraction was 55%. Wall motion was    normal; there were no regional wall motion abnormalities.  - Aortic valve: There was trivial regurgitation.  - Left atrium: The atrium was mildly dilated.  - Atrial septum: No defect or patent foramen ovale was identified.   Risk Assessment/Calculations:             Physical Exam:   VS:  BP 120/70 (BP Location: Left Arm, Patient Position: Sitting, Cuff Size: Normal)   Pulse 75   Ht 5\' 10"  (1.778 m)   Wt 212 lb (96.2 kg)   BMI 30.42 kg/m    Wt Readings from Last 3 Encounters:  12/19/23 212 lb (96.2 kg)  03/19/23 194 lb (88 kg)  02/28/23 200 lb (90.7 kg)    GEN: Well nourished, well developed in no acute distress NECK: No JVD; No carotid bruits CARDIAC: RRR, soft systolic murmurs, rubs, gallops RESPIRATORY:  Clear to auscultation without rales, wheezing or rhonchi  ABDOMEN: Soft, non-tender, non-distended EXTREMITIES:  No edema; No deformity     ASSESSMENT AND PLAN: .    Dyslipidemia: Lipid panel completed 12/14/2023 LDL-P 773, LDL-C 67, HDL-C 45, triglycerides 159, small particle number 492, and total cholesterol 139.  We discussed mildly elevated triglycerides.  He is also concerned about elevated insulin resistance.  Lengthy discussion about healthy diet and exercise.  He admits he has room to improve.  Insurance recently changed him from [employer] to [employer].  He is not having any problems with the medication. Continue Repatha.  CAD:  History of elevated coronary calcium score of 387 (88th percentile) on CT 07/2021. We reviewed this report in detail. He denies chest pain, dyspnea, or other symptoms concerning for angina.  No indication for further ischemic evaluation at this time.  Continue amlodipine, Repatha, lisinopril. Focus on secondary prevention including heart healthy mostly plant based diet avoiding saturated fat, processed foods, simple carbohydrates, and sugar along with aiming for at least 150 minutes of moderate intensity exercise each week.   Impaired glucose tolerance: Mildly elevated glucose and elevated insulin resistance on recent labs.  He does not recall having hemoglobin A1c testing in the past.  Advised him to return fasting within the next few days for A1c.  Hypertension: BP is well controlled. Renal function stable on labs completed 12/14/2023.  No medication changes today.  Management per PCP.  Carotid artery disease: Carotid duplex 02/21/23 with 1-39% stenosis LICA, minimal plaque on RICA.  He is asymptomatic.  We will continue to monitor clinically at this time.        Dispo: 6 months with Dr. Rennis Golden or me  Signed, Eligha Bridegroom, NP-C

## 2023-12-18 ENCOUNTER — Other Ambulatory Visit: Payer: Self-pay | Admitting: Physician Assistant

## 2023-12-18 MED ORDER — ONDANSETRON HCL 4 MG PO TABS: 4.0000 mg | ORAL_TABLET | Freq: Three times a day (TID) | ORAL | 0 refills | Status: AC | PRN

## 2023-12-18 MED ORDER — HYDROCODONE-ACETAMINOPHEN 5-325 MG PO TABS: 1.0000 | ORAL_TABLET | Freq: Three times a day (TID) | ORAL | 0 refills | Status: DC | PRN

## 2023-12-19 ENCOUNTER — Encounter (HOSPITAL_BASED_OUTPATIENT_CLINIC_OR_DEPARTMENT_OTHER): Payer: Self-pay | Admitting: Nurse Practitioner

## 2023-12-19 ENCOUNTER — Ambulatory Visit (HOSPITAL_BASED_OUTPATIENT_CLINIC_OR_DEPARTMENT_OTHER): Payer: BC Managed Care – PPO | Admitting: Nurse Practitioner

## 2023-12-19 ENCOUNTER — Other Ambulatory Visit (HOSPITAL_BASED_OUTPATIENT_CLINIC_OR_DEPARTMENT_OTHER): Payer: Self-pay | Admitting: *Deleted

## 2023-12-19 VITALS — BP 120/70 | HR 75 | Ht 70.0 in | Wt 212.0 lb

## 2023-12-19 DIAGNOSIS — R7302 Impaired glucose tolerance (oral): Secondary | ICD-10-CM

## 2023-12-19 DIAGNOSIS — L570 Actinic keratosis: Secondary | ICD-10-CM | POA: Diagnosis not present

## 2023-12-19 DIAGNOSIS — E785 Hyperlipidemia, unspecified: Secondary | ICD-10-CM

## 2023-12-19 DIAGNOSIS — I10 Essential (primary) hypertension: Secondary | ICD-10-CM

## 2023-12-19 DIAGNOSIS — R0989 Other specified symptoms and signs involving the circulatory and respiratory systems: Secondary | ICD-10-CM

## 2023-12-19 DIAGNOSIS — I2510 Atherosclerotic heart disease of native coronary artery without angina pectoris: Secondary | ICD-10-CM

## 2023-12-19 DIAGNOSIS — L821 Other seborrheic keratosis: Secondary | ICD-10-CM | POA: Diagnosis not present

## 2023-12-19 DIAGNOSIS — D1801 Hemangioma of skin and subcutaneous tissue: Secondary | ICD-10-CM | POA: Diagnosis not present

## 2023-12-19 DIAGNOSIS — L814 Other melanin hyperpigmentation: Secondary | ICD-10-CM | POA: Diagnosis not present

## 2023-12-19 NOTE — Patient Instructions (Signed)
Medication Instructions:   Your physician recommends that you continue on your current medications as directed. Please refer to the Current Medication list given to you today.   *If you need a refill on your cardiac medications before your next appointment, please call your pharmacy*   Lab Work:  Your physician recommends that you return for a FASTING A1C. Fasting after midnight.     If you have labs (blood work) drawn today and your tests are completely normal, you will receive your results only by: MyChart Message (if you have MyChart) OR A paper copy in the mail If you have any lab test that is abnormal or we need to change your treatment, we will call you to review the results.   Testing/Procedures:  None ordered.   Follow-Up: At [facility], you and your health needs are our priority.  As part of our continuing mission to provide you with exceptional heart care, we have created designated Provider Care Teams.  These Care Teams include your primary Cardiologist (physician) and Advanced Practice Providers (APPs -  Physician Assistants and Nurse Practitioners) who all work together to provide you with the care you need, when you need it.  We recommend signing up for the patient portal called "MyChart".  Sign up information is provided on this After Visit Summary.  MyChart is used to connect with patients for Virtual Visits (Telemedicine).  Patients are able to view lab/test results, encounter notes, upcoming appointments, etc.  Non-urgent messages can be sent to your provider as well.   To learn more about what you can do with MyChart, go to [email or web address].    Your next appointment:   6 month(s)  Provider:   K. Italy Hilty, MD or Eligha Bridegroom, NP    Your physician wants you to follow-up in: 6 months.  You will receive a reminder letter in the mail two months in advance. If you don't receive a letter, please call our office to schedule the follow-up  appointment.  Other Instructions  Tackling Obesity with Lifestyle Changes  Obesity- What is it? And What can we do about it?  Obesity is a chronic complex disease defined as excessive fat deposits that can have a negative effect on our health. It can lead to many other diseases including type 2 diabetes.  Weight gain occurs when the amount of energy (calories) we consume is greater than the amount we use.  When our energy output is greater than our energy input we lose weight. The basic concept is simple, but in reality, it's much more complicated.  Unfortunately, in some people, our bodies have many ways it can compensate when we try to eat less and move more which can prevent [place] from changing our weight. This can lead to some people having a much more difficult time losing weight even when they put healthy habits into practice. This can be frustrating. We want to focus on healthy habits, physical activity and how we feel, and less the number on the scale.  Food As Energy  Calories  Calories is just a unit of measurement for energy.  Counting calories is not required to lose weight but counting for a short period of time can:   help you learn good portion sizes   Learn what your true energy needs are.   Help you be more aware of your snacking or grazing habits  To help calculate how many calories you should be eating, the NIH has a great body weight planner calculator  at BeverageBuggy.si  Types of Energy Expenditure  Basal Metabolic Rate (BMR) Energy that our bodies use to preform everyday tasks. More muscle mass through resistance training can increase this a small amount  Thermic Effect of Food The amount of energy that it takes to breakdown the food we eat. This will be highest when we eat protein and fiber rich foods  Exercise Energy Expenditure The amount of energy used during formal exercise (walking, biking, weightlifting)  Non-exercise activity thermogenesis  (NEAT) The amount of energy spent on activities that are not formal exercise (standing, fidgeting). Therefore, it is not only important to do formal exercise but also move around throughout the day.  Managing The Meal  Macro nutrients (carbohydrates, fats and protein, fiber, water)  Micronutrients (vitamins, minerals)  Dietary Fiber  Benefits Examples Cautions  Soluble fiber  Decreases cholesterol  improve blood sugar control,  Feeds our gut bacteria  Allows [place] to feel fuller for longer so we eat less  fruits  oats  barley  legumes  peas  Beans  vegetables (broccoli) and root vegetables (carrots) Add fiber into your diet slowly and be sure to drink at least 8 cups of water a day. This will help limit gas, bloating, diarrhea, or constipation.  Insoluble Fiber  Improves digestive health by making stool easier to pass  Allows [place] to feel fuller for longer so we eat less  whole grains  nuts  seeds  skin of fruit  vegetables (green beans, zucchini, cauliflower)  Tricks to add more fiber to your diet   Add beans (pinto, kidney, lima, navy and garbanzo) to salads, ground meat or brown rice   Add nuts or seeds and or fresh/frozen fruit to yogurt, cottage cheese, salads or steel cut oats   Cut up vegetables and eat with hummus   Look for unsweetened whole grain cereals with at least 5g of fiber per serving   Switch to whole grain bread. Look for bread that has whole grain flour as the first ingredient and has more fiber than carbs if you were to multiple the fiber x 10.   Try bulgar, barely, quinoa, buckwheat, brown rice wild rice instead of white rice   Keep frozen vegetables on hand to add to dishes or soups  Meal Planning:  Meal planning is the key to setting you up for success. Here are some examples of healthy meal options.  Breakfast  Option 1: Omelette with vegetables (1 egg, spinach, mushrooms, or other vegetable of your choice), 2 slices whole-grain  toast, tip of thumb size butter or soft margarine,  cup low-fat milk or yogurt  Option 2: steel-cut rolled oats (? cup dry), 1 tbsp peanut butter added to cooked oats,  cup low-fat milk.  Option 3: 2 slices whole-grain or rye toast with avocado spread ( small avocado mased with herbs and pepper to taste), 1 poached egg or [place] up (cooked to your liking)  Option 4:  cup plain 0% [place] yogurt topped with  cup berries and  cup walnuts or almonds, 2 slices whole-grain or rye toast, tip of thumb size soft margarine/butter  Lunch:  Option 1: 2 cups red lentil soup, green salad with 1 tbsp homemade vinaigrette (extra virgin olive oil and vinegar of choice plus spices)  Option 2: 3 oz. roasted chicken, 2 slices whole-grain bread, 2 tsp mayonnaise, mustard, lettuce, tomato if desired, 1 fruit (example: medium-sized apple or small pear)  Option 3: 3 oz. tuna packed in water, 1 whole-wheat pita (6  inch), 2 tsp mayonnaise, lettuce, tomato, or other non-starchy vegetable of your choice, 1 fruit (example: medium-sized apple or small pear)  Option 4: 1 serving of garden veggie buddha bowl with lentils and tahini sauce and 1 cup berries topped with  cup plain 0% Greek yogurt  Dinner:  Option 1: 1 serving roasted cauliflower salad, 3-4 oz. grilled or baked pork loin chop, 1/2 cup mashed potato, or brown rice or quinoa  Option 2: 1 serving fish (baked, grilled or air fried), green salad, 1 tbsp homemade vinaigrette,  cup cooked couscous  Option 3: 1 cup cooked whole grained pasta (example: spaghetti, spirals, macaroni),  cup favorite pasta sauce (preferably homemade), 3-4 oz. grilled or baked chicken, green salad, 1 tbsp homemade vinaigrette  Option 4: 1 serving oven roasted salmon,  cup mashed sweet potato or couscous or brown rice or quinoa, broccoli (steamed or roasted)  Healthy snacks:   Carrots or celery with 1 tbsp of hummus   1 medium-sized fruit (apple or orange)   1 cup  plain 0% [place] yogurt with  cup berries   Half apple, sliced, with 1 tbsp (15 mL) peanut or almond butter  Dining out:  Eating away from home has become a part of many people's lifestyle. Making healthy choices when you are eating out is important too. Portion size is an important part of healthy choices. Most branded fast-food places provide calories, sodium, and fat content for their menu items. www.calorieking.com would be great resource to find nutrition facts for your favorite brands and fast-food restaurants. Company specific website can be Chief Technology Officer for nutrition information for their items. (e.g. www.mcdonalds.com or www.nutritionix.com/[place]/menu/premium)  Here are some tips to help you make wise food choices when you are dining out.  Chose more often Avoid  Beverages   Choose more often: Water, low fat milk  Sugar-free/diet drinks  Unsweet tea or coffee    Avoid: Milkshakes, fruit drinks, regular pop  Alcohol, specialty drinks (e.g. iced cappuccino)  Fast food  Choose more often:  Garden salad  Mini subs, pita sandwiches ect with extra vegetables  plain burgers, grilled chicken  Vegetarian or cheese pizza with whole-grain crust    Avoid: Burgers/sandwiches with bacon, cheese, and high-fat sauces  [place] fries, fried chicken, fried fish, poutine, hash browns  Pizza with processed meats  Starters   Choose more often: Raw vegetables, salads (garden, spinach, fruit)  clear or vegetable soups  Seafood cocktail  Whole-grain breads and rolls    Avoid: Salads with high-fat dressings or toppings  Creamy soups  Wings, egg rolls  onion rings, nachos  White or garlic bread  Main courses Grains & Starches (amount equal to  of your plate)  Choose more often:  Oatmeal, high-fiber/lower-sugar cereals  Whole-grain breads, rice, pasta, barley, couscous  Sweet potatoes    Avoid: Sugary, low-fiber cereals  Large bagels, muffins, croissants, white bread   [place] fries, hash browns, fried rice   Meat and alternative (amount equal to  of your plate)  Choose more often:  Lean meats, poultry, fish, eggs, low-fat cheese  Tofu, vegetable protein Legumes (e.g. lentils, chickpeas, beans)    Avoid: High-salt and/or high-fat meats (e.g. ribs, wings, sausages, wieners, processed lunch meats, imposter meats)   Vegetables (amount equal to  of your plate)  Choose more often:  Salads ([place], garden, spinach), plain vegetables   Avoid:  Salads with creamy, high-fat dressings and   Vegetables on sandwiches ect toppings like bacon bits, croutons, cheese  Desserts  Choose more often:  Fresh fruit, frozen yogourt, skim milk latte    Avoid: Cakes, pies, pastries, ice cream, cheesecake

## 2023-12-27 ENCOUNTER — Encounter: Payer: Self-pay | Admitting: Orthopaedic Surgery

## 2023-12-27 DIAGNOSIS — S83231A Complex tear of medial meniscus, current injury, right knee, initial encounter: Secondary | ICD-10-CM | POA: Diagnosis not present

## 2023-12-27 DIAGNOSIS — X58XXXA Exposure to other specified factors, initial encounter: Secondary | ICD-10-CM | POA: Diagnosis not present

## 2023-12-27 DIAGNOSIS — M94261 Chondromalacia, right knee: Secondary | ICD-10-CM | POA: Diagnosis not present

## 2023-12-27 DIAGNOSIS — S83241A Other tear of medial meniscus, current injury, right knee, initial encounter: Secondary | ICD-10-CM | POA: Diagnosis not present

## 2023-12-27 DIAGNOSIS — Y999 Unspecified external cause status: Secondary | ICD-10-CM | POA: Diagnosis not present

## 2023-12-27 DIAGNOSIS — M2241 Chondromalacia patellae, right knee: Secondary | ICD-10-CM | POA: Diagnosis not present

## 2023-12-31 ENCOUNTER — Other Ambulatory Visit: Payer: Self-pay | Admitting: Family Medicine

## 2023-12-31 DIAGNOSIS — M25552 Pain in left hip: Secondary | ICD-10-CM

## 2023-12-31 DIAGNOSIS — F419 Anxiety disorder, unspecified: Secondary | ICD-10-CM

## 2023-12-31 NOTE — Telephone Encounter (Signed)
Copied from CRM [id number]. Topic: Clinical - Medication Refill >> Dec 31, 2023 10:44 AM Prudencio Pair wrote: Most Recent Primary Care Visit:  Provider: [name]  Department: [facility]  Visit Type: OFFICE VISIT  Date: 03/19/2023  Medication: diazepam (VALIUM) 10 MG tablet & traMADol (ULTRAM) 50 MG tablet  Has the patient contacted their pharmacy? No (Agent: If no, request that the patient contact the pharmacy for the refill. If patient does not wish to contact the pharmacy document the reason why and proceed with request.) (Agent: If yes, when and what did the pharmacy advise?)  Is this the correct pharmacy for this prescription? Yes If no, delete pharmacy and type the correct one.  This is the patient's preferred pharmacy:  [id number] - [place], [place] - [address] [place] [address] Phone: [phone] Fax: [phone]  [id number] - [place], [place] - [address] [place] [address] Phone: [phone] Fax: [phone]   Has the prescription been filled recently? Yes  Is the patient out of the medication? Yes  Has the patient been seen for an appointment in the last year OR does the patient have an upcoming appointment? Yes  Can we respond through MyChart? Yes  Agent: Please be advised that Rx refills may take up to 3 business days. We ask that you follow-up with your pharmacy.

## 2023-12-31 NOTE — Telephone Encounter (Signed)
Requesting: diazepam and tramadol  Contract: 03/19/23 UDS: 03/19/23 Last Visit: 03/19/23 Next Visit: 01/01/24 Last Refill: see med list   Please Advise

## 2024-01-01 ENCOUNTER — Ambulatory Visit (INDEPENDENT_AMBULATORY_CARE_PROVIDER_SITE_OTHER): Payer: BC Managed Care – PPO | Admitting: Family Medicine

## 2024-01-01 ENCOUNTER — Encounter: Payer: Self-pay | Admitting: Family Medicine

## 2024-01-01 VITALS — BP 114/76 | HR 90 | Temp 97.7°F | Resp 18 | Ht 70.0 in | Wt 211.2 lb

## 2024-01-01 DIAGNOSIS — G4700 Insomnia, unspecified: Secondary | ICD-10-CM

## 2024-01-01 DIAGNOSIS — Z0000 Encounter for general adult medical examination without abnormal findings: Secondary | ICD-10-CM

## 2024-01-01 DIAGNOSIS — G8929 Other chronic pain: Secondary | ICD-10-CM

## 2024-01-01 DIAGNOSIS — F419 Anxiety disorder, unspecified: Secondary | ICD-10-CM

## 2024-01-01 DIAGNOSIS — E66811 Obesity, class 1: Secondary | ICD-10-CM

## 2024-01-01 DIAGNOSIS — I10 Essential (primary) hypertension: Secondary | ICD-10-CM | POA: Diagnosis not present

## 2024-01-01 DIAGNOSIS — M25552 Pain in left hip: Secondary | ICD-10-CM

## 2024-01-01 DIAGNOSIS — R739 Hyperglycemia, unspecified: Secondary | ICD-10-CM

## 2024-01-01 DIAGNOSIS — E785 Hyperlipidemia, unspecified: Secondary | ICD-10-CM | POA: Diagnosis not present

## 2024-01-01 DIAGNOSIS — M25551 Pain in right hip: Secondary | ICD-10-CM | POA: Diagnosis not present

## 2024-01-01 DIAGNOSIS — M5441 Lumbago with sciatica, right side: Secondary | ICD-10-CM

## 2024-01-01 LAB — CBC WITH DIFFERENTIAL/PLATELET
Basophils Absolute: 0 10*3/uL (ref 0.0–0.1)
Basophils Relative: 0.3 % (ref 0.0–3.0)
Eosinophils Absolute: 0.2 10*3/uL (ref 0.0–0.7)
Eosinophils Relative: 3 % (ref 0.0–5.0)
HCT: 47 % (ref 39.0–52.0)
Hemoglobin: 15.6 g/dL (ref 13.0–17.0)
Lymphocytes Relative: 22.1 % (ref 12.0–46.0)
Lymphs Abs: 1.3 10*3/uL (ref 0.7–4.0)
MCHC: 33.3 g/dL (ref 30.0–36.0)
MCV: 94.1 fl (ref 78.0–100.0)
Monocytes Absolute: 0.5 10*3/uL (ref 0.1–1.0)
Monocytes Relative: 9.3 % (ref 3.0–12.0)
Neutro Abs: 3.7 10*3/uL (ref 1.4–7.7)
Neutrophils Relative %: 65.3 % (ref 43.0–77.0)
Platelets: 244 10*3/uL (ref 150.0–400.0)
RBC: 4.99 Mil/uL (ref 4.22–5.81)
RDW: 13.6 % (ref 11.5–15.5)
WBC: 5.7 10*3/uL (ref 4.0–10.5)

## 2024-01-01 LAB — COMPREHENSIVE METABOLIC PANEL
ALT: 28 U/L (ref 0–53)
AST: 18 U/L (ref 0–37)
Albumin: 4.7 g/dL (ref 3.5–5.2)
Alkaline Phosphatase: 53 U/L (ref 39–117)
BUN: 16 mg/dL (ref 6–23)
CO2: 27 meq/L (ref 19–32)
Calcium: 9.7 mg/dL (ref 8.4–10.5)
Chloride: 102 meq/L (ref 96–112)
Creatinine, Ser: 1.11 mg/dL (ref 0.40–1.50)
GFR: 71.13 mL/min (ref >60.00)
Glucose, Bld: 95 mg/dL (ref 70–99)
Potassium: 4.7 meq/L (ref 3.5–5.1)
Sodium: 138 meq/L (ref 135–145)
Total Bilirubin: 0.8 mg/dL (ref 0.2–1.2)
Total Protein: 7.4 g/dL (ref 6.0–8.3)

## 2024-01-01 LAB — LIPID PANEL
Cholesterol: 114 mg/dL (ref 0–200)
HDL: 44.3 mg/dL
LDL Cholesterol: 35 mg/dL (ref 0–99)
NonHDL: 69.42
Total CHOL/HDL Ratio: 3
Triglycerides: 172 mg/dL — ABNORMAL HIGH (ref 0.0–149.0)
VLDL: 34.4 mg/dL (ref 0.0–40.0)

## 2024-01-01 LAB — PSA: PSA: 4.87 ng/mL — ABNORMAL HIGH (ref 0.10–4.00)

## 2024-01-01 LAB — TSH: TSH: 2.42 u[IU]/mL (ref 0.35–5.50)

## 2024-01-01 LAB — HEMOGLOBIN A1C: Hgb A1c MFr Bld: 5.7 % (ref 4.6–6.5)

## 2024-01-01 MED ORDER — SEMAGLUTIDE-WEIGHT MANAGEMENT 2.4 MG/0.75ML ~~LOC~~ SOAJ: 2.4000 mg | SUBCUTANEOUS | 0 refills | Status: DC

## 2024-01-01 MED ORDER — SEMAGLUTIDE-WEIGHT MANAGEMENT 1 MG/0.5ML ~~LOC~~ SOAJ: 1.0000 mg | SUBCUTANEOUS | 0 refills | Status: DC

## 2024-01-01 MED ORDER — SEMAGLUTIDE-WEIGHT MANAGEMENT 1.7 MG/0.75ML ~~LOC~~ SOAJ: 1.7000 mg | SUBCUTANEOUS | 0 refills | Status: DC

## 2024-01-01 MED ORDER — DIAZEPAM 10 MG PO TABS: 10.0000 mg | ORAL_TABLET | Freq: Three times a day (TID) | ORAL | 0 refills | Status: DC

## 2024-01-01 MED ORDER — SEMAGLUTIDE-WEIGHT MANAGEMENT 0.5 MG/0.5ML ~~LOC~~ SOAJ: 0.5000 mg | SUBCUTANEOUS | 0 refills | Status: AC

## 2024-01-01 MED ORDER — SEMAGLUTIDE-WEIGHT MANAGEMENT 0.25 MG/0.5ML ~~LOC~~ SOAJ: 0.2500 mg | SUBCUTANEOUS | 0 refills | Status: AC

## 2024-01-01 MED ORDER — TRAMADOL HCL 50 MG PO TABS: 50.0000 mg | ORAL_TABLET | Freq: Three times a day (TID) | ORAL | 0 refills | Status: DC | PRN

## 2024-01-01 NOTE — Progress Notes (Signed)
 Established Patient Office Visit  Subjective   Patient ID: FESTUS PURSEL, male    DOB: 1961-09-06  Age: 63 y.o. MRN: [id number]  Chief Complaint  Patient presents with   Annual Exam    Pt states fasting     HPI Discussed the use of AI scribe software for clinical note transcription with the patient, who gave verbal consent to proceed.  History of Present Illness   LIANDRO THELIN is a 63 year old male who presents for follow-up and weight management.  He underwent arthroscopic knee surgery last Thursday to address torn cartilage and bone damage. He experiences soreness in the knee but is recovering well. The procedure involved scraping the bone to regenerate blood flow. He has not yet started physical therapy but anticipates receiving exercises during his follow-up visit. Prior to surgery, he received injections for knee pain.  He has a history of neck issues, having undergone surgery for cervical stenosis last April. He has not followed up since then but mentions ongoing lower back pain attributed to two degenerative discs and a herniated disc. He has not received any treatment for the lower back pain yet. He experiences sciatica and describes significant pain in the lower back, which has not been addressed due to the focus on his neck issues.  He is concerned about his weight, noting an increase from 185 to 215 pounds over the past few years. He has attempted dietary changes, including reducing red meat and increasing fruits and vegetables, but has not been able to lose weight. His recent lipid panel showed a glucose level of 101, slightly above normal. He is interested in weight loss medication to aid in losing weight.  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 He experiences pain below the rib cage, which he attributes to lesions from previous surgeries. The pain is not food-related and is exacerbated by  physical activity.      Patient Active Problem List   Diagnosis Date Noted   Bilateral hip pain 03/19/2023   Cervical spondylosis with myelopathy and radiculopathy 02/28/2023   Suspicious nevus 11/09/2022   Chronic pain syndrome 11/09/2022   Excessive urination at night 04/28/2022   Statin intolerance 07/12/2021   Primary osteoarthritis of right knee 07/12/2021   Acute pain of right knee 12/22/2020   Preventative health care 09/15/2019   Screening for HIV without presence of risk factors 09/15/2019   Encounter for hepatitis C screening test for low risk patient 09/15/2019   BPH (benign prostatic hyperplasia) 03/31/2016   GERD (gastroesophageal reflux disease) 03/18/2015   ED 10/07/2014   Carpal tunnel syndrome 12/28/2011   Primary hypertension 01/24/2011   SNORING 01/02/2011   Hyperlipidemia 11/01/2010   Anxiety state 11/01/2010   PLANTAR FASCIITIS, BILATERAL 08/15/2010   PAIN IN JOINT, ANKLE AND FOOT 03/10/2010   Episodic mood disorder (HCC) 01/06/2010   Insomnia 01/24/2007   Past Medical History:  Diagnosis Date   Anxiety    Arthritis    Depression    GERD (gastroesophageal reflux disease)    Headache    Heart murmur    as child   History of MRSA infection    toe on right foot   Hyperlipidemia    Hypertension    Sleep apnea    no CPAP   Past Surgical History:  Procedure Laterality Date   ankle re construction  11/27/2009   bilateral   ANTERIOR CERVICAL DECOMP/DISCECTOMY FUSION N/A  02/28/2023   Procedure: Anterior Cervical Decompression/Discectomy Fusion, Interbody Prosthesis, Plate/Screws Cervical Three-Cervical Four, Cervical Four-Cervical Five, Cervical Five- Cervical Six;  Surgeon: Mavis Purchase, MD;  Location: [facility] OR;  Service: Neurosurgery;  Laterality: N/A;  3C   APPENDECTOMY  11/28/1979   exploratory surgery d/t appendix bursting   Social History   Tobacco Use   Smoking status: Never   Smokeless tobacco: Never  Vaping Use   Vaping status: Never  Used  Substance Use Topics   Alcohol use: Yes    Alcohol/week: 2.0 standard drinks of alcohol    Types: 2 Glasses of wine per week   Drug use: No   Social History   Socioeconomic History   Marital status: Single    Spouse name: Not on file   Number of children: 1   Years of education: Not on file   Highest education level: Associate degree: academic program  Occupational History   Occupation: Garment/textile Technologist: [employer]  Tobacco Use   Smoking status: Never   Smokeless tobacco: Never  Vaping Use   Vaping status: Never Used  Substance and Sexual Activity   Alcohol use: Yes    Alcohol/week: 2.0 standard drinks of alcohol    Types: 2 Glasses of wine per week   Drug use: No   Sexual activity: Yes    Birth control/protection: Pill, None    Comment: 1 male  Other Topics Concern   Not on file  Social History Narrative   Not on file   Social Drivers of Health   Financial Resource Strain: Low Risk  (12/30/2023)   Overall Financial Resource Strain (CARDIA)    Difficulty of Paying Living Expenses: Not hard at all  Food Insecurity: No Food Insecurity (12/30/2023)   Hunger Vital Sign    Worried About Running Out of Food in the Last Year: Never true    Ran Out of Food in the Last Year: Never true  Transportation Needs: No Transportation Needs (12/30/2023)   PRAPARE - Administrator, Civil Service (Medical): No    Lack of Transportation (Non-Medical): No  Physical Activity: Sufficiently Active (12/30/2023)   Exercise Vital Sign    Days of Exercise per Week: 4 days    Minutes of Exercise per Session: 60 min  Stress: Stress Concern Present (12/30/2023)   [employer] of Occupational Health - Occupational Stress Questionnaire    Feeling of Stress : To some extent  Social Connections: Unknown (12/30/2023)   Social Connection and Isolation Panel [NHANES]    Frequency of Communication with Friends and Family: Three times a week    Frequency of Social Gatherings  with Friends and Family: Twice a week    Attends Religious Services: Patient declined    Database Administrator or Organizations: Yes    Attends Engineer, Structural: 1 to 4 times per year    Marital Status: Divorced  Catering Manager Violence: Not on file   Family Status  Relation Name Status   Mother  Deceased   Father  Deceased   Sister  Alive   Sister  Alive   MGF  Deceased   Neg Hx  (Not Specified)  No partnership data on file   Family History  Problem Relation Age of Onset   Esophageal cancer Mother    Heart attack Father        Died of MI at 53   Mental illness Father    Healthy Sister    Healthy  Sister    Heart disease Maternal Grandfather        MI/CABG   Heart attack Maternal Grandfather    Colon cancer Neg Hx    Rectal cancer Neg Hx    Stomach cancer Neg Hx    No Known Allergies    Review of Systems  Constitutional:  Negative for chills, fever and malaise/fatigue.  HENT:  Negative for congestion and hearing loss.   Eyes:  Negative for blurred vision and discharge.  Respiratory:  Negative for cough, sputum production and shortness of breath.   Cardiovascular:  Negative for chest pain, palpitations and leg swelling.  Gastrointestinal:  Negative for abdominal pain, blood in stool, constipation, diarrhea, heartburn, nausea and vomiting.  Genitourinary:  Negative for dysuria, frequency, hematuria and urgency.  Musculoskeletal:  Negative for back pain, falls and myalgias.  Skin:  Negative for rash.  Neurological:  Negative for dizziness, sensory change, loss of consciousness, weakness and headaches.  Endo/Heme/Allergies:  Negative for environmental allergies. Does not bruise/bleed easily.  Psychiatric/Behavioral:  Negative for depression and suicidal ideas. The patient is not nervous/anxious and does not have insomnia.       Objective:     BP 114/76 (BP Location: Left Arm, Patient Position: Sitting, Cuff Size: Large)   Pulse 90   Temp 97.7 F  (36.5 C) (Oral)   Resp 18   Ht 5' 10 (1.778 m)   Wt 211 lb 3.2 oz (95.8 kg)   SpO2 96%   BMI 30.30 kg/m  BP Readings from Last 3 Encounters:  01/01/24 114/76  12/19/23 120/70  03/19/23 120/60   Wt Readings from Last 3 Encounters:  01/01/24 211 lb 3.2 oz (95.8 kg)  12/19/23 212 lb (96.2 kg)  03/19/23 194 lb (88 kg)   SpO2 Readings from Last 3 Encounters:  01/01/24 96%  03/19/23 98%  03/01/23 96%      Physical Exam Vitals and nursing note reviewed.  Constitutional:      General: He is not in acute distress.    Appearance: Normal appearance. He is well-developed.  HENT:     Head: Normocephalic and atraumatic.     Right Ear: Tympanic membrane, ear canal and external ear normal. There is no impacted cerumen.     Left Ear: Tympanic membrane, ear canal and external ear normal. There is no impacted cerumen.     Nose: Nose normal.     Mouth/Throat:     Mouth: Mucous membranes are moist.     Pharynx: Oropharynx is clear. No oropharyngeal exudate or posterior oropharyngeal erythema.  Eyes:     General: No scleral icterus.       Right eye: No discharge.        Left eye: No discharge.     Conjunctiva/sclera: Conjunctivae normal.     Pupils: Pupils are equal, round, and reactive to light.  Neck:     Thyroid: No thyromegaly.     Vascular: No JVD.  Cardiovascular:     Rate and Rhythm: Normal rate and regular rhythm.     Heart sounds: Normal heart sounds. No murmur heard. Pulmonary:     Effort: Pulmonary effort is normal. No respiratory distress.     Breath sounds: Normal breath sounds.  Abdominal:     General: Bowel sounds are normal. There is no distension.     Palpations: Abdomen is soft. There is no mass.     Tenderness: There is no abdominal tenderness. There is no guarding or rebound.  Musculoskeletal:  General: Normal range of motion.     Cervical back: Normal range of motion and neck supple.     Right lower leg: No edema.     Left lower leg: No edema.   Lymphadenopathy:     Cervical: No cervical adenopathy.  Skin:    General: Skin is warm and dry.     Findings: No erythema or rash.  Neurological:     Mental Status: He is alert and oriented to person, place, and time.     Cranial Nerves: No cranial nerve deficit.     Motor: No abnormal muscle tone.     Deep Tendon Reflexes: Reflexes are normal and symmetric. Reflexes normal.  Psychiatric:        Mood and Affect: Mood normal.        Behavior: Behavior normal.        Thought Content: Thought content normal.        Judgment: Judgment normal.      No results found for any visits on 01/01/24.  Last CBC Lab Results  Component Value Date   WBC 5.6 02/21/2023   HGB 15.7 02/21/2023   HCT 46.0 02/21/2023   MCV 91.5 02/21/2023   MCH 31.2 02/21/2023   RDW 12.6 02/21/2023   PLT 238 02/21/2023   Last metabolic panel Lab Results  Component Value Date   GLUCOSE 101 (H) 12/14/2023   NA 143 12/14/2023   K 5.2 12/14/2023   CL 105 12/14/2023   CO2 22 12/14/2023   BUN 14 12/14/2023   CREATININE 1.25 12/14/2023   EGFR 65 12/14/2023   CALCIUM  9.5 12/14/2023   PROT 6.7 12/14/2023   ALBUMIN  4.4 12/14/2023   LABGLOB 2.3 12/14/2023   AGRATIO 1.7 09/15/2019   BILITOT 0.4 12/14/2023   ALKPHOS 53 12/14/2023   AST 15 12/14/2023   ALT 25 12/14/2023   ANIONGAP 8 02/21/2023   Last lipids Lab Results  Component Value Date   CHOL 113 11/09/2022   HDL 39.50 11/09/2022   LDLCALC 49 11/09/2022   TRIG 122.0 11/09/2022   CHOLHDL 3 11/09/2022   Last hemoglobin A1c Lab Results  Component Value Date   HGBA1C 5.3 01/25/2015   Last thyroid functions Lab Results  Component Value Date   TSH 3.38 12/13/2021   Last vitamin D No results found for: 25OHVITD2, 25OHVITD3, VD25OH Last vitamin B12 and Folate No results found for: VITAMINB12, FOLATE    The 10-year ASCVD risk score (Arnett DK, et al., 2019) is: 8.4%    Assessment & Plan:   Problem List Items Addressed This Visit        Unprioritized   Insomnia   Relevant Orders   Lipid panel   PSA   TSH   Comprehensive metabolic panel   CBC with Differential/Platelet   Preventative health care - Primary   Relevant Medications   diazepam  (VALIUM ) 10 MG tablet   traMADol  (ULTRAM ) 50 MG tablet   Other Relevant Orders   Lipid panel   PSA   TSH   CBC with Differential/Platelet   Primary hypertension   Relevant Orders   Lipid panel   PSA   Hyperlipidemia   Relevant Orders   Lipid panel   PSA   TSH   Comprehensive metabolic panel   CBC with Differential/Platelet   Bilateral hip pain   Relevant Medications   traMADol  (ULTRAM ) 50 MG tablet   Other Visit Diagnoses       Anxiety       Relevant Medications  diazepam  (VALIUM ) 10 MG tablet     Chronic right-sided low back pain with right-sided sciatica       Relevant Medications   diazepam  (VALIUM ) 10 MG tablet   traMADol  (ULTRAM ) 50 MG tablet   Semaglutide -Weight Management 0.25 MG/0.5ML SOAJ   Semaglutide -Weight Management 0.5 MG/0.5ML SOAJ (Start on 01/30/2024)   Semaglutide -Weight Management 1 MG/0.5ML SOAJ (Start on 02/28/2024)   Semaglutide -Weight Management 1.7 MG/0.75ML SOAJ (Start on 03/28/2024)   Semaglutide -Weight Management 2.4 MG/0.75ML SOAJ (Start on 04/26/2024)     Hyperglycemia       Relevant Orders   Hemoglobin A1c     Obesity (BMI 30.0-34.9)       Relevant Medications   Semaglutide -Weight Management 0.25 MG/0.5ML SOAJ   Semaglutide -Weight Management 0.5 MG/0.5ML SOAJ (Start on 01/30/2024)   Semaglutide -Weight Management 1 MG/0.5ML SOAJ (Start on 02/28/2024)   Semaglutide -Weight Management 1.7 MG/0.75ML SOAJ (Start on 03/28/2024)   Semaglutide -Weight Management 2.4 MG/0.75ML SOAJ (Start on 04/26/2024)     Assessment and Plan    Postoperative Knee Pain Postoperative knee pain persists following last Thursday's arthroscopic surgery by Dr. Zoot, where torn cartilage had worn holes in the bone. Dr. Zoot scraped the bone to regenerate blood  flow. No physical therapy is prescribed yet, as it is not typically recommended for this procedure. A follow-up with Dr. Zoot is scheduled for Thursday to discuss home exercises.  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 Prefers non-surgical options. Discussed the potential for lumbar injections, which may provide long-term relief. Will discuss further with Dr. Mavis.  Abdominal Pain Intermittent pain below the rib cage may relate to previous exploratory surgeries and lesions. A recent metabolic panel showed normal liver function. Pain could be due to stretching of lesions. Symptoms will be monitored, and further evaluation will be considered if pain persists.  Elevated Glucose A recent lipid panel indicated a glucose level of 101, slightly above normal. An A1c test is scheduled to assess further.  Weight Management With a BMI of 32 and recent weight increase to 215 lbs, there is interest in starting Wegovy  for weight loss. Insurance coverage is uncertain, so alternatives like Zepbound from [place] are considered. A prescription for Wegovy  will be submitted, and a coupon from [employer] will be checked to reduce costs.  General Health Maintenance Flu shot and pneumonia vaccine were discussed. Has not received the flu shot this year and is eligible for the pneumonia vaccine. Received the first shingles vaccine but is hesitant about the second dose due to previous adverse reactions. The second shingles vaccine dose can cause flu-like symptoms. Will get the flu shot at a pharmacy and consider the pneumonia vaccine and second shingles vaccine after resolving current health issues.        Return in about 3 months (around 03/30/2024), or if symptoms worsen or fail to improve, for weight .    Verlon Carcione R Lowne Chase, DO

## 2024-01-01 NOTE — Patient Instructions (Signed)

## 2024-01-02 ENCOUNTER — Encounter: Payer: Self-pay | Admitting: Nurse Practitioner

## 2024-01-02 ENCOUNTER — Other Ambulatory Visit: Payer: Self-pay | Admitting: Nurse Practitioner

## 2024-01-02 DIAGNOSIS — R972 Elevated prostate specific antigen [PSA]: Secondary | ICD-10-CM

## 2024-01-03 ENCOUNTER — Encounter: Payer: Self-pay | Admitting: Physician Assistant

## 2024-01-03 ENCOUNTER — Ambulatory Visit: Payer: BC Managed Care – PPO | Admitting: Physician Assistant

## 2024-01-03 DIAGNOSIS — Z9889 Other specified postprocedural states: Secondary | ICD-10-CM

## 2024-01-03 MED ORDER — HYDROCODONE-ACETAMINOPHEN 5-325 MG PO TABS: 1.0000 | ORAL_TABLET | Freq: Every day | ORAL | 0 refills | Status: AC | PRN

## 2024-01-03 NOTE — Progress Notes (Signed)
 Post-Op Visit Note   Patient: Jason Williams           Date of Birth: January 22, 1961           MRN: [id number] Visit Date: 01/03/2024 PCP: Antonio Cyndee Jamee JONELLE, DO   Assessment & Plan:  Chief Complaint:  Chief Complaint  Patient presents with   Right Knee - Follow-up    Knee scope 12/27/2023   Visit Diagnoses:  1. S/P right knee arthroscopy     Plan: Patient is a pleasant 63 year old gentleman who comes in today 1 week status post right knee arthroscopic debridement medial meniscus and chondroplasty 12/27/2023.  He did have underlying arthritic change to the medial patellofemoral compartments.  He has been doing okay.  He has been taking Norco for pain.  Examination of the right knee reveals fully healed surgical portals without complication.  Calf is soft nontender.  He is neurovascularly intact distally.  Today, sutures were removed and Steri-Strips applied.  Intraoperative pictures reviewed.  Home exercise program provided.  Follow-up in 5 weeks for recheck.  Call with concerns or questions.  Follow-Up Instructions: Return in about 5 weeks (around 02/07/2024).   Orders:  No orders of the defined types were placed in this encounter.  Meds ordered this encounter  Medications   HYDROcodone -acetaminophen  (NORCO) 5-325 MG tablet    Sig: Take 1-2 tablets by mouth daily as needed.    Dispense:  14 tablet    Refill:  0    Imaging: No new imaging  PMFS History: Patient Active Problem List   Diagnosis Date Noted   Bilateral hip pain 03/19/2023   Cervical spondylosis with myelopathy and radiculopathy 02/28/2023   Suspicious nevus 11/09/2022   Chronic pain syndrome 11/09/2022   Excessive urination at night 04/28/2022   Statin intolerance 07/12/2021   Primary osteoarthritis of right knee 07/12/2021   Acute pain of right knee 12/22/2020   Preventative health care 09/15/2019   Screening for HIV without presence of risk factors 09/15/2019   Encounter for hepatitis C screening test  for low risk patient 09/15/2019   BPH (benign prostatic hyperplasia) 03/31/2016   GERD (gastroesophageal reflux disease) 03/18/2015   ED 10/07/2014   Carpal tunnel syndrome 12/28/2011   Primary hypertension 01/24/2011   SNORING 01/02/2011   Hyperlipidemia 11/01/2010   Anxiety state 11/01/2010   PLANTAR FASCIITIS, BILATERAL 08/15/2010   PAIN IN JOINT, ANKLE AND FOOT 03/10/2010   Episodic mood disorder (HCC) 01/06/2010   Insomnia 01/24/2007   Past Medical History:  Diagnosis Date   Anxiety    Arthritis    Depression    GERD (gastroesophageal reflux disease)    Headache    Heart murmur    as child   History of MRSA infection    toe on right foot   Hyperlipidemia    Hypertension    Sleep apnea    no CPAP    Family History  Problem Relation Age of Onset   Esophageal cancer Mother    Heart attack Father        Died of MI at 63   Mental illness Father    Healthy Sister    Healthy Sister    Heart disease Maternal Grandfather        MI/CABG   Heart attack Maternal Grandfather    Colon cancer Neg Hx    Rectal cancer Neg Hx    Stomach cancer Neg Hx     Past Surgical History:  Procedure Laterality Date  ankle re construction  11/27/2009   bilateral   ANTERIOR CERVICAL DECOMP/DISCECTOMY FUSION N/A 02/28/2023   Procedure: Anterior Cervical Decompression/Discectomy Fusion, Interbody Prosthesis, Plate/Screws Cervical Three-Cervical Four, Cervical Four-Cervical Five, Cervical Five- Cervical Six;  Surgeon: Mavis Purchase, MD;  Location: [facility] OR;  Service: Neurosurgery;  Laterality: N/A;  3C   APPENDECTOMY  11/28/1979   exploratory surgery d/t appendix bursting   Social History   Occupational History   Occupation: Garment/textile Technologist: [employer]  Tobacco Use   Smoking status: Never   Smokeless tobacco: Never  Vaping Use   Vaping status: Never Used  Substance and Sexual Activity   Alcohol use: Yes    Alcohol/week: 2.0 standard drinks of alcohol    Types: 2 Glasses  of wine per week   Drug use: No   Sexual activity: Yes    Birth control/protection: Pill, None    Comment: 1 male

## 2024-01-11 NOTE — Telephone Encounter (Signed)
Results faxed to  Dr. Lindell Spar at Urology

## 2024-01-14 ENCOUNTER — Telehealth: Payer: Self-pay | Admitting: Orthopaedic Surgery

## 2024-01-14 NOTE — Telephone Encounter (Signed)
 Yes that is fine

## 2024-01-14 NOTE — Telephone Encounter (Signed)
 Patient called asked if he can get a note to return back to work on 01/16/2024 without restrictions. Patient said he can pick up the note if he can not print it off mychart. Patient said he need the note by tomorrow. The number to contact patient is [phone]

## 2024-01-15 NOTE — Telephone Encounter (Signed)
 Letter has been sent via MyChart as messaged requested.

## 2024-01-21 DIAGNOSIS — J019 Acute sinusitis, unspecified: Secondary | ICD-10-CM | POA: Diagnosis not present

## 2024-01-21 DIAGNOSIS — R0981 Nasal congestion: Secondary | ICD-10-CM | POA: Diagnosis not present

## 2024-01-21 DIAGNOSIS — B9689 Other specified bacterial agents as the cause of diseases classified elsewhere: Secondary | ICD-10-CM | POA: Diagnosis not present

## 2024-01-29 ENCOUNTER — Other Ambulatory Visit: Payer: Self-pay | Admitting: Family Medicine

## 2024-01-29 ENCOUNTER — Telehealth: Payer: Self-pay

## 2024-01-29 DIAGNOSIS — J0140 Acute pansinusitis, unspecified: Secondary | ICD-10-CM

## 2024-01-29 MED ORDER — FLUTICASONE PROPIONATE 50 MCG/ACT NA SUSP: 2.0000 | Freq: Every day | NASAL | 6 refills | Status: AC

## 2024-01-29 MED ORDER — AZITHROMYCIN 250 MG PO TABS: ORAL_TABLET | ORAL | 0 refills | Status: DC

## 2024-01-29 MED ORDER — LEVOCETIRIZINE DIHYDROCHLORIDE 5 MG PO TABS: 5.0000 mg | ORAL_TABLET | Freq: Every evening | ORAL | 5 refills | Status: DC

## 2024-01-29 NOTE — Telephone Encounter (Signed)
 Copied from CRM [id number]. Topic: Clinical - Medical Advice >> Jan 29, 2024 12:11 PM Fonda Kinder J wrote: Reason for CRM: Pt called in to request a new prescription for an antibiotic and I advised the pt he would have to see a provider and be evaluated before a new prescription can be written. Pt is requesting a callback to discuss this matter with a nurse

## 2024-01-29 NOTE — Telephone Encounter (Signed)
 Looks like patient was seen at [facility] on 02/24. Visit is in Epic

## 2024-01-30 NOTE — Telephone Encounter (Signed)
 Pt called. LDVM

## 2024-01-31 ENCOUNTER — Other Ambulatory Visit: Payer: Self-pay | Admitting: Family Medicine

## 2024-01-31 ENCOUNTER — Telehealth: Payer: Self-pay

## 2024-01-31 DIAGNOSIS — F419 Anxiety disorder, unspecified: Secondary | ICD-10-CM

## 2024-01-31 DIAGNOSIS — M25551 Pain in right hip: Secondary | ICD-10-CM

## 2024-01-31 DIAGNOSIS — Z0000 Encounter for general adult medical examination without abnormal findings: Secondary | ICD-10-CM

## 2024-01-31 MED ORDER — LEVOCETIRIZINE DIHYDROCHLORIDE 5 MG PO TABS: 5.0000 mg | ORAL_TABLET | Freq: Every evening | ORAL | 5 refills | Status: AC

## 2024-01-31 MED ORDER — DIAZEPAM 10 MG PO TABS: 10.0000 mg | ORAL_TABLET | Freq: Three times a day (TID) | ORAL | 0 refills | Status: DC

## 2024-01-31 MED ORDER — TRAMADOL HCL 50 MG PO TABS: 50.0000 mg | ORAL_TABLET | Freq: Three times a day (TID) | ORAL | 0 refills | Status: DC | PRN

## 2024-01-31 NOTE — Telephone Encounter (Signed)
 Copied from CRM [id number]. Topic: General - Other >> Jan 31, 2024 10:32 AM Martinique E wrote: Reason for CRM: Patient called in regarding a missed call that he received from Dr. Maryln Gottron nurse. Patient wanted to relay that his lunch break is from 12pm-1pm to try and answer calls. Patient stated that this was in regards to him being on Amoxicillin for 10 days and still experiencing some chronic throat issues.

## 2024-01-31 NOTE — Telephone Encounter (Signed)
 Spoke with patient.  He will try all medications if not better he will follow up on 02/11/24.  If he is better he will cancel appointment.  He wanted to go ahead and make a follow up appointment.

## 2024-01-31 NOTE — Telephone Encounter (Signed)
 Copied from CRM [id number]. Topic: Clinical - Medication Refill >> Jan 31, 2024 12:38 PM Theodis Sato wrote: Most Recent Primary Care Visit:  Provider: Zola Button, Myrene Buddy R  Department: [facility]  Visit Type: PHYSICAL  Date: 01/01/2024  Medication: traMADol (ULTRAM) 50 MG tablet // diazepam (VALIUM) 10 MG tablet  Has the patient contacted their pharmacy? No, controlled substance (Agent: If no, request that the patient contact the pharmacy for the refill. If patient does not wish to contact the pharmacy document the reason why and proceed with request.) (Agent: If yes, when and what did the pharmacy advise?)  Is this the correct pharmacy for this prescription? Yes If no, delete pharmacy and type the correct one.  This is the patient's preferred pharmacy:  [id number] - [place], [place] - [address] [place] [address] Phone: [phone] Fax: [phone]    Has the prescription been filled recently? Yes  Is the patient out of the medication? No  Has the patient been seen for an appointment in the last year OR does the patient have an upcoming appointment? Yes  Can we respond through MyChart? No   Agent: Please be advised that Rx refills may take up to 3 business days. We ask that you follow-up with your pharmacy.

## 2024-01-31 NOTE — Telephone Encounter (Signed)
 Copied from CRM [id number]. Topic: Clinical - Prescription Issue >> Jan 31, 2024 12:39 PM Jason Williams wrote: Reason for CRM: Patient states the levocetirizine (XYZAL) 5 MG tablet that Dr. Laury Axon ordered is going to cost him $200 + because his insurance doesn't cover it and is requesting an alternative.

## 2024-01-31 NOTE — Addendum Note (Signed)
 Addended by: Thelma Barge D on: 01/31/2024 12:26 PM   Modules accepted: Orders

## 2024-01-31 NOTE — Telephone Encounter (Signed)
 Requesting: diazepam and tramadol  Contract: 03/19/23 UDS: 03/19/23 Last Visit: 01/01/24 Next Visit: 02/11/24 Last Refill: see med list  Please Advise

## 2024-02-01 ENCOUNTER — Other Ambulatory Visit: Payer: Self-pay | Admitting: Family Medicine

## 2024-02-01 ENCOUNTER — Telehealth: Payer: Self-pay

## 2024-02-01 DIAGNOSIS — E66811 Obesity, class 1: Secondary | ICD-10-CM

## 2024-02-01 DIAGNOSIS — R059 Cough, unspecified: Secondary | ICD-10-CM

## 2024-02-01 MED ORDER — BENZONATATE 200 MG PO CAPS
200.0000 mg | ORAL_CAPSULE | Freq: Two times a day (BID) | ORAL | 0 refills | Status: DC | PRN
Start: 2024-02-01 — End: 2024-04-10

## 2024-02-01 NOTE — Telephone Encounter (Signed)
 Pt called. LVM with below details

## 2024-02-01 NOTE — Telephone Encounter (Signed)
 Copied from CRM [id number]. Topic: Clinical - Medical Advice >> Feb 01, 2024 12:45 PM Chantha C wrote: Reason for CRM: Patient wants to benzonatate cough medication to [id number] - [place], [place] - [address] [place] [address] Phone:[phone]. Patient states this medication helps with coughing all the time, and has seen Dr. Laury Axon on this issue already. Please advise and call back if there's an issue [phone].

## 2024-02-06 NOTE — Telephone Encounter (Signed)
 Pt called. LVM to return call or send mychart message

## 2024-02-07 ENCOUNTER — Other Ambulatory Visit: Payer: Self-pay

## 2024-02-07 ENCOUNTER — Telehealth: Payer: Self-pay | Admitting: *Deleted

## 2024-02-07 ENCOUNTER — Other Ambulatory Visit: Payer: Self-pay | Admitting: Family Medicine

## 2024-02-07 DIAGNOSIS — E66811 Obesity, class 1: Secondary | ICD-10-CM

## 2024-02-07 MED ORDER — SEMAGLUTIDE-WEIGHT MANAGEMENT 1 MG/0.5ML ~~LOC~~ SOAJ: 1.0000 mg | SUBCUTANEOUS | 0 refills | Status: AC

## 2024-02-07 NOTE — Telephone Encounter (Signed)
 Spoke with patient. Pt states he was on 0.5 and would like to go to the next dose. Rx sent in. FYI

## 2024-02-07 NOTE — Telephone Encounter (Unsigned)
 Copied from CRM [id number]. Topic: Clinical - Prescription Issue >> Feb 06, 2024  4:49 PM Sim Boast F wrote: Reason for CRM: Patient returning phone call regarding Reginal Lutes, says he has not had an increase and would like an increase

## 2024-02-07 NOTE — Telephone Encounter (Signed)
 Copied from CRM [id number]. Topic: Clinical - Medication Refill >> Feb 07, 2024  4:26 PM Sonny Dandy B wrote: Most Recent Primary Care Visit:  Provider: [name]  Department: [facility]  Visit Type: PHYSICAL  Date: 01/01/2024  Medication: Semaglutide-Weight Management 1 MG/0.5ML SOAJ   Has the patient contacted their pharmacy? Yes (Agent: If no, request that the patient contact the pharmacy for the refill. If patient does not wish to contact the pharmacy document the reason why and proceed with request.) (Agent: If yes, when and what did the pharmacy advise?)  Is this the correct pharmacy for this prescription? Yes If no, delete pharmacy and type the correct one.  This is the patient's preferred pharmacy:  [id number] - [place], [place] - [address] [place] [address] Phone: [phone] Fax: [phone]     Has the prescription been filled recently? Yes  Is the patient out of the medication? Yes  Has the patient been seen for an appointment in the last year OR does the patient have an upcoming appointment? Yes  Can we respond through MyChart? Yes  Agent: Please be advised that Rx refills may take up to 3 business days. We ask that you follow-up with your pharmacy.

## 2024-02-11 ENCOUNTER — Ambulatory Visit: Admitting: Family Medicine

## 2024-02-27 ENCOUNTER — Other Ambulatory Visit: Payer: Self-pay | Admitting: Family Medicine

## 2024-02-27 DIAGNOSIS — Z0000 Encounter for general adult medical examination without abnormal findings: Secondary | ICD-10-CM

## 2024-02-27 DIAGNOSIS — F419 Anxiety disorder, unspecified: Secondary | ICD-10-CM

## 2024-02-27 DIAGNOSIS — M25551 Pain in right hip: Secondary | ICD-10-CM

## 2024-02-27 MED ORDER — DIAZEPAM 10 MG PO TABS: 10.0000 mg | ORAL_TABLET | Freq: Three times a day (TID) | ORAL | 0 refills | Status: DC

## 2024-02-27 MED ORDER — TRAMADOL HCL 50 MG PO TABS: 50.0000 mg | ORAL_TABLET | Freq: Three times a day (TID) | ORAL | 0 refills | Status: DC | PRN

## 2024-02-27 NOTE — Telephone Encounter (Signed)
 Copied from CRM [id number]. Topic: Clinical - Medication Refill >> Feb 27, 2024  3:40 PM [place] E wrote: Most Recent Primary Care Visit:  Provider: [name]  Department: [facility]  Visit Type: PHYSICAL  Date: 01/01/2024  Medication: diazepam (VALIUM) 10 MG tablet traMADol (ULTRAM) 50 MG tablet  Has the patient contacted their pharmacy? No (Agent: If no, request that the patient contact the pharmacy for the refill. If patient does not wish to contact the pharmacy document the reason why and proceed with request.) (Agent: If yes, when and what did the pharmacy advise?)  Is this the correct pharmacy for this prescription? Yes If no, delete pharmacy and type the correct one.  This is the patient's preferred pharmacy:  [id number] - [place], [place] - [address] [place] [address] Phone: [phone] Fax: [phone]   Has the prescription been filled recently? No  Is the patient out of the medication? No, patient has one dose left for both medications.  Has the patient been seen for an appointment in the last year OR does the patient have an upcoming appointment? Yes  Can we respond through MyChart? Yes  Agent: Please be advised that Rx refills may take up to 3 business days. We ask that you follow-up with your pharmacy.

## 2024-02-27 NOTE — Telephone Encounter (Signed)
 Requesting: diazepam and tramadol  Contract: 03/19/23 UDS: 03/19/23 Last Visit: 01/01/24 Next Visit: None  Last Refill: see med list   Please Advise

## 2024-02-29 ENCOUNTER — Other Ambulatory Visit: Payer: Self-pay | Admitting: Family Medicine

## 2024-02-29 DIAGNOSIS — I10 Essential (primary) hypertension: Secondary | ICD-10-CM

## 2024-03-01 ENCOUNTER — Other Ambulatory Visit: Payer: Self-pay | Admitting: Family Medicine

## 2024-03-01 DIAGNOSIS — Z0000 Encounter for general adult medical examination without abnormal findings: Secondary | ICD-10-CM

## 2024-03-01 DIAGNOSIS — M25551 Pain in right hip: Secondary | ICD-10-CM

## 2024-03-04 ENCOUNTER — Other Ambulatory Visit: Payer: Self-pay | Admitting: Family Medicine

## 2024-03-04 DIAGNOSIS — G4700 Insomnia, unspecified: Secondary | ICD-10-CM

## 2024-03-05 ENCOUNTER — Other Ambulatory Visit: Payer: Self-pay | Admitting: Family Medicine

## 2024-03-05 DIAGNOSIS — G4700 Insomnia, unspecified: Secondary | ICD-10-CM

## 2024-03-05 MED ORDER — ZOLPIDEM TARTRATE 10 MG PO TABS: ORAL_TABLET | ORAL | 0 refills | Status: DC

## 2024-03-05 NOTE — Telephone Encounter (Signed)
 Requesting: Ambien  Contract: 03/19/23 UDS: 03/19/23 Last OV: 01/01/2024 Next OV: n/a Last Refill: 12/01/2023, #[id number] RF Database:   Please advise

## 2024-03-05 NOTE — Telephone Encounter (Signed)
 Copied from CRM [id number]. Topic: Clinical - Medication Refill >> Mar 05, 2024  8:42 AM Truddie Crumble wrote: Most Recent Primary Care Visit:  Provider: [name]  Department: [facility]  Visit Type: PHYSICAL  Date: 01/01/2024  Medication: zolpidem (AMBIEN) 10 MG tablet   Has the patient contacted their pharmacy? Yes (Agent: If no, request that the patient contact the pharmacy for the refill. If patient does not wish to contact the pharmacy document the reason why and proceed with request.) (Agent: If yes, when and what did the pharmacy advise?)  Is this the correct pharmacy for this prescription? Yes If no, delete pharmacy and type the correct one.  This is the patient's preferred pharmacy:  [id number] - [place], [place] - [address] [place] [address] Phone: [phone] Fax: [phone]  Has the prescription been filled recently? No  Is the patient out of the medication? Yes  Has the patient been seen for an appointment in the last year OR does the patient have an upcoming appointment? Yes  Can we respond through MyChart? Yes  Agent: Please be advised that Rx refills may take up to 3 business days. We ask that you follow-up with your pharmacy.

## 2024-03-05 NOTE — Telephone Encounter (Signed)
 Copied from CRM [id number]. Topic: Clinical - Medication Refill >> Mar 05, 2024  2:08 PM [name] wrote: Most Recent Primary Care Visit:  Provider: [name]  Department: [facility]  Visit Type: PHYSICAL  Date: 01/01/2024  Medication: zolpidem (AMBIEN) 10 MG tablet  Has the patient contacted their pharmacy? Yes (Agent: If no, request that the patient contact the pharmacy for the refill. If patient does not wish to contact the pharmacy document the reason why and proceed with request.) (Agent: If yes, when and what did the pharmacy advise?)  Is this the correct pharmacy for this prescription? Yes If no, delete pharmacy and type the correct one.  This is the patient's preferred pharmacy:  [id number] - [place], [place] - [address] [place] [address] Phone: [phone] Fax: [phone]  [id number] - [place], [place] - [address] [place] [address] Phone: [phone] Fax: [phone]   Has the prescription been filled recently? No  Is the patient out of the medication? Yes  Has the patient been seen for an appointment in the last year OR does the patient have an upcoming appointment? Yes  Can we respond through MyChart? Yes  Agent: Please be advised that Rx refills may take up to 3 business days. We ask that you follow-up with your pharmacy.

## 2024-03-05 NOTE — Telephone Encounter (Signed)
 Requesting: Ambien Contract:04/10/23 UDS:04/24 Last Visit:02/25 Next Visit:NA Last Refill:12/01/2023  Please Advise

## 2024-03-14 ENCOUNTER — Other Ambulatory Visit: Payer: Self-pay | Admitting: Family Medicine

## 2024-03-14 DIAGNOSIS — E66811 Obesity, class 1: Secondary | ICD-10-CM

## 2024-03-14 NOTE — Telephone Encounter (Signed)
 Copied from CRM [id number]. Topic: Clinical - Medication Refill >> Mar 14, 2024  8:57 AM Deaijah H wrote: Most Recent Primary Care Visit:  Provider: ANTONIO CYNDEE ROCKERS R  Department: [facility]  Visit Type: PHYSICAL  Date: 01/01/2024  Medication: Semaglutide -Weight Management 1 MG/0.5ML SOAJ  Has the patient contacted their pharmacy? Yes (Agent: If no, request that the patient contact the pharmacy for the refill. If patient does not wish to contact the pharmacy document the reason why and proceed with request.) (Agent: If yes, when and what did the pharmacy advise?)  Is this the correct pharmacy for this prescription? Yes If no, delete pharmacy and type the correct one.  This is the patient's preferred pharmacy:  [id number] - [place], [place] - [address] [place] [address] Phone: [phone] Fax: [phone]   Has the prescription been filled recently? Yes  Is the patient out of the medication? Yes  Has the patient been seen for an appointment in the last year OR does the patient have an upcoming appointment? Yes  Can we respond through MyChart? Yes  Agent: Please be advised that Rx refills may take up to 3 business days. We ask that you follow-up with your pharmacy.

## 2024-03-26 ENCOUNTER — Other Ambulatory Visit: Payer: Self-pay | Admitting: Family Medicine

## 2024-03-26 DIAGNOSIS — N529 Male erectile dysfunction, unspecified: Secondary | ICD-10-CM

## 2024-03-26 NOTE — Telephone Encounter (Signed)
 Copied from CRM [id number]. Topic: Clinical - Medication Refill >> Mar 26, 2024 10:21 AM Jason Williams wrote: Most Recent Primary Care Visit:  Provider: Roel Clarity R  Department: [facility]  Visit Type: PHYSICAL  Date: 01/01/2024  Medication: tadalafil  (CIALIS ) 20 MG tablet  Has the patient contacted their pharmacy? Yes (Agent: If no, request that the patient contact the pharmacy for the refill. If patient does not wish to contact the pharmacy document the reason why and proceed with request.) (Agent: If yes, when and what did the pharmacy advise?)  Is this the correct pharmacy for this prescription? Yes If no, delete pharmacy and type the correct one.  This is the patient's preferred pharmacy:   [id number] - [place], [place] - [address] [place] [address] Phone: [phone] Fax: [phone]   Has the prescription been filled recently? Yes  Is the patient out of the medication? Yes  Has the patient been seen for an appointment in the last year OR does the patient have an upcoming appointment? Yes  Can we respond through MyChart? Yes  Agent: Please be advised that Rx refills may take up to 3 business days. We ask that you follow-up with your pharmacy.

## 2024-03-27 MED ORDER — TADALAFIL 20 MG PO TABS: 20.0000 mg | ORAL_TABLET | ORAL | 3 refills | Status: AC | PRN

## 2024-04-07 ENCOUNTER — Other Ambulatory Visit: Payer: Self-pay | Admitting: Family Medicine

## 2024-04-07 DIAGNOSIS — E66811 Obesity, class 1: Secondary | ICD-10-CM

## 2024-04-07 MED ORDER — SEMAGLUTIDE-WEIGHT MANAGEMENT 2.4 MG/0.75ML ~~LOC~~ SOAJ: 2.4000 mg | SUBCUTANEOUS | 1 refills | Status: DC

## 2024-04-07 NOTE — Telephone Encounter (Signed)
 Copied from CRM [id number]. Topic: Clinical - Medication Refill >> Apr 07, 2024  8:32 AM [place] A wrote: Medication: Semaglutide -Weight Management 1.7 MG/0.75ML SOAJ  Has the patient contacted their pharmacy? Yes (Agent: If no, request that the patient contact the pharmacy for the refill. If patient does not wish to contact the pharmacy document the reason why and proceed with request.) (Agent: If yes, when and what did the pharmacy advise?)  This is the patient's preferred pharmacy:  [id number] - [place], [place] - [address] [place] [address] Phone: [phone] Fax: [phone]   Is this the correct pharmacy for this prescription? Yes If no, delete pharmacy and type the correct one.   Has the prescription been filled recently? No  Is the patient out of the medication? Yes  Has the patient been seen for an appointment in the last year OR does the patient have an upcoming appointment? Yes  Can we respond through MyChart? Yes  Agent: Please be advised that Rx refills may take up to 3 business days. We ask that you follow-up with your pharmacy.

## 2024-04-08 ENCOUNTER — Other Ambulatory Visit: Payer: Self-pay | Admitting: Family Medicine

## 2024-04-08 ENCOUNTER — Emergency Department (HOSPITAL_COMMUNITY)
Admission: EM | Admit: 2024-04-08 | Discharge: 2024-04-08 | Discharge status: Against Medical Advice | Attending: Emergency Medicine | Admitting: Emergency Medicine

## 2024-04-08 ENCOUNTER — Telehealth: Payer: Self-pay

## 2024-04-08 ENCOUNTER — Other Ambulatory Visit: Payer: Self-pay

## 2024-04-08 ENCOUNTER — Encounter (HOSPITAL_COMMUNITY): Payer: Self-pay

## 2024-04-08 DIAGNOSIS — I951 Orthostatic hypotension: Secondary | ICD-10-CM | POA: Diagnosis not present

## 2024-04-08 DIAGNOSIS — Z5321 Procedure and treatment not carried out due to patient leaving prior to being seen by health care provider: Secondary | ICD-10-CM | POA: Diagnosis not present

## 2024-04-08 DIAGNOSIS — R55 Syncope and collapse: Secondary | ICD-10-CM | POA: Diagnosis not present

## 2024-04-08 DIAGNOSIS — R42 Dizziness and giddiness: Secondary | ICD-10-CM | POA: Insufficient documentation

## 2024-04-08 DIAGNOSIS — H538 Other visual disturbances: Secondary | ICD-10-CM | POA: Insufficient documentation

## 2024-04-08 LAB — COMPREHENSIVE METABOLIC PANEL WITH GFR
ALT: 22 U/L (ref 0–44)
AST: 17 U/L (ref 15–41)
Albumin: 4.1 g/dL (ref 3.5–5.0)
Alkaline Phosphatase: 56 U/L (ref 38–126)
Anion gap: 9 (ref 5–15)
BUN: 16 mg/dL (ref 8–23)
CO2: 22 mmol/L (ref 22–32)
Calcium: 9.5 mg/dL (ref 8.9–10.3)
Chloride: 107 mmol/L (ref 98–111)
Creatinine, Ser: 1.48 mg/dL — ABNORMAL HIGH (ref 0.61–1.24)
GFR, Estimated: 53 mL/min — ABNORMAL LOW (ref >60)
Glucose, Bld: 98 mg/dL (ref 70–99)
Potassium: 4.4 mmol/L (ref 3.5–5.1)
Sodium: 138 mmol/L (ref 135–145)
Total Bilirubin: 0.7 mg/dL (ref 0.0–1.2)
Total Protein: 7.1 g/dL (ref 6.5–8.1)

## 2024-04-08 LAB — CBC WITH DIFFERENTIAL/PLATELET
Abs Immature Granulocytes: 0.02 10*3/uL (ref 0.00–0.07)
Basophils Absolute: 0 10*3/uL (ref 0.0–0.1)
Basophils Relative: 0 %
Eosinophils Absolute: 0.1 10*3/uL (ref 0.0–0.5)
Eosinophils Relative: 2 %
HCT: 43.1 % (ref 39.0–52.0)
Hemoglobin: 14.5 g/dL (ref 13.0–17.0)
Immature Granulocytes: 0 %
Lymphocytes Relative: 19 %
Lymphs Abs: 1.1 10*3/uL (ref 0.7–4.0)
MCH: 30.8 pg (ref 26.0–34.0)
MCHC: 33.6 g/dL (ref 30.0–36.0)
MCV: 91.5 fL (ref 80.0–100.0)
Monocytes Absolute: 0.4 10*3/uL (ref 0.1–1.0)
Monocytes Relative: 8 %
Neutro Abs: 4 10*3/uL (ref 1.7–7.7)
Neutrophils Relative %: 71 %
Platelets: 245 10*3/uL (ref 150–400)
RBC: 4.71 MIL/uL (ref 4.22–5.81)
RDW: 13.2 % (ref 11.5–15.5)
WBC: 5.7 10*3/uL (ref 4.0–10.5)
nRBC: 0 % (ref 0.0–0.2)

## 2024-04-08 LAB — TROPONIN I (HIGH SENSITIVITY): Troponin I (High Sensitivity): 3 ng/L (ref <18)

## 2024-04-08 MED ORDER — LACTATED RINGERS IV BOLUS: 1000.0000 mL | Freq: Once | INTRAVENOUS | Status: DC

## 2024-04-08 NOTE — Telephone Encounter (Signed)
 Copied from CRM [id number]. Topic: Appointments - Appointment Scheduling >> Apr 08, 2024  3:25 PM Allyne Areola wrote: Patient/patient representative is calling to schedule an appointment. Refer to attachments for appointment information. Patient is calling because he went to the ED and was told his Troponin levels were high, attempted to schedule a hospital f/u but Roel Clarity next appointment is not available until May 22nd and he would like to be seen sooner.

## 2024-04-08 NOTE — ED Provider Triage Note (Signed)
 Emergency Medicine Provider Triage Evaluation Note  Jason Williams , a 63 y.o. male  was evaluated in triage.  Pt complains of lower blood pressure and near syncopal events for the past month. Reports he has lost 30+ pounds with Wegovy . Doesn't feel like he is staying well hydrated. Denies any chest pain or SOB. Reports that he was getting tunnel vision with going from bending forward to standing. Reports he had a brain "zap" with trying to have a BM last night. No headache or visual changes since then.  Review of Systems  Positive:  Negative:   Physical Exam  BP 100/66 (BP Location: Left Arm)   Pulse 87   Temp 97.6 F (36.4 C) (Oral)   Resp 18   Ht 5\' 10"  (1.778 m)   Wt 83.9 kg   SpO2 98%   BMI 26.54 kg/m  Gen:   Awake, no distress   Resp:  Normal effort  MSK:   Moves extremities without difficulty  Other:  Normal speech. Cranial nerves grossly intact. Strength intact and symmetric in the upper and lower ext.   Medical Decision Making  Medically screening exam initiated at 11:14 AM.  Appropriate orders placed.  Jason Williams was informed that the remainder of the evaluation will be completed by another provider, this initial triage assessment does not replace that evaluation, and the importance of remaining in the ED until their evaluation is complete.  Likely some orthostatic hypotension. He does not appear in any acute distress. Will order basic labs and IVF.    Spence Dux, PA-C 04/08/24 1118

## 2024-04-08 NOTE — ED Notes (Signed)
 Patient just left AMA stating the wait is too long

## 2024-04-08 NOTE — ED Triage Notes (Signed)
 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  States that last night he had an episode that felt like an electrical zap and got some tunnel vision, states that when he bends over and then stands up he gets dizzy and tunnel vision.

## 2024-04-09 NOTE — Telephone Encounter (Signed)
 Pt scheduled for Thursday.

## 2024-04-10 ENCOUNTER — Ambulatory Visit (INDEPENDENT_AMBULATORY_CARE_PROVIDER_SITE_OTHER): Admitting: Family Medicine

## 2024-04-10 ENCOUNTER — Encounter: Payer: Self-pay | Admitting: Family Medicine

## 2024-04-10 VITALS — BP 108/70 | HR 83 | Temp 98.0°F | Resp 18 | Ht 70.0 in | Wt 186.4 lb

## 2024-04-10 DIAGNOSIS — I10 Essential (primary) hypertension: Secondary | ICD-10-CM

## 2024-04-10 DIAGNOSIS — R079 Chest pain, unspecified: Secondary | ICD-10-CM

## 2024-04-10 DIAGNOSIS — E785 Hyperlipidemia, unspecified: Secondary | ICD-10-CM

## 2024-04-10 DIAGNOSIS — R42 Dizziness and giddiness: Secondary | ICD-10-CM | POA: Diagnosis not present

## 2024-04-10 DIAGNOSIS — R739 Hyperglycemia, unspecified: Secondary | ICD-10-CM

## 2024-04-10 MED ORDER — AMLODIPINE BESYLATE 5 MG PO TABS: 5.0000 mg | ORAL_TABLET | Freq: Every day | ORAL | 1 refills | Status: DC

## 2024-04-10 NOTE — Progress Notes (Signed)
 Established Patient Office Visit  Subjective   Patient ID: Jason Williams, male    DOB: 25-Dec-1960  Age: 63 y.o. MRN: [id number]  Chief Complaint  Patient presents with   ED follow up    HPI Discussed the use of AI scribe software for clinical note transcription with the patient, who gave verbal consent to proceed.  History of Present Illness Jason Williams is a 63 year old male with hypertension and coronary artery disease who presents with dizziness and hypotension.  He has been experiencing dizziness for the past month, particularly when bending down or standing up, described as a sensation of almost passing out. His blood pressure was recorded at 100/60 mmHg during a recent ER visit. He has been taking lisinopril  but has only taken it once in the last seven days. He also reports a significant weight loss of 30 pounds recently.  He mentions a history of high glucose and creatinine levels, with a low GFR noted during his ER visit. No dehydration was reported at the time of the tests. He is concerned about the impact of his prostate condition on his kidney function.  He has a history of coronary artery disease, with a previous cardiac CT showing a calcium  score of 387. He has been on Praluent or pravastatin  in the past. He reports a normal troponin level of 3 during his recent ER visit, with no significant findings on EKGs despite experiencing chest tightness in the past.  He reports a prostate issue, with an ultrasound showing a prostate size of 70 grams. He is currently taking alfuzosin, which he believes is not effectively managing his symptoms of urinary difficulty.  He describes an episode of a 'lightning bolt' sensation in his head, leading to tunnel vision, which occurred while he was in the bathroom. He did not experience any headache or other symptoms following this event. He has been experiencing dizziness for weeks prior to this incident.  He has a history of neck surgery  and spinal stenosis, which he feels has impacted his overall health. He also underwent knee surgery in April, which has limited his physical activity.  He is currently taking amlodipine  and has been monitoring his blood pressure at home, noting readings of 125/85 mmHg.  He reports significant lifestyle changes, including a 30-pound weight loss, improved diet, and increased water intake. He has reduced his consumption of red meat and Diet Coke, focusing on eating more salads and protein-rich foods. He mentions a history of COVID-19, which affected his eating habits.   Patient Active Problem List   Diagnosis Date Noted   Hyperglycemia 04/11/2024   Dizziness 04/11/2024   Bilateral hip pain 03/19/2023   Cervical spondylosis with myelopathy and radiculopathy 02/28/2023   Suspicious nevus 11/09/2022   Chronic pain syndrome 11/09/2022   Excessive urination at night 04/28/2022   Statin intolerance 07/12/2021   Primary osteoarthritis of right knee 07/12/2021   Acute pain of right knee 12/22/2020   Preventative health care 09/15/2019   Screening for HIV without presence of risk factors 09/15/2019   Encounter for hepatitis C screening test for low risk patient 09/15/2019   BPH (benign prostatic hyperplasia) 03/31/2016   GERD (gastroesophageal reflux disease) 03/18/2015   ED 10/07/2014   Chest pain 03/20/2014   Carpal tunnel syndrome 12/28/2011   Primary hypertension 01/24/2011   SNORING 01/02/2011   Hyperlipidemia 11/01/2010   Anxiety state 11/01/2010   PLANTAR FASCIITIS, BILATERAL 08/15/2010   PAIN IN JOINT, ANKLE AND FOOT 03/10/2010  Episodic mood disorder (HCC) 01/06/2010   Insomnia 01/24/2007   Past Medical History:  Diagnosis Date   Anxiety    Arthritis    Depression    GERD (gastroesophageal reflux disease)    Headache    Heart murmur    as child   History of MRSA infection    toe on right foot   Hyperlipidemia    Hypertension    Sleep apnea    no CPAP   Past Surgical  History:  Procedure Laterality Date   ankle re construction  11/27/2009   bilateral   ANTERIOR CERVICAL DECOMP/DISCECTOMY FUSION N/A 02/28/2023   Procedure: Anterior Cervical Decompression/Discectomy Fusion, Interbody Prosthesis, Plate/Screws Cervical Three-Cervical Four, Cervical Four-Cervical Five, Cervical Five- Cervical Six;  Surgeon: Garry Kansas, MD;  Location[facility] OR;  Service: Neurosurgery;  Laterality: N/A;  3C   APPENDECTOMY  11/28/1979   exploratory surgery d/t appendix bursting   Social History   Tobacco Use   Smoking status: Never   Smokeless tobacco: Never  Vaping Use   Vaping status: Never Used  Substance Use Topics   Alcohol use: Yes    Alcohol/week: 2.0 standard drinks of alcohol    Types: 2 Glasses of wine per week   Drug use: No   Social History   Socioeconomic History   Marital status: Single    Spouse name: Not on file   Number of children: 1   Years of education: Not on file   Highest education level: Associate degree: academic program  Occupational History   Occupation: Garment/textile technologist: [employer]  Tobacco Use   Smoking status: Never   Smokeless tobacco: Never  Vaping Use   Vaping status: Never Used  Substance and Sexual Activity   Alcohol use: Yes    Alcohol/week: 2.0 standard drinks of alcohol    Types: 2 Glasses of wine per week   Drug use: No   Sexual activity: Yes    Birth control/protection: Pill, None    Comment: 1 male  Other Topics Concern   Not on file  Social History Narrative   Not on file   Social Drivers of Health   Financial Resource Strain: Low Risk  (12/30/2023)   Overall Financial Resource Strain (CARDIA)    Difficulty of Paying Living Expenses: Not hard at all  Food Insecurity: No Food Insecurity (12/30/2023)   Hunger Vital Sign    Worried About Running Out of Food in the Last Year: Never true    Ran Out of Food in the Last Year: Never true  Transportation Needs: No Transportation Needs (12/30/2023)    PRAPARE - Administrator, Civil Service (Medical): No    Lack of Transportation (Non-Medical): No  Physical Activity: Sufficiently Active (12/30/2023)   Exercise Vital Sign    Days of Exercise per Week: 4 days    Minutes of Exercise per Session: 60 min  Stress: Stress Concern Present (12/30/2023)   [employer] of Occupational Health - Occupational Stress Questionnaire    Feeling of Stress : To some extent  Social Connections: Unknown (12/30/2023)   Social Connection and Isolation Panel [NHANES]    Frequency of Communication with Friends and Family: Three times a week    Frequency of Social Gatherings with Friends and Family: Twice a week    Attends Religious Services: Patient declined    Active Member of Clubs or Organizations: Yes    Attends Banker Meetings: 1 to 4 times per year  Marital Status: Divorced  Catering manager Violence: Not on file   Family Status  Relation Name Status   Mother  Deceased   Father  Deceased   Sister  Alive   Sister  Alive   MGF  Deceased   Neg Hx  (Not Specified)  No partnership data on file   Family History  Problem Relation Age of Onset   Esophageal cancer Mother    Heart attack Father        Died of MI at 78   Mental illness Father    Healthy Sister    Healthy Sister    Heart disease Maternal Grandfather        MI/CABG   Heart attack Maternal Grandfather    Colon cancer Neg Hx    Rectal cancer Neg Hx    Stomach cancer Neg Hx    No Known Allergies    Review of Systems  Constitutional:  Negative for fever and malaise/fatigue.  HENT:  Negative for congestion.   Eyes:  Negative for blurred vision.  Respiratory:  Negative for cough and shortness of breath.   Cardiovascular:  Positive for chest pain. Negative for palpitations and leg swelling.  Gastrointestinal:  Negative for abdominal pain, blood in stool, nausea and vomiting.  Genitourinary:  Negative for dysuria and frequency.  Musculoskeletal:  Negative  for back pain and falls.  Skin:  Negative for rash.  Neurological:  Positive for dizziness. Negative for loss of consciousness and headaches.  Endo/Heme/Allergies:  Negative for environmental allergies.  Psychiatric/Behavioral:  Negative for depression. The patient is not nervous/anxious.       Objective:     BP 108/70 (BP Location: Left Arm, Patient Position: Sitting, Cuff Size: Normal)   Pulse 83   Temp 98 F (36.7 C) (Oral)   Resp 18   Ht 5\' 10"  (1.778 m)   Wt 186 lb 6.4 oz (84.6 kg)   SpO2 96%   BMI 26.75 kg/m     Physical Exam Vitals and nursing note reviewed.  Constitutional:      General: He is not in acute distress.    Appearance: Normal appearance. He is well-developed.  HENT:     Head: Normocephalic and atraumatic.  Eyes:     General: No scleral icterus.       Right eye: No discharge.        Left eye: No discharge.  Cardiovascular:     Rate and Rhythm: Normal rate and regular rhythm.     Heart sounds: No murmur heard. Pulmonary:     Effort: Pulmonary effort is normal. No respiratory distress.     Breath sounds: Normal breath sounds.  Musculoskeletal:        General: Normal range of motion.     Cervical back: Normal range of motion and neck supple.     Right lower leg: No edema.     Left lower leg: No edema.  Skin:    General: Skin is warm and dry.  Neurological:     Mental Status: He is alert and oriented to person, place, and time.  Psychiatric:        Mood and Affect: Mood normal.        Behavior: Behavior normal.        Thought Content: Thought content normal.        Judgment: Judgment normal.      No results found for any visits on 04/10/24.  Last CBC Lab Results  Component Value Date  WBC 5.7 04/08/2024   HGB 14.5 04/08/2024   HCT 43.1 04/08/2024   MCV 91.5 04/08/2024   MCH 30.8 04/08/2024   RDW 13.2 04/08/2024   PLT 245 04/08/2024   Last metabolic panel Lab Results  Component Value Date   GLUCOSE 98 04/08/2024   NA 138  04/08/2024   K 4.4 04/08/2024   CL 107 04/08/2024   CO2 22 04/08/2024   BUN 16 04/08/2024   CREATININE 1.48 (H) 04/08/2024   GFRNONAA 53 (L) 04/08/2024   CALCIUM  9.5 04/08/2024   PROT 7.1 04/08/2024   ALBUMIN 4.1 04/08/2024   LABGLOB 2.3 12/14/2023   AGRATIO 1.7 09/15/2019   BILITOT 0.7 04/08/2024   ALKPHOS 56 04/08/2024   AST 17 04/08/2024   ALT 22 04/08/2024   ANIONGAP 9 04/08/2024   Last lipids Lab Results  Component Value Date   CHOL 114 01/01/2024   HDL 44.30 01/01/2024   LDLCALC 35 01/01/2024   TRIG 172.0 (H) 01/01/2024   CHOLHDL 3 01/01/2024   Last hemoglobin A1c Lab Results  Component Value Date   HGBA1C 5.7 01/01/2024   Last thyroid functions Lab Results  Component Value Date   TSH 2.42 01/01/2024   Last vitamin D No results found for: "25OHVITD2", "25OHVITD3", "VD25OH"  The ASCVD Risk score (Arnett DK, et al., 2019) failed to calculate for the following reasons:   The valid total cholesterol range is 130 to 320 mg/dL    Assessment & Plan:   Problem List Items Addressed This Visit       Unprioritized   Primary hypertension   Relevant Medications   amLODipine  (NORVASC ) 5 MG tablet   Hyperlipidemia   Encourage heart healthy diet such as MIND or DASH diet, increase exercise, avoid trans fats, simple carbohydrates and processed foods, consider a krill or fish or flaxseed oil cap daily.        Relevant Medications   amLODipine  (NORVASC ) 5 MG tablet   Hyperglycemia   Recheck labs  Watch simple sugars and starches        Relevant Orders   CBC with Differential/Platelet   Comprehensive metabolic panel with GFR   Lipid panel   TSH   Hemoglobin A1c   Dizziness   Relevant Orders   Ambulatory referral to Cardiology   Chest pain - Primary   ER EKG and labs reviewed F/u cardiology        Relevant Orders   Ambulatory referral to Cardiology  Assessment and Plan Assessment & Plan Dizziness   He has experienced dizziness for over a month,  associated with hypotension, particularly when bending down or standing up, leading to near syncope. Blood pressure was recorded at 100/60 mmHg during an ER visit. There is a possible correlation with blood pressure medication and significant weight loss of 30 lbs. Decrease Norvasc  (amlodipine ) to 5 mg daily and advise taking it at night to potentially reduce dizziness. Monitor blood pressure regularly and refer to cardiology for further evaluation.  Hypotension   His blood pressure is at 100/60 mmHg, likely related to antihypertensive medication and recent weight loss. Symptoms include dizziness and near syncope. Discussed the potential impact of weight loss on blood pressure and the need to adjust medication accordingly. Decrease Norvasc  (amlodipine ) to 5 mg daily. Monitor blood pressure regularly and refer to cardiology for further evaluation.  Chronic Kidney Disease, unspecified   Elevated creatinine and low GFR are noted. Possible dehydration is considered but not confirmed. Weight loss was expected to improve kidney function, but  current labs suggest otherwise. Repeat kidney function tests and order HbA1c to assess glucose control.  Benign Prostatic Hyperplasia   Prostate size is 70 grams with PSA levels consistent with prostate size. He experiences significant difficulty urinating. Current management with alfuzosin is in place, but symptoms persist. Contact urologist to discuss advancing treatment due to significant urinary symptoms. Continue alfuzosin as prescribed.  Neck surgery   Following neck fusion surgery, he has ongoing symptoms including limited neck mobility and possible residual spinal or nerve issues. No acute changes are noted. Consider referral to neurology if symptoms persist or worsen.  c   No follow-ups on file.    Tana Trefry R Lowne Chase, DO

## 2024-04-11 DIAGNOSIS — R739 Hyperglycemia, unspecified: Secondary | ICD-10-CM | POA: Insufficient documentation

## 2024-04-11 DIAGNOSIS — R42 Dizziness and giddiness: Secondary | ICD-10-CM | POA: Insufficient documentation

## 2024-04-11 LAB — COMPREHENSIVE METABOLIC PANEL WITH GFR
ALT: 20 U/L (ref 0–53)
AST: 14 U/L (ref 0–37)
Albumin: 4.4 g/dL (ref 3.5–5.2)
Alkaline Phosphatase: 58 U/L (ref 39–117)
BUN: 13 mg/dL (ref 6–23)
CO2: 26 meq/L (ref 19–32)
Calcium: 9.4 mg/dL (ref 8.4–10.5)
Chloride: 104 meq/L (ref 96–112)
Creatinine, Ser: 1.07 mg/dL (ref 0.40–1.50)
GFR: 74.18 mL/min (ref >60.00)
Glucose, Bld: 91 mg/dL (ref 70–99)
Potassium: 4.4 meq/L (ref 3.5–5.1)
Sodium: 139 meq/L (ref 135–145)
Total Bilirubin: 0.5 mg/dL (ref 0.2–1.2)
Total Protein: 6.8 g/dL (ref 6.0–8.3)

## 2024-04-11 LAB — LIPID PANEL
Cholesterol: 108 mg/dL (ref 0–200)
HDL: 46.9 mg/dL (ref >39.00)
LDL Cholesterol: 44 mg/dL (ref 0–99)
NonHDL: 61.26
Total CHOL/HDL Ratio: 2
Triglycerides: 86 mg/dL (ref 0.0–149.0)
VLDL: 17.2 mg/dL (ref 0.0–40.0)

## 2024-04-11 LAB — CBC WITH DIFFERENTIAL/PLATELET
Basophils Absolute: 0 10*3/uL (ref 0.0–0.1)
Basophils Relative: 0.5 % (ref 0.0–3.0)
Eosinophils Absolute: 0.1 10*3/uL (ref 0.0–0.7)
Eosinophils Relative: 1.8 % (ref 0.0–5.0)
HCT: 43 % (ref 39.0–52.0)
Hemoglobin: 14.6 g/dL (ref 13.0–17.0)
Lymphocytes Relative: 21.5 % (ref 12.0–46.0)
Lymphs Abs: 1.4 10*3/uL (ref 0.7–4.0)
MCHC: 33.8 g/dL (ref 30.0–36.0)
MCV: 90.1 fl (ref 78.0–100.0)
Monocytes Absolute: 0.5 10*3/uL (ref 0.1–1.0)
Monocytes Relative: 7.9 % (ref 3.0–12.0)
Neutro Abs: 4.3 10*3/uL (ref 1.4–7.7)
Neutrophils Relative %: 68.3 % (ref 43.0–77.0)
Platelets: 259 10*3/uL (ref 150.0–400.0)
RBC: 4.78 Mil/uL (ref 4.22–5.81)
RDW: 13.6 % (ref 11.5–15.5)
WBC: 6.3 10*3/uL (ref 4.0–10.5)

## 2024-04-11 LAB — HEMOGLOBIN A1C: Hgb A1c MFr Bld: 5.4 % (ref 4.6–6.5)

## 2024-04-11 NOTE — Assessment & Plan Note (Signed)
 Recheck labs  Watch simple sugars and starches

## 2024-04-11 NOTE — Assessment & Plan Note (Signed)
 ER EKG and labs reviewed F/u cardiology

## 2024-04-11 NOTE — Assessment & Plan Note (Signed)
 Encourage heart healthy diet such as MIND or DASH diet, increase exercise, avoid trans fats, simple carbohydrates and processed foods, consider a krill or fish or flaxseed oil cap daily.

## 2024-04-14 ENCOUNTER — Telehealth: Payer: Self-pay

## 2024-04-14 NOTE — Telephone Encounter (Signed)
 Copied from CRM [id number]. Topic: Clinical - Lab/Test Results >> Apr 14, 2024  1:14 PM Turkey A wrote: Reason for CRM: Patient called for Lab results, results show that they are not released. Patient would like a call back as to why?

## 2024-04-14 NOTE — Telephone Encounter (Signed)
Labs not back yet

## 2024-04-15 ENCOUNTER — Ambulatory Visit: Payer: Self-pay | Admitting: Family Medicine

## 2024-04-15 LAB — TSH: TSH: 3.21 u[IU]/mL (ref 0.35–5.50)

## 2024-04-21 ENCOUNTER — Other Ambulatory Visit: Payer: Self-pay | Admitting: Internal Medicine

## 2024-04-21 DIAGNOSIS — I2510 Atherosclerotic heart disease of native coronary artery without angina pectoris: Secondary | ICD-10-CM

## 2024-04-21 DIAGNOSIS — E785 Hyperlipidemia, unspecified: Secondary | ICD-10-CM

## 2024-04-22 ENCOUNTER — Other Ambulatory Visit: Payer: Self-pay | Admitting: Family Medicine

## 2024-04-22 ENCOUNTER — Telehealth: Payer: Self-pay | Admitting: Family Medicine

## 2024-04-22 DIAGNOSIS — F419 Anxiety disorder, unspecified: Secondary | ICD-10-CM

## 2024-04-22 DIAGNOSIS — M25551 Pain in right hip: Secondary | ICD-10-CM

## 2024-04-22 DIAGNOSIS — Z0000 Encounter for general adult medical examination without abnormal findings: Secondary | ICD-10-CM

## 2024-04-22 NOTE — Telephone Encounter (Signed)
 Requesting: diazepam  10mg , tramadol  50mg   Contract:03/19/23 UDS: 03/19/23 Last Visit: 04/10/24 Next Visit: None Last Refill: see med list   Please Advise

## 2024-04-22 NOTE — Telephone Encounter (Unsigned)
 Copied from CRM [id number]. Topic: Clinical - Medication Refill >> Apr 22, 2024  2:27 PM Adonis Hoot wrote: Medication:  traMADol  (ULTRAM ) 50 MG tablet  diazepam  (VALIUM ) 10 MG tablet Has the patient contacted their pharmacy? No (Agent: If no, request that the patient contact the pharmacy for the refill. If patient does not wish to contact the pharmacy document the reason why and proceed with request.) (Agent: If yes, when and what did the pharmacy advise?)  This is the patient's preferred pharmacy:  [id number] - [place], [place] - [address] [place] [address] Phone: [phone] Fax: [phone]    Is this the correct pharmacy for this prescription? Yes If no, delete pharmacy and type the correct one.   Has the prescription been filled recently? No  Is the patient out of the medication? Yes  Has the patient been seen for an appointment in the last year OR does the patient have an upcoming appointment? Yes  Can we respond through MyChart? Yes  Agent: Please be advised that Rx refills may take up to 3 business days. We ask that you follow-up with your pharmacy.

## 2024-04-23 NOTE — Telephone Encounter (Signed)
 Medication sent to pharmacy, confirmed with pharmacy. No further action needed.

## 2024-04-30 DIAGNOSIS — R972 Elevated prostate specific antigen [PSA]: Secondary | ICD-10-CM | POA: Diagnosis not present

## 2024-05-09 ENCOUNTER — Telehealth: Payer: Self-pay | Admitting: Family Medicine

## 2024-05-09 DIAGNOSIS — N401 Enlarged prostate with lower urinary tract symptoms: Secondary | ICD-10-CM | POA: Diagnosis not present

## 2024-05-09 DIAGNOSIS — R351 Nocturia: Secondary | ICD-10-CM | POA: Diagnosis not present

## 2024-05-09 DIAGNOSIS — R972 Elevated prostate specific antigen [PSA]: Secondary | ICD-10-CM | POA: Diagnosis not present

## 2024-05-09 NOTE — Telephone Encounter (Signed)
 Referral placed earlier this year and patient doesn't want to go to [facility]. Do I need to place a new referral?

## 2024-05-09 NOTE — Telephone Encounter (Signed)
 Copied from CRM [id number]. Topic: Referral - Request for Referral >> May 09, 2024  9:00 AM Kita Perish H wrote: Did the patient discuss referral with their provider in the last year? Yes (If No - schedule appointment) (If Yes - send message)  Appointment offered? No  Type of order/referral and detailed reason for visit: Urology  Preference of office, provider, location: [facility]  If referral order, have you been seen by this specialty before? Yes, [facility] does not want to [facility] (If Yes, this issue or another issue? When? Where?  Can we respond through MyChart? Yes

## 2024-05-15 ENCOUNTER — Other Ambulatory Visit: Payer: Self-pay | Admitting: Family Medicine

## 2024-05-15 ENCOUNTER — Telehealth: Payer: Self-pay | Admitting: Family Medicine

## 2024-05-15 DIAGNOSIS — K219 Gastro-esophageal reflux disease without esophagitis: Secondary | ICD-10-CM

## 2024-05-15 MED ORDER — ESOMEPRAZOLE MAGNESIUM 40 MG PO CPDR: 40.0000 mg | DELAYED_RELEASE_CAPSULE | Freq: Every day | ORAL | 3 refills | Status: AC

## 2024-05-15 NOTE — Telephone Encounter (Signed)
 Copied from CRM [id number]. Topic: Clinical - Medication Refill >> May 15, 2024  8:07 AM Howard Macho wrote: Medication: nexium   Has the patient contacted their pharmacy? Yes (Agent: If no, request that the patient contact the pharmacy for the refill. If patient does not wish to contact the pharmacy document the reason why and proceed with request.) (Agent: If yes, when and what did the pharmacy advise?)  This is the patient's preferred pharmacy:  [id number] - [place], [place] - [address] [place] [address] Phone: [phone] Fax: [phone]  Is this the correct pharmacy for this prescription? Yes If no, delete pharmacy and type the correct one.   Has the prescription been filled recently? No  Is the patient out of the medication? Yes  Has the patient been seen for an appointment in the last year OR does the patient have an upcoming appointment? Yes  Can we respond through MyChart? Yes  Agent: Please be advised that Rx refills may take up to 3 business days. We ask that you follow-up with your pharmacy.

## 2024-05-15 NOTE — Telephone Encounter (Signed)
 Please advise- nexium  not on med list?

## 2024-05-21 ENCOUNTER — Other Ambulatory Visit: Payer: Self-pay | Admitting: Family Medicine

## 2024-05-21 DIAGNOSIS — M25551 Pain in right hip: Secondary | ICD-10-CM

## 2024-05-21 DIAGNOSIS — Z0000 Encounter for general adult medical examination without abnormal findings: Secondary | ICD-10-CM

## 2024-05-21 DIAGNOSIS — F419 Anxiety disorder, unspecified: Secondary | ICD-10-CM

## 2024-05-21 NOTE — Telephone Encounter (Signed)
 Requesting: diazepam  and tramadol   Contract: 03/19/23 UDS: 03/19/23 Last Visit: 04/10/24 Next Visit: None Last Refill: see med list   Please Advise

## 2024-05-21 NOTE — Telephone Encounter (Unsigned)
 Copied from CRM [id number]. Topic: Clinical - Medication Refill >> May 21, 2024  9:22 AM Geroldine GRADE wrote: Medication: diazepam  (VALIUM ) 10 MG tablet traMADol  (ULTRAM ) 50 MG tablet    Has the patient contacted their pharmacy? Yes (Agent: If no, request that the patient contact the pharmacy for the refill. If patient does not wish to contact the pharmacy document the reason why and proceed with request.) (Agent: If yes, when and what did the pharmacy advise?)  This is the patient's preferred pharmacy:  [id number] [place], [place] - [address] [place] [address] Phone: [phone] Fax: [phone]   Is this the correct pharmacy for this prescription? Yes If no, delete pharmacy and type the correct one.   Has the prescription been filled recently? No  Is the patient out of the medication? No  Has the patient been seen for an appointment in the last year OR does the patient have an upcoming appointment? Yes  Can we respond through MyChart? Yes  Agent: Please be advised that Rx refills may take up to 3 business days. We ask that you follow-up with your pharmacy.

## 2024-05-22 MED ORDER — DIAZEPAM 10 MG PO TABS: 10.0000 mg | ORAL_TABLET | Freq: Three times a day (TID) | ORAL | 0 refills | Status: DC

## 2024-05-22 MED ORDER — TRAMADOL HCL 50 MG PO TABS
50.0000 mg | ORAL_TABLET | Freq: Four times a day (QID) | ORAL | 0 refills | Status: DC | PRN
Start: 2024-05-22 — End: 2024-06-19

## 2024-06-04 DIAGNOSIS — C61 Malignant neoplasm of prostate: Secondary | ICD-10-CM | POA: Diagnosis not present

## 2024-06-11 ENCOUNTER — Telehealth: Payer: Self-pay | Admitting: Family Medicine

## 2024-06-11 DIAGNOSIS — C61 Malignant neoplasm of prostate: Secondary | ICD-10-CM

## 2024-06-11 NOTE — Telephone Encounter (Signed)
 Records received, biopsy confirms prostate adenocarcinoma. Pt requesting referral to a different urology group, not happy w/ care. Please advise.

## 2024-06-11 NOTE — Telephone Encounter (Signed)
 Copied from CRM [id number]. Topic: Referral - Question >> Jun 11, 2024 11:01 AM Robinson H wrote: Reason for CRM: Going to [facility] and diagnosed with prostate cancer, not happy with care he's receiving from them and wants to see if he can get setup through [facility] instead of going through [facility].  Yani [phone]

## 2024-06-11 NOTE — Telephone Encounter (Signed)
 Have requested prostate bx results from [facility], awaiting results.

## 2024-06-12 NOTE — Telephone Encounter (Signed)
 Urology referral

## 2024-06-12 NOTE — Addendum Note (Signed)
 Addended by: Mollye Guinta D on: 06/12/2024 07:42 AM   Modules accepted: Orders

## 2024-06-17 ENCOUNTER — Other Ambulatory Visit: Payer: Self-pay | Admitting: Family Medicine

## 2024-06-17 DIAGNOSIS — C61 Malignant neoplasm of prostate: Secondary | ICD-10-CM | POA: Diagnosis not present

## 2024-06-17 DIAGNOSIS — E66811 Obesity, class 1: Secondary | ICD-10-CM

## 2024-06-17 DIAGNOSIS — R351 Nocturia: Secondary | ICD-10-CM | POA: Diagnosis not present

## 2024-06-19 ENCOUNTER — Other Ambulatory Visit: Payer: Self-pay | Admitting: Family Medicine

## 2024-06-19 DIAGNOSIS — Z0000 Encounter for general adult medical examination without abnormal findings: Secondary | ICD-10-CM

## 2024-06-19 DIAGNOSIS — F419 Anxiety disorder, unspecified: Secondary | ICD-10-CM

## 2024-06-19 DIAGNOSIS — M25551 Pain in right hip: Secondary | ICD-10-CM

## 2024-06-19 MED ORDER — TRAMADOL HCL 50 MG PO TABS: 50.0000 mg | ORAL_TABLET | Freq: Four times a day (QID) | ORAL | 0 refills | Status: DC | PRN

## 2024-06-19 MED ORDER — DIAZEPAM 10 MG PO TABS: 10.0000 mg | ORAL_TABLET | Freq: Three times a day (TID) | ORAL | 0 refills | Status: DC

## 2024-06-19 NOTE — Telephone Encounter (Signed)
 Requesting: tramadol  and diazepam   Contract: 03/19/23 UDS: 03/19/23 Last Visit: 04/10/24 Next Visit:  None Last Refill: see med list  Please Advise

## 2024-06-19 NOTE — Telephone Encounter (Unsigned)
 Copied from CRM [id number]. Topic: Clinical - Medication Refill >> Jun 19, 2024 12:40 PM Gennette ORN wrote: Medication: traMADol  (ULTRAM ) 50 MG tablet and  diazepam  (VALIUM ) 10 MG tablet    Has the patient contacted their pharmacy? Yes (Agent: If no, request that the patient contact the pharmacy for the refill. If patient does not wish to contact the pharmacy document the reason why and proceed with request.) (Agent: If yes, when and what did the pharmacy advise?)  This is the patient's preferred pharmacy:  [id number] - [place], [place] - [address] [place] [address] Phone: [phone] Fax: [phone]  Is this the correct pharmacy for this prescription? Yes If no, delete pharmacy and type the correct one.   Has the prescription been filled recently? Yes  Is the patient out of the medication? Yes  Has the patient been seen for an appointment in the last year OR does the patient have an upcoming appointment? Yes  Can we respond through MyChart? Yes  Agent: Please be advised that Rx refills may take up to 3 business days. We ask that you follow-up with your pharmacy.

## 2024-06-21 ENCOUNTER — Other Ambulatory Visit: Payer: Self-pay | Admitting: Family Medicine

## 2024-06-21 DIAGNOSIS — E66811 Obesity, class 1: Secondary | ICD-10-CM

## 2024-06-24 ENCOUNTER — Other Ambulatory Visit (HOSPITAL_COMMUNITY): Payer: Self-pay | Admitting: Urology

## 2024-06-24 ENCOUNTER — Other Ambulatory Visit: Payer: Self-pay | Admitting: Urology

## 2024-06-24 DIAGNOSIS — C61 Malignant neoplasm of prostate: Secondary | ICD-10-CM

## 2024-06-25 ENCOUNTER — Telehealth: Payer: Self-pay | Admitting: Internal Medicine

## 2024-06-25 ENCOUNTER — Telehealth: Payer: Self-pay | Admitting: *Deleted

## 2024-06-25 ENCOUNTER — Other Ambulatory Visit: Payer: Self-pay | Admitting: Urology

## 2024-06-25 DIAGNOSIS — N400 Enlarged prostate without lower urinary tract symptoms: Secondary | ICD-10-CM | POA: Diagnosis not present

## 2024-06-25 DIAGNOSIS — C61 Malignant neoplasm of prostate: Secondary | ICD-10-CM | POA: Diagnosis not present

## 2024-06-25 DIAGNOSIS — N3289 Other specified disorders of bladder: Secondary | ICD-10-CM | POA: Diagnosis not present

## 2024-06-25 NOTE — Telephone Encounter (Signed)
 Pt has been scheduled tele preop appt 08/12/24 as surgeon's office has pt on wait list for possibly moving up surgery date from 09/18/24. Med rec and consent are done. Pt aware if surgery moved sooner than 08/12/24 to call ASAP as we will need to change his tele preop appt.

## 2024-06-25 NOTE — Telephone Encounter (Signed)
   Name: Jason Williams  DOB: 08/01/1961  MRN: [id number]  Primary Cardiologist: Vinie JAYSON Maxcy, MD   Preoperative team, please contact this patient and set up a phone call appointment for further preoperative risk assessment. Please obtain consent and complete medication review. Thank you for your help. Last seen 12/19/2023 by Rosaline Bane, NP  I confirm that guidance regarding antiplatelet and oral anticoagulation therapy has been completed and, if necessary, noted below.  Patient is not on anticoagulation or antiplatelet per review of medical record in Epic.    I also confirmed the patient resides in the state of [place] . As per [facility] Medical Board telemedicine laws, the patient must reside in the state in which the provider is licensed.   Lamarr Satterfield, NP 06/25/2024, 9:17 AM [facility]

## 2024-06-25 NOTE — Telephone Encounter (Signed)
 Pt has been scheduled tele preop appt 08/12/24 as surgeon's office has pt on wait list for possibly moving up surgery date from 09/18/24. Med rec and consent are done. Pt aware if surgery moved sooner than 08/12/24 to call ASAP as we will need to change his tele preop appt.     Patient Consent for Virtual Visit        Jason Williams has provided verbal consent on 06/25/2024 for a virtual visit (video or telephone).   CONSENT FOR VIRTUAL VISIT FOR:  Jason Williams  By participating in this virtual visit I agree to the following:  I hereby voluntarily request, consent and authorize [facility] and its employed or contracted physicians, physician assistants, nurse practitioners or other licensed health care professionals (the Practitioner), to provide me with telemedicine health care services (the "Services) as deemed necessary by the treating Practitioner. I acknowledge and consent to receive the Services by the Practitioner via telemedicine. I understand that the telemedicine visit will involve communicating with the Practitioner through live audiovisual communication technology and the disclosure of certain medical information by electronic transmission. I acknowledge that I have been given the opportunity to request an in-person assessment or other available alternative prior to the telemedicine visit and am voluntarily participating in the telemedicine visit.  I understand that I have the right to withhold or withdraw my consent to the use of telemedicine in the course of my care at any time, without affecting my right to future care or treatment, and that the Practitioner or I may terminate the telemedicine visit at any time. I understand that I have the right to inspect all information obtained and/or recorded in the course of the telemedicine visit and may receive copies of available information for a reasonable fee.  I understand that some of the potential risks of receiving the  Services via telemedicine include:  Delay or interruption in medical evaluation due to technological equipment failure or disruption; Information transmitted may not be sufficient (e.g. poor resolution of images) to allow for appropriate medical decision making by the Practitioner; and/or  In rare instances, security protocols could fail, causing a breach of personal health information.  Furthermore, I acknowledge that it is my responsibility to provide information about my medical history, conditions and care that is complete and accurate to the best of my ability. I acknowledge that Practitioner's advice, recommendations, and/or decision may be based on factors not within their control, such as incomplete or inaccurate data provided by me or distortions of diagnostic images or specimens that may result from electronic transmissions. I understand that the practice of medicine is not an exact science and that Practitioner makes no warranties or guarantees regarding treatment outcomes. I acknowledge that a copy of this consent can be made available to me via my patient portal [facility] MyChart), or I can request a printed copy by calling the office of [facility].    I understand that my insurance will be billed for this visit.   I have read or had this consent read to me. I understand the contents of this consent, which adequately explains the benefits and risks of the Services being provided via telemedicine.  I have been provided ample opportunity to ask questions regarding this consent and the Services and have had my questions answered to my satisfaction. I give my informed consent for the services to be provided through the use of telemedicine in my medical care

## 2024-06-25 NOTE — Telephone Encounter (Signed)
   Pre-operative Risk Assessment    Patient Name: Jason Williams  DOB: [birth year] MRN: [id number]      Request for Surgical Clearance    Procedure:  Robotic Prostatectomy  Date of Surgery:  Clearance 09/18/24                                 Surgeon:  Dr. Steffan Pea Surgeon's Group or Practice Name:  [facility] Phone number:  [phone] Fax number:  [phone]   Type of Clearance Requested:   - Medical    Type of Anesthesia:  General    Additional requests/questions:    SignedRojelio Kays   06/25/2024, 8:55 AM

## 2024-07-03 ENCOUNTER — Ambulatory Visit (HOSPITAL_COMMUNITY)
Admission: RE | Admit: 2024-07-03 | Discharge: 2024-07-03 | Disposition: A | Discharge status: Home / Self Care | Source: Ambulatory Visit | Attending: Urology | Admitting: Urology

## 2024-07-03 DIAGNOSIS — G8929 Other chronic pain: Secondary | ICD-10-CM | POA: Diagnosis not present

## 2024-07-03 DIAGNOSIS — C61 Malignant neoplasm of prostate: Secondary | ICD-10-CM | POA: Diagnosis not present

## 2024-07-03 DIAGNOSIS — M545 Low back pain, unspecified: Secondary | ICD-10-CM | POA: Diagnosis not present

## 2024-07-03 MED ORDER — TECHNETIUM TC 99M MEDRONATE IV KIT
21.1000 | PACK | Freq: Once | INTRAVENOUS | Status: AC
  Administered 2024-07-03: 21.1 via INTRAVENOUS

## 2024-07-08 ENCOUNTER — Other Ambulatory Visit: Payer: Self-pay | Admitting: Nurse Practitioner

## 2024-07-08 DIAGNOSIS — I2510 Atherosclerotic heart disease of native coronary artery without angina pectoris: Secondary | ICD-10-CM

## 2024-07-08 DIAGNOSIS — E785 Hyperlipidemia, unspecified: Secondary | ICD-10-CM

## 2024-07-18 ENCOUNTER — Other Ambulatory Visit: Payer: Self-pay | Admitting: Family Medicine

## 2024-07-18 ENCOUNTER — Ambulatory Visit (INDEPENDENT_AMBULATORY_CARE_PROVIDER_SITE_OTHER): Admitting: Family Medicine

## 2024-07-18 VITALS — BP 118/72 | HR 78 | Temp 97.9°F | Resp 18 | Ht 70.0 in | Wt 180.8 lb

## 2024-07-18 DIAGNOSIS — M25551 Pain in right hip: Secondary | ICD-10-CM | POA: Diagnosis not present

## 2024-07-18 DIAGNOSIS — H9203 Otalgia, bilateral: Secondary | ICD-10-CM

## 2024-07-18 DIAGNOSIS — Z0000 Encounter for general adult medical examination without abnormal findings: Secondary | ICD-10-CM

## 2024-07-18 DIAGNOSIS — F419 Anxiety disorder, unspecified: Secondary | ICD-10-CM

## 2024-07-18 DIAGNOSIS — M25552 Pain in left hip: Secondary | ICD-10-CM

## 2024-07-18 MED ORDER — TRAMADOL HCL 50 MG PO TABS: 50.0000 mg | ORAL_TABLET | Freq: Four times a day (QID) | ORAL | 0 refills | Status: DC | PRN

## 2024-07-18 MED ORDER — DIAZEPAM 10 MG PO TABS: 10.0000 mg | ORAL_TABLET | Freq: Three times a day (TID) | ORAL | 1 refills | Status: DC

## 2024-07-18 NOTE — Assessment & Plan Note (Signed)
 Con't flonase  , consider adding astepro Add claritin or another antihistamine

## 2024-07-18 NOTE — Progress Notes (Signed)
 Subjective:    Patient ID: Jason Williams, male    DOB: [birth year], 63 y.o.   MRN: [id number]  Chief Complaint  Patient presents with   Referral    Pt requesting referral to ENT due to sinus infections and pain in both ears. Pt states having a     HPI Patient is in today for ear pain.  Discussed the use of AI scribe software for clinical note transcription with the patient, who gave verbal consent to proceed.  History of Present Illness Jason Williams is a 63 year old male who presents with intermittent sharp ear pain.  He experiences intermittent sharp pain in his ears, described as a 'lightning bolt' or 'electrical shock' sensation. Initially, the pain started in his left ear and has since occurred in his right ear as well. The episodes are random, occurring two to three times in one day or not at all for several days, but consistently happening every week for the past couple of months. The pain is brief, described as a 'zap' that is gone quickly.  He has not been taking any over-the-counter medications for this issue, except for occasional use of Flonase  nasal spray, which he uses more frequently when he feels congested before bed. He denies using antihistamines like Zyrtec or Claritin.  The onset of these symptoms was around early May, just before he visited the emergency room for unrelated low blood pressure issues. He recalls mentioning the ear pain to a nurse at that time.    Past Medical History:  Diagnosis Date   Anxiety    Arthritis    Depression    GERD (gastroesophageal reflux disease)    Headache    Heart murmur    as child   History of MRSA infection    toe on right foot   Hyperlipidemia    Hypertension    Sleep apnea    no CPAP    Past Surgical History:  Procedure Laterality Date   ankle re construction  11/27/2009   bilateral   ANTERIOR CERVICAL DECOMP/DISCECTOMY FUSION N/A 02/28/2023   Procedure: Anterior Cervical Decompression/Discectomy Fusion,  Interbody Prosthesis, Plate/Screws Cervical Three-Cervical Four, Cervical Four-Cervical Five, Cervical Five- Cervical Six;  Surgeon: Mavis Purchase, MD;  Location: [facility] OR;  Service: Neurosurgery;  Laterality: N/A;  3C   APPENDECTOMY  11/28/1979   exploratory surgery d/t appendix bursting    Family History  Problem Relation Age of Onset   Esophageal cancer Mother    Heart attack Father        Died of MI at 21   Mental illness Father    Healthy Sister    Healthy Sister    Heart disease Maternal Grandfather        MI/CABG   Heart attack Maternal Grandfather    Colon cancer Neg Hx    Rectal cancer Neg Hx    Stomach cancer Neg Hx     Social History   Socioeconomic History   Marital status: Single    Spouse name: Not on file   Number of children: 1   Years of education: Not on file   Highest education level: Associate degree: academic program  Occupational History   Occupation: Garment/textile technologist: [employer]  Tobacco Use   Smoking status: Never   Smokeless tobacco: Never  Vaping Use   Vaping status: Never Used  Substance and Sexual Activity   Alcohol use: Yes    Alcohol/week: 2.0 standard drinks of alcohol  Types: 2 Glasses of wine per week   Drug use: No   Sexual activity: Yes    Birth control/protection: Pill, None    Comment: 1 male  Other Topics Concern   Not on file  Social History Narrative   Not on file   Social Drivers of Health   Financial Resource Strain: Low Risk  (12/30/2023)   Overall Financial Resource Strain (CARDIA)    Difficulty of Paying Living Expenses: Not hard at all  Food Insecurity: No Food Insecurity (12/30/2023)   Hunger Vital Sign    Worried About Running Out of Food in the Last Year: Never true    Ran Out of Food in the Last Year: Never true  Transportation Needs: No Transportation Needs (12/30/2023)   PRAPARE - Administrator, Civil Service (Medical): No    Lack of Transportation (Non-Medical): No  Physical  Activity: Sufficiently Active (12/30/2023)   Exercise Vital Sign    Days of Exercise per Week: 4 days    Minutes of Exercise per Session: 60 min  Stress: Stress Concern Present (12/30/2023)   [employer] of Occupational Health - Occupational Stress Questionnaire    Feeling of Stress : To some extent  Social Connections: Unknown (12/30/2023)   Social Connection and Isolation Panel    Frequency of Communication with Friends and Family: Three times a week    Frequency of Social Gatherings with Friends and Family: Twice a week    Attends Religious Services: Patient declined    Database administrator or Organizations: Yes    Attends Banker Meetings: 1 to 4 times per year    Marital Status: Divorced  Catering manager Violence: Not on file    Outpatient Medications Prior to Visit  Medication Sig Dispense Refill   esomeprazole  (NEXIUM ) 40 MG capsule Take 1 capsule (40 mg total) by mouth daily. (Patient taking differently: Take 40 mg by mouth daily as needed.) 90 capsule 3   Evolocumab  (REPATHA  SURECLICK) 140 MG/ML SOAJ INJECT 140 MG INTO THE SKIN EVERY 14 (FOURTEEN) DAYS. 6 mL 1   fluticasone  (FLONASE ) 50 MCG/ACT nasal spray Place 2 sprays into both nostrils daily. (Patient taking differently: Place 2 sprays into both nostrils daily as needed.) 16 g 6   HYDROcodone -acetaminophen  (NORCO) 5-325 MG tablet Take 1-2 tablets by mouth daily as needed. 14 tablet 0   levocetirizine (XYZAL ) 5 MG tablet Take 1 tablet (5 mg total) by mouth every evening. 30 tablet 5   ondansetron  (ZOFRAN ) 4 MG tablet Take 1 tablet (4 mg total) by mouth every 8 (eight) hours as needed for nausea or vomiting. 40 tablet 0   Semaglutide -Weight Management (WEGOVY ) 2.4 MG/0.75ML SOAJ Inject 2.4 mg into the skin once a week. 3 mL 1   tadalafil  (CIALIS ) 20 MG tablet Take 1 tablet (20 mg total) by mouth as needed. 30 tablet 3   zolpidem  (AMBIEN ) 10 MG tablet TAKE 1 TABLET BY MOUTH AT BEDTIME AS NEEDED FOR SLEEP 90  tablet 0   zolpidem  (AMBIEN ) 10 MG tablet TAKE 1 TABLET BY MOUTH EVERY DAY AT BEDTIME AS NEEDED FOR SLEEP 90 tablet 0   diazepam  (VALIUM ) 10 MG tablet Take 1 tablet (10 mg total) by mouth 3 (three) times daily. 90 tablet 0   traMADol  (ULTRAM ) 50 MG tablet Take 1 tablet (50 mg total) by mouth every 6 (six) hours as needed. 90 tablet 0   amLODipine  (NORVASC ) 5 MG tablet Take 1 tablet (5 mg total) by mouth  daily. (Patient not taking: Reported on 07/18/2024) 90 tablet 1   docusate sodium  (COLACE) 100 MG capsule Take 1 capsule (100 mg total) by mouth 2 (two) times daily. (Patient not taking: Reported on 07/18/2024) 10 capsule 0   HYDROcodone -acetaminophen  (NORCO) 5-325 MG tablet Take 1 tablet by mouth 3 (three) times daily as needed. To be taken after surgery (Patient not taking: Reported on 07/18/2024) 20 tablet 0   No facility-administered medications prior to visit.    No Known Allergies  Review of Systems  Constitutional:  Negative for fever and malaise/fatigue.  HENT:  Positive for ear pain. Negative for congestion, ear discharge and sore throat.   Eyes:  Negative for blurred vision.  Respiratory:  Negative for cough and shortness of breath.   Cardiovascular:  Negative for chest pain, palpitations and leg swelling.  Gastrointestinal:  Negative for vomiting.  Musculoskeletal:  Negative for back pain.  Skin:  Negative for rash.  Neurological:  Negative for loss of consciousness and headaches.       Objective:    Physical Exam Vitals and nursing note reviewed.  Constitutional:      General: He is not in acute distress.    Appearance: Normal appearance. He is well-developed.  HENT:     Head: Normocephalic and atraumatic.     Right Ear: Tympanic membrane, ear canal and external ear normal.     Left Ear: Tympanic membrane, ear canal and external ear normal.     Nose: Nose normal.  Eyes:     General: No scleral icterus.       Right eye: No discharge.        Left eye: No discharge.   Cardiovascular:     Rate and Rhythm: Normal rate and regular rhythm.     Heart sounds: No murmur heard. Pulmonary:     Effort: Pulmonary effort is normal. No respiratory distress.     Breath sounds: Normal breath sounds.  Musculoskeletal:        General: Normal range of motion.     Cervical back: Normal range of motion and neck supple.     Right lower leg: No edema.     Left lower leg: No edema.  Skin:    General: Skin is warm and dry.  Neurological:     Mental Status: He is alert and oriented to person, place, and time.  Psychiatric:        Mood and Affect: Mood normal.        Behavior: Behavior normal.        Thought Content: Thought content normal.        Judgment: Judgment normal.     BP 118/72 (BP Location: Left Arm, Patient Position: Sitting, Cuff Size: Normal)   Pulse 78   Temp 97.9 F (36.6 C) (Oral)   Resp 18   Ht 5' 10 (1.778 m)   Wt 180 lb 12.8 oz (82 kg)   SpO2 99%   BMI 25.94 kg/m  Wt Readings from Last 3 Encounters:  07/18/24 180 lb 12.8 oz (82 kg)  04/10/24 186 lb 6.4 oz (84.6 kg)  04/08/24 185 lb (83.9 kg)    Diabetic Foot Exam - Simple   No data filed    Lab Results  Component Value Date   WBC 6.3 04/10/2024   HGB 14.6 04/10/2024   HCT 43.0 04/10/2024   PLT 259.0 04/10/2024   GLUCOSE 91 04/10/2024   CHOL 108 04/10/2024   TRIG 86.0 04/10/2024   HDL 46.90  04/10/2024   LDLCALC 44 04/10/2024   ALT 20 04/10/2024   AST 14 04/10/2024   NA 139 04/10/2024   K 4.4 04/10/2024   CL 104 04/10/2024   CREATININE 1.07 04/10/2024   BUN 13 04/10/2024   CO2 26 04/10/2024   TSH 3.21 04/10/2024   PSA 4.87 (H) 01/01/2024   HGBA1C 5.4 04/10/2024    Lab Results  Component Value Date   TSH 3.21 04/10/2024   Lab Results  Component Value Date   WBC 6.3 04/10/2024   HGB 14.6 04/10/2024   HCT 43.0 04/10/2024   MCV 90.1 04/10/2024   PLT 259.0 04/10/2024   Lab Results  Component Value Date   NA 139 04/10/2024   K 4.4 04/10/2024   CO2 26  04/10/2024   GLUCOSE 91 04/10/2024   BUN 13 04/10/2024   CREATININE 1.07 04/10/2024   BILITOT 0.5 04/10/2024   ALKPHOS 58 04/10/2024   AST 14 04/10/2024   ALT 20 04/10/2024   PROT 6.8 04/10/2024   ALBUMIN 4.4 04/10/2024   CALCIUM  9.4 04/10/2024   ANIONGAP 9 04/08/2024   EGFR 65 12/14/2023   GFR 74.18 04/10/2024   Lab Results  Component Value Date   CHOL 108 04/10/2024   Lab Results  Component Value Date   HDL 46.90 04/10/2024   Lab Results  Component Value Date   LDLCALC 44 04/10/2024   Lab Results  Component Value Date   TRIG 86.0 04/10/2024   Lab Results  Component Value Date   CHOLHDL 2 04/10/2024   Lab Results  Component Value Date   HGBA1C 5.4 04/10/2024       Assessment & Plan:  Ear pain, bilateral Assessment & Plan: Con't flonase  , consider adding astepro Add claritin or another antihistamine    Preventative health care -     diazePAM ; Take 1 tablet (10 mg total) by mouth 3 (three) times daily.  Dispense: 90 tablet; Refill: 1 -     traMADol  HCl; Take 1 tablet (50 mg total) by mouth every 6 (six) hours as needed.  Dispense: 90 tablet; Refill: 0  Anxiety -     diazePAM ; Take 1 tablet (10 mg total) by mouth 3 (three) times daily.  Dispense: 90 tablet; Refill: 1  Bilateral hip pain -     traMADol  HCl; Take 1 tablet (50 mg total) by mouth every 6 (six) hours as needed.  Dispense: 90 tablet; Refill: 0  Assessment and Plan Assessment & Plan Recurrent bilateral otalgia and possible Eustachian tube dysfunction with serous otitis media   He experiences intermittent sharp pain in both ears, initially starting in the left ear and now occurring in the right ear. Symptoms have persisted for a couple of months, with episodes occurring weekly. Possible Eustachian tube dysfunction with serous otitis media is considered due to fluid in the ear. The differential diagnosis includes nerve-related issues due to the pain described as a 'zap'. Refer to ENT for further  evaluation. Recommend adding an over-the-counter antihistamine such as Claritin or Zyrtec. Suggest using Astepro, an antihistamine nasal spray, in conjunction with Flonase .  Chronic sinusitis   He has chronic sinusitis with symptoms of nasal congestion, which may be contributing to ear issues. Continue using Flonase  nasal spray. Recommend adding an over-the-counter antihistamine such as Claritin or Zyrtec. Suggest using Astepro, an antihistamine nasal spray, in conjunction with Flonase .     Ranesha Val R Lowne Chase, DO

## 2024-07-18 NOTE — Progress Notes (Unsigned)
 Subjective:    Patient ID: Jason Williams, male    DOB: January 17, 1961, 63 y.o.   MRN: [id number]  No chief complaint on file.   HPI Patient is in today for ear pain.   Discussed the use of AI scribe software for clinical note transcription with the patient, who gave verbal consent to proceed.  History of Present Illness     Past Medical History:  Diagnosis Date   Anxiety    Arthritis    Depression    GERD (gastroesophageal reflux disease)    Headache    Heart murmur    as child   History of MRSA infection    toe on right foot   Hyperlipidemia    Hypertension    Sleep apnea    no CPAP    Past Surgical History:  Procedure Laterality Date   ankle re construction  11/27/2009   bilateral   ANTERIOR CERVICAL DECOMP/DISCECTOMY FUSION N/A 02/28/2023   Procedure: Anterior Cervical Decompression/Discectomy Fusion, Interbody Prosthesis, Plate/Screws Cervical Three-Cervical Four, Cervical Four-Cervical Five, Cervical Five- Cervical Six;  Surgeon: Mavis Purchase, MD;  Location: [facility] OR;  Service: Neurosurgery;  Laterality: N/A;  3C   APPENDECTOMY  11/28/1979   exploratory surgery d/t appendix bursting    Family History  Problem Relation Age of Onset   Esophageal cancer Mother    Heart attack Father        Died of MI at 89   Mental illness Father    Healthy Sister    Healthy Sister    Heart disease Maternal Grandfather        MI/CABG   Heart attack Maternal Grandfather    Colon cancer Neg Hx    Rectal cancer Neg Hx    Stomach cancer Neg Hx     Social History   Socioeconomic History   Marital status: Single    Spouse name: Not on file   Number of children: 1   Years of education: Not on file   Highest education level: Associate degree: academic program  Occupational History   Occupation: Garment/textile technologist: [employer]  Tobacco Use   Smoking status: Never   Smokeless tobacco: Never  Vaping Use   Vaping status: Never Used  Substance and Sexual Activity    Alcohol use: Yes    Alcohol/week: 2.0 standard drinks of alcohol    Types: 2 Glasses of wine per week   Drug use: No   Sexual activity: Yes    Birth control/protection: Pill, None    Comment: 1 male  Other Topics Concern   Not on file  Social History Narrative   Not on file   Social Drivers of Health   Financial Resource Strain: Low Risk  (12/30/2023)   Overall Financial Resource Strain (CARDIA)    Difficulty of Paying Living Expenses: Not hard at all  Food Insecurity: No Food Insecurity (12/30/2023)   Hunger Vital Sign    Worried About Running Out of Food in the Last Year: Never true    Ran Out of Food in the Last Year: Never true  Transportation Needs: No Transportation Needs (12/30/2023)   PRAPARE - Administrator, Civil Service (Medical): No    Lack of Transportation (Non-Medical): No  Physical Activity: Sufficiently Active (12/30/2023)   Exercise Vital Sign    Days of Exercise per Week: 4 days    Minutes of Exercise per Session: 60 min  Stress: Stress Concern Present (12/30/2023)   [employer]  of Occupational Health - Occupational Stress Questionnaire    Feeling of Stress : To some extent  Social Connections: Unknown (12/30/2023)   Social Connection and Isolation Panel    Frequency of Communication with Friends and Family: Three times a week    Frequency of Social Gatherings with Friends and Family: Twice a week    Attends Religious Services: Patient declined    Database administrator or Organizations: Yes    Attends Banker Meetings: 1 to 4 times per year    Marital Status: Divorced  Catering manager Violence: Not on file    Outpatient Medications Prior to Visit  Medication Sig Dispense Refill   diazepam  (VALIUM ) 10 MG tablet Take 1 tablet (10 mg total) by mouth 3 (three) times daily. 90 tablet 1   esomeprazole  (NEXIUM ) 40 MG capsule Take 1 capsule (40 mg total) by mouth daily. (Patient taking differently: Take 40 mg by mouth daily as needed.)  90 capsule 3   Evolocumab  (REPATHA  SURECLICK) 140 MG/ML SOAJ INJECT 140 MG INTO THE SKIN EVERY 14 (FOURTEEN) DAYS. 6 mL 1   fluticasone  (FLONASE ) 50 MCG/ACT nasal spray Place 2 sprays into both nostrils daily. (Patient taking differently: Place 2 sprays into both nostrils daily as needed.) 16 g 6   HYDROcodone -acetaminophen  (NORCO) 5-325 MG tablet Take 1-2 tablets by mouth daily as needed. 14 tablet 0   levocetirizine (XYZAL ) 5 MG tablet Take 1 tablet (5 mg total) by mouth every evening. 30 tablet 5   ondansetron  (ZOFRAN ) 4 MG tablet Take 1 tablet (4 mg total) by mouth every 8 (eight) hours as needed for nausea or vomiting. 40 tablet 0   Semaglutide -Weight Management (WEGOVY ) 2.4 MG/0.75ML SOAJ Inject 2.4 mg into the skin once a week. 3 mL 1   tadalafil  (CIALIS ) 20 MG tablet Take 1 tablet (20 mg total) by mouth as needed. 30 tablet 3   traMADol  (ULTRAM ) 50 MG tablet Take 1 tablet (50 mg total) by mouth every 6 (six) hours as needed. 90 tablet 0   zolpidem  (AMBIEN ) 10 MG tablet TAKE 1 TABLET BY MOUTH AT BEDTIME AS NEEDED FOR SLEEP 90 tablet 0   zolpidem  (AMBIEN ) 10 MG tablet TAKE 1 TABLET BY MOUTH EVERY DAY AT BEDTIME AS NEEDED FOR SLEEP 90 tablet 0   No facility-administered medications prior to visit.    No Known Allergies  ROS     Objective:    Physical Exam  There were no vitals taken for this visit. Wt Readings from Last 3 Encounters:  07/18/24 180 lb 12.8 oz (82 kg)  04/10/24 186 lb 6.4 oz (84.6 kg)  04/08/24 185 lb (83.9 kg)    Diabetic Foot Exam - Simple   No data filed    Lab Results  Component Value Date   WBC 6.3 04/10/2024   HGB 14.6 04/10/2024   HCT 43.0 04/10/2024   PLT 259.0 04/10/2024   GLUCOSE 91 04/10/2024   CHOL 108 04/10/2024   TRIG 86.0 04/10/2024   HDL 46.90 04/10/2024   LDLCALC 44 04/10/2024   ALT 20 04/10/2024   AST 14 04/10/2024   NA 139 04/10/2024   K 4.4 04/10/2024   CL 104 04/10/2024   CREATININE 1.07 04/10/2024   BUN 13 04/10/2024   CO2  26 04/10/2024   TSH 3.21 04/10/2024   PSA 4.87 (H) 01/01/2024   HGBA1C 5.4 04/10/2024    Lab Results  Component Value Date   TSH 3.21 04/10/2024   Lab Results  Component  Value Date   WBC 6.3 04/10/2024   HGB 14.6 04/10/2024   HCT 43.0 04/10/2024   MCV 90.1 04/10/2024   PLT 259.0 04/10/2024   Lab Results  Component Value Date   NA 139 04/10/2024   K 4.4 04/10/2024   CO2 26 04/10/2024   GLUCOSE 91 04/10/2024   BUN 13 04/10/2024   CREATININE 1.07 04/10/2024   BILITOT 0.5 04/10/2024   ALKPHOS 58 04/10/2024   AST 14 04/10/2024   ALT 20 04/10/2024   PROT 6.8 04/10/2024   ALBUMIN 4.4 04/10/2024   CALCIUM  9.4 04/10/2024   ANIONGAP 9 04/08/2024   EGFR 65 12/14/2023   GFR 74.18 04/10/2024   Lab Results  Component Value Date   CHOL 108 04/10/2024   Lab Results  Component Value Date   HDL 46.90 04/10/2024   Lab Results  Component Value Date   LDLCALC 44 04/10/2024   Lab Results  Component Value Date   TRIG 86.0 04/10/2024   Lab Results  Component Value Date   CHOLHDL 2 04/10/2024   Lab Results  Component Value Date   HGBA1C 5.4 04/10/2024       Assessment & Plan:  Otalgia of both ears -     Ambulatory referral to ENT    Jamee JONELLE Antonio Cyndee, DO

## 2024-08-12 ENCOUNTER — Ambulatory Visit: Attending: Cardiology

## 2024-08-12 DIAGNOSIS — Z01810 Encounter for preprocedural cardiovascular examination: Secondary | ICD-10-CM | POA: Diagnosis not present

## 2024-08-12 NOTE — Progress Notes (Signed)
 Virtual Visit via Telephone Note   Because of Jason Williams co-morbid illnesses, he is at least at moderate risk for complications without adequate follow up.  This format is felt to be most appropriate for this patient at this time.  Due to technical limitations with video connection (technology), today's appointment will be conducted as an audio only telehealth visit, and Jason Williams verbally agreed to proceed in this manner.   All issues noted in this document were discussed and addressed.  No physical exam could be performed with this format.  Evaluation Performed:  Preoperative cardiovascular risk assessment _____________   Date:  08/12/2024   Patient ID:  Jason Williams, DOB 04/11/1961, MRN [id number] Patient Location:  Home Provider location:   Office  Primary Care Provider:  Antonio Meth, Jamee SAUNDERS, DO Primary Cardiologist:  Vinie JAYSON Maxcy, MD  Chief Complaint / Patient Profile  63 y.o. y/o male with a h/o nonobstructive coronary artery disease per coronary CTA in 2022, hyperlipidemia, carotid artery disease, hypertension, GERD, sleep apnea who is pending robotic prostatectomy with Dr. Shane and presents today for telephonic preoperative cardiovascular risk assessment. History of Present Illness  Jason Williams is a 63 y.o. male who presents via audio/video conferencing for a telehealth visit today.  Pt was last seen in cardiology clinic on 12/19/2023 by Rosaline Bane, NP.  At that time Jason Williams was doing well.  The patient is now pending procedure as outlined above. Since his last visit, he has remained stable from a cardiac standpoint. Today he denies chest pain, shortness of breath, lower extremity edema, fatigue, palpitations, melena, hematuria, hemoptysis, diaphoresis, weakness, presyncope, syncope, orthopnea, and PND.  Patient is able to achieve greater than 4 METS of activity, he regularly attends the gym, golfs and weight lifts. Past Medical History     Past Medical History:  Diagnosis Date   Anxiety    Arthritis    Depression    GERD (gastroesophageal reflux disease)    Headache    Heart murmur    as child   History of MRSA infection    toe on right foot   Hyperlipidemia    Hypertension    Sleep apnea    no CPAP   Past Surgical History:  Procedure Laterality Date   ankle re construction  11/27/2009   bilateral   ANTERIOR CERVICAL DECOMP/DISCECTOMY FUSION N/A 02/28/2023   Procedure: Anterior Cervical Decompression/Discectomy Fusion, Interbody Prosthesis, Plate/Screws Cervical Three-Cervical Four, Cervical Four-Cervical Five, Cervical Five- Cervical Six;  Surgeon: Mavis Purchase, MD;  Location: [facility];  Service: Neurosurgery;  Laterality: N/A;  3C   APPENDECTOMY  11/28/1979   exploratory surgery d/t appendix bursting    Allergies  No Known Allergies  Home Medications    Prior to Admission medications   Medication Sig Start Date End Date Taking? Authorizing Provider  diazepam  (VALIUM ) 10 MG tablet Take 1 tablet (10 mg total) by mouth 3 (three) times daily. 07/18/24   Antonio Meth Jamee SAUNDERS, DO  esomeprazole  (NEXIUM ) 40 MG capsule Take 1 capsule (40 mg total) by mouth daily. Patient taking differently: Take 40 mg by mouth daily as needed. 05/15/24   Antonio Meth Jamee R, DO  Evolocumab  (REPATHA  SURECLICK) 140 MG/ML SOAJ INJECT 140 MG INTO THE SKIN EVERY 14 (FOURTEEN) DAYS. 07/09/24   Swinyer, Rosaline HERO, NP  fluticasone  (FLONASE ) 50 MCG/ACT nasal spray Place 2 sprays into both nostrils daily. Patient taking differently: Place 2 sprays into both nostrils daily as needed. 01/29/24  Antonio Meth, Yvonne R, DO  HYDROcodone -acetaminophen  (NORCO) 5-325 MG tablet Take 1-2 tablets by mouth daily as needed. 01/03/24   Jule Ronal CROME, PA-C  levocetirizine (XYZAL ) 5 MG tablet Take 1 tablet (5 mg total) by mouth every evening. 01/31/24   Antonio Meth Jamee JONELLE, DO  ondansetron  (ZOFRAN ) 4 MG tablet Take 1 tablet (4 mg total) by mouth every 8  (eight) hours as needed for nausea or vomiting. 12/18/23   Jule Ronal CROME, PA-C  Semaglutide -Weight Management (WEGOVY ) 2.4 MG/0.75ML SOAJ Inject 2.4 mg into the skin once a week. 06/23/24   Antonio Meth Jamee JONELLE, DO  tadalafil  (CIALIS ) 20 MG tablet Take 1 tablet (20 mg total) by mouth as needed. 03/27/24   Antonio Meth Jamee JONELLE, DO  traMADol  (ULTRAM ) 50 MG tablet Take 1 tablet (50 mg total) by mouth every 6 (six) hours as needed. 07/18/24   Antonio Meth Jamee R, DO  zolpidem  (AMBIEN ) 10 MG tablet TAKE 1 TABLET BY MOUTH AT BEDTIME AS NEEDED FOR SLEEP 03/05/24   Antonio Meth, Jamee R, DO  zolpidem  (AMBIEN ) 10 MG tablet TAKE 1 TABLET BY MOUTH EVERY DAY AT BEDTIME AS NEEDED FOR SLEEP 03/05/24   Antonio Meth Jamee JONELLE, DO   Physical Exam  Vital Signs:  Jason Williams does not have vital signs available for review today. Given telephonic nature of communication, physical exam is limited. AAOx3. NAD. Normal affect.  Speech and respirations are unlabored. Accessory Clinical Findings  None Assessment & Plan    1.  Preoperative Cardiovascular Risk Assessment: Jason Williams perioperative risk of a major cardiac event is 0.4% according to the Revised Cardiac Risk Index (RCRI).  Therefore, he is at low risk for perioperative complications.   His functional capacity is excellent at 9.89 METs according to the Duke Activity Status Index (DASI). Recommendations: According to ACC/AHA guidelines, no further cardiovascular testing needed.  The patient may proceed to surgery at acceptable risk.   Antiplatelet and/or Anticoagulation Recommendations: None requested   The patient was advised that if he develops new symptoms prior to surgery to contact our office to arrange for a follow-up visit, and he verbalized understanding.  A copy of this note will be routed to requesting surgeon.  Time:   Today, I have spent 10 minutes with the patient with telehealth technology discussing medical history, symptoms, and  management plan.    Quynn Vilchis D Shallyn Constancio, NP  08/12/2024, 12:15 PM

## 2024-08-14 ENCOUNTER — Other Ambulatory Visit: Payer: Self-pay | Admitting: Family Medicine

## 2024-08-14 DIAGNOSIS — E66811 Obesity, class 1: Secondary | ICD-10-CM

## 2024-08-21 ENCOUNTER — Other Ambulatory Visit: Payer: Self-pay | Admitting: Family Medicine

## 2024-08-21 DIAGNOSIS — Z0000 Encounter for general adult medical examination without abnormal findings: Secondary | ICD-10-CM

## 2024-08-21 DIAGNOSIS — M25551 Pain in right hip: Secondary | ICD-10-CM

## 2024-08-21 NOTE — Telephone Encounter (Signed)
 Requesting: tramadol  50mg   Contract: 03/19/23 UDS: 03/19/23 Last Visit: 07/18/24 Next Visit: None Last Refill: 07/18/24 #90 and 0RF   Please Advise

## 2024-09-01 ENCOUNTER — Other Ambulatory Visit: Payer: Self-pay | Admitting: Family Medicine

## 2024-09-01 DIAGNOSIS — G4700 Insomnia, unspecified: Secondary | ICD-10-CM

## 2024-09-01 MED ORDER — ZOLPIDEM TARTRATE 10 MG PO TABS: ORAL_TABLET | ORAL | 0 refills | Status: AC

## 2024-09-01 NOTE — Telephone Encounter (Signed)
 Copied from CRM [id number]. Topic: Clinical - Medication Refill >> Sep 01, 2024  9:29 AM Jason Williams wrote: Medication: zolpidem  (AMBIEN ) 10 MG tablet  Has the patient contacted their pharmacy? Yes (Agent: If no, request that the patient contact the pharmacy for the refill. If patient does not wish to contact the pharmacy document the reason why and proceed with request.) (Agent: If yes, when and what did the pharmacy advise?)  This is the patient's preferred pharmacy:  [id number] - [place], [place] - [address]OSS [address] [place] [address] Phone: [phone] Fax: [phone]  Is this the correct pharmacy for this prescription? Yes If no, delete pharmacy and type the correct one.   Has the prescription been filled recently? No  Is the patient out of the medication? Yes  Has the patient been seen for an appointment in the last year OR does the patient have an upcoming appointment? Yes  Can we respond through MyChart? Yes  Agent: Please be advised that Rx refills may take up to 3 business days. We ask that you follow-up with your pharmacy.

## 2024-09-01 NOTE — Telephone Encounter (Signed)
 Requesting: Ambien  10mg  Contract:03/19/23 UDS: 03/19/23 Last Visit: 07/18/24 Next Visit: None Last Refill: 03/05/24 #90 and 0RF   Please Advise

## 2024-09-01 NOTE — Telephone Encounter (Signed)
 Duplicate request

## 2024-09-03 NOTE — Patient Instructions (Signed)
 SURGICAL WAITING ROOM VISITATION Patients having surgery or a procedure may have no more than 2 support people in the waiting area - these visitors may rotate in the visitor waiting room.   If the patient needs to stay at the hospital during part of their recovery, the visitor guidelines for inpatient rooms apply.  PRE-OP VISITATION  Pre-op nurse will coordinate an appropriate time for 1 support person to accompany the patient in pre-op.  This support person may not rotate.  This visitor will be contacted when the time is appropriate for the visitor to come back in the pre-op area.  Please refer to the [facility] website for the visitor guidelines for Inpatients (after your surgery is over and you are in a regular room).  You are not required to quarantine at this time prior to your surgery. However, you must do this: Hand Hygiene often Do NOT share personal items Notify your provider if you are in close contact with someone who has COVID or you develop fever 100.4 or greater, new onset of sneezing, cough, sore throat, shortness of breath or body aches.  If you test positive for Covid or have been in contact with anyone that has tested positive in the last 10 days please notify you surgeon.    Your procedure is scheduled on:  Thursday  September 18, 2024  Report to [facility] Main Entrance: [address] entrance where the [employer] is available.   Report to admitting at: 06:15    AM  Call this number if you have any questions or problems the morning of surgery [phone]  DO NOT EAT OR DRINK ANYTHING AFTER MIDNIGHT THE NIGHT PRIOR TO YOUR SURGERY / PROCEDURE.   FOLLOW  ANY ADDITIONAL PRE OP INSTRUCTIONS YOU RECEIVED FROM YOUR SURGEON'S OFFICE!!!  MAGNESIUM  CITRATE:  Obtain one (1)  bottle (8 oz) of Magnesium  Citrate at your pharmacy. Drink entire bottle at 12:00 noon the day before your surgery/ procedure.  FLEET ENEMA: Obtain one(1) Fleet Enema (sodium phosphate  7-19 gm / 118  ml enema) and use (according to the directions on the box) the night prior to your surgery.   If you have any questions, please contact your Surgeon's office for additional information.    Oral Hygiene is also important to reduce your risk of infection.        Remember - BRUSH YOUR TEETH THE MORNING OF SURGERY WITH YOUR REGULAR TOOTHPASTE  Do NOT smoke after Midnight the night before surgery.  STOP TAKING all Vitamins, Herbs and supplements 1 week before your surgery.   WEGOVY  - do not inject within 10-14 days of your surgery. Last injection will be on 09-04-24  Take ONLY these medicines the morning of surgery with A SIP OF WATER: Finasteride, Diazepam  and Tramadol  if needed for pain. You may use your Flonase  nasal spray if needed.    You may not have any metal on your body including  jewelry, and body piercing  Do not wear lotions, powders, cologne, or deodorant  Men may shave face and neck.  Contacts, Hearing Aids, dentures or bridgework may not be worn into surgery. DENTURES WILL BE REMOVED PRIOR TO SURGERY PLEASE DO NOT APPLY Poly grip OR ADHESIVES!!!  You may bring a small overnight bag with you on the day of surgery, only pack items that are not valuable. [facility] IS NOT RESPONSIBLE   FOR VALUABLES THAT ARE LOST OR STOLEN.   Do not bring your home medications to the hospital. The Pharmacy  will dispense medications listed on your medication list to you during your admission in the Hospital.  Please read over the following fact sheets you were given: IF YOU HAVE QUESTIONS ABOUT YOUR PRE-OP INSTRUCTIONS, PLEASE CALL [phone].    [place] - Preparing for Surgery      Before surgery, you can play an important role.  Because skin is not sterile, your skin needs to be as free of germs as possible.  You can reduce the number of germs on your skin by washing with CHG (chlorahexidine gluconate) soap before surgery.  CHG is an antiseptic cleaner which kills germs and bonds with  the skin to continue killing germs even after washing. Please DO NOT use if you have an allergy to CHG or antibacterial soaps.  If your skin becomes reddened/irritated stop using the CHG and inform your nurse when you arrive at Short Stay. Do not shave (including legs and underarms) for at least 48 hours prior to the first CHG shower.  You may shave your face/neck.  Please follow these instructions carefully:  1.  Shower with CHG Soap the night before surgery ONLY (DO NOT USE THE CHG SOAP THE MORNING OF SURGERY).  2.  If you choose to wash your hair, wash your hair first as usual with your normal  shampoo.  3.  After you shampoo, rinse your hair and body thoroughly to remove the shampoo.                             4.  Use CHG as you would any other liquid soap.  You can apply chg directly to the skin and wash.  Gently with a scrungie or clean washcloth.  5.  Apply the CHG Soap to your body ONLY FROM THE NECK DOWN.   Do not use on face/ open                           Wound or open sores. Avoid contact with eyes, ears mouth and genitals (private parts).                       Wash face,  Genitals (private parts) with your normal soap.             6.  Wash thoroughly, paying special attention to the area where your  surgery  will be performed.  7.  Thoroughly rinse your body with warm water from the neck down.  8.  DO NOT shower/wash with your normal soap after using and rinsing off the CHG Soap.                9.  Pat yourself dry with a clean towel.            10.  Wear clean pajamas.            11.  Place clean sheets on your bed the night of your first shower and do not  sleep with pets.  Day of Surgery : Do not apply any CHG, lotions/deodorants the morning of surgery.  Please wear clean clothes to the hospital/surgery center.   FAILURE TO FOLLOW THESE INSTRUCTIONS MAY RESULT IN THE CANCELLATION OF YOUR SURGERY  PATIENT SIGNATURE_________________________________  NURSE  SIGNATURE__________________________________  ________________________________________________________________________

## 2024-09-03 NOTE — Progress Notes (Incomplete)
 COVID Vaccine received:  []  No [x]  Yes Date of any COVID positive Test in last 90 days:  PCP - Jamee Antonio Meth, DO  Cardiologist - K. [place] Hilty, MD, Katlyn West, NP,  cardiac clearance in 08-12-24 note  Chest x-ray -  EKG -  04-08-2024  Epic Stress Test - 08-05-2015  Epic ECHO - 08-05-2015  Epic Cardiac Cath -  CT Coronary Calcium  score: 387 on 08-23-2021  Epic  Bowel Prep - []  No  [x]   Yes _Fleet / Mag citrate  Pacemaker / ICD device [x]  No []  Yes   Spinal Cord Stimulator:[x]  No []  Yes       History of Sleep Apnea? []  No [x]  Yes   CPAP used?- [x]  No []  Yes    Patient has: [x]  NO Hx DM   []  Pre-DM   []  DM1  []   DM2 Does the patient monitor blood sugar?   [x]  N/A   []  No []  Yes  Last A1c was:  5.4 normal on  04-10-24  Wegovy  - Weight Loss-  last injection: Thursday 09-04-24      Blood Thinner / Instructions:  none Aspirin Instructions:  none  Dental hx: []  Dentures:  []  N/A      []  Bridge or Partial:                   []  Loose or Damaged teeth, Poor dentition.   Comments:   Activity level: Able to walk up 2 flights of stairs without becoming significantly short of breath or having chest pain?  []  No   []    Yes  Patient can perform ADLs without assistance. []  No   []   Yes  Anesthesia review: Nonobstructive CAD, HTN- no meds, Murmur - as a child, ACDF C3-4, C4-5, C5-6 in 02-2023, GERD, OSA- No CPAP, CPS- long term opiates. Hx of MRSA infection in foot.   Patient denies any S&S of respiratory illness or Covid - no shortness of breath, fever, cough or chest pain at PAT appointment.  Patient verbalized understanding and agreement to the Pre-Surgical Instructions that were given to them at this PAT appointment. Patient was also educated of the need to review these PAT instructions again prior to his surgery.I reviewed the appropriate phone numbers to call if they have any and questions or concerns.

## 2024-09-04 ENCOUNTER — Other Ambulatory Visit: Payer: Self-pay

## 2024-09-04 ENCOUNTER — Encounter (HOSPITAL_COMMUNITY)
Admission: RE | Admit: 2024-09-04 | Discharge: 2024-09-04 | Disposition: A | Discharge status: Home / Self Care | Source: Ambulatory Visit | Attending: Urology | Admitting: Urology

## 2024-09-04 ENCOUNTER — Encounter (HOSPITAL_COMMUNITY): Payer: Self-pay

## 2024-09-04 VITALS — BP 116/82 | HR 72 | Temp 98.0°F | Resp 16 | Ht 70.0 in | Wt 180.0 lb

## 2024-09-04 DIAGNOSIS — Z79891 Long term (current) use of opiate analgesic: Secondary | ICD-10-CM | POA: Insufficient documentation

## 2024-09-04 DIAGNOSIS — Z79899 Other long term (current) drug therapy: Secondary | ICD-10-CM | POA: Diagnosis not present

## 2024-09-04 DIAGNOSIS — I2510 Atherosclerotic heart disease of native coronary artery without angina pectoris: Secondary | ICD-10-CM | POA: Diagnosis not present

## 2024-09-04 DIAGNOSIS — Z01818 Encounter for other preprocedural examination: Secondary | ICD-10-CM | POA: Diagnosis not present

## 2024-09-04 DIAGNOSIS — G4730 Sleep apnea, unspecified: Secondary | ICD-10-CM | POA: Insufficient documentation

## 2024-09-04 DIAGNOSIS — C61 Malignant neoplasm of prostate: Secondary | ICD-10-CM | POA: Insufficient documentation

## 2024-09-04 DIAGNOSIS — Z01812 Encounter for preprocedural laboratory examination: Secondary | ICD-10-CM | POA: Diagnosis not present

## 2024-09-04 DIAGNOSIS — K219 Gastro-esophageal reflux disease without esophagitis: Secondary | ICD-10-CM | POA: Diagnosis not present

## 2024-09-04 DIAGNOSIS — Z8614 Personal history of Methicillin resistant Staphylococcus aureus infection: Secondary | ICD-10-CM

## 2024-09-04 HISTORY — DX: Malignant (primary) neoplasm, unspecified: C80.1

## 2024-09-04 LAB — CBC
HCT: 47 % (ref 39.0–52.0)
Hemoglobin: 15 g/dL (ref 13.0–17.0)
MCH: 30.4 pg (ref 26.0–34.0)
MCHC: 31.9 g/dL (ref 30.0–36.0)
MCV: 95.1 fL (ref 80.0–100.0)
Platelets: 197 K/uL (ref 150–400)
RBC: 4.94 MIL/uL (ref 4.22–5.81)
RDW: 13.3 % (ref 11.5–15.5)
WBC: 5.2 K/uL (ref 4.0–10.5)
nRBC: 0 % (ref 0.0–0.2)

## 2024-09-04 LAB — COMPREHENSIVE METABOLIC PANEL WITH GFR
ALT: 27 U/L (ref 0–44)
AST: 23 U/L (ref 15–41)
Albumin: 4.4 g/dL (ref 3.5–5.0)
Alkaline Phosphatase: 59 U/L (ref 38–126)
Anion gap: 9 (ref 5–15)
BUN: 15 mg/dL (ref 8–23)
CO2: 25 mmol/L (ref 22–32)
Calcium: 9.3 mg/dL (ref 8.9–10.3)
Chloride: 106 mmol/L (ref 98–111)
Creatinine, Ser: 1.04 mg/dL (ref 0.61–1.24)
GFR, Estimated: >60 mL/min (ref >60)
Glucose, Bld: 105 mg/dL — ABNORMAL HIGH (ref 70–99)
Potassium: 4.3 mmol/L (ref 3.5–5.1)
Sodium: 139 mmol/L (ref 135–145)
Total Bilirubin: 0.5 mg/dL (ref 0.0–1.2)
Total Protein: 7.2 g/dL (ref 6.5–8.1)

## 2024-09-04 LAB — TYPE AND SCREEN
ABO/RH(D): A NEG
Antibody Screen: NEGATIVE

## 2024-09-04 LAB — SURGICAL PCR SCREEN
MRSA, PCR: NEGATIVE
Staphylococcus aureus: NEGATIVE

## 2024-09-07 ENCOUNTER — Other Ambulatory Visit: Payer: Self-pay | Admitting: Family Medicine

## 2024-09-07 DIAGNOSIS — E66811 Obesity, class 1: Secondary | ICD-10-CM

## 2024-09-07 DIAGNOSIS — I10 Essential (primary) hypertension: Secondary | ICD-10-CM

## 2024-09-08 NOTE — Anesthesia Preprocedure Evaluation (Addendum)
 Anesthesia Evaluation  Patient identified by MRN, date of birth, ID band Patient awake    Reviewed: Allergy & Precautions, NPO status , Patient's Chart, lab work & pertinent test results  History of Anesthesia Complications Negative for: history of anesthetic complications  Airway Mallampati: II  TM Distance: >3 FB Neck ROM: Full    Dental no notable dental hx. (+) Teeth Intact, Dental Advisory Given   Pulmonary sleep apnea    Pulmonary exam normal breath sounds clear to auscultation       Cardiovascular hypertension, Pt. on medications (-) angina (-) Past MI Normal cardiovascular exam Rhythm:Regular Rate:Normal     Neuro/Psych  Headaches PSYCHIATRIC DISORDERS Anxiety Depression       GI/Hepatic ,GERD  ,,  Endo/Other  negative endocrine ROS    Renal/GU Lab Results      Component                Value               Date                      NA                       139                 09/04/2024                CL                       106                 09/04/2024                K                        4.3                 09/04/2024                CO2                      25                  09/04/2024                BUN                      15                  09/04/2024                CREATININE               1.04                09/04/2024               Prostate CA    Musculoskeletal  (+) Arthritis ,  C2-6 Fusiob    Abdominal   Peds  Hematology Lab Results      Component                Value               Date  WBC                      5.2                 09/04/2024                HGB                      15.0                09/04/2024                HCT                      47.0                09/04/2024                MCV                      95.1                09/04/2024                PLT                      197                 09/04/2024              Anesthesia Other Findings    Reproductive/Obstetrics                              Anesthesia Physical Anesthesia Plan  ASA: 3  Anesthesia Plan: General   Post-op Pain Management: Tylenol  PO (pre-op)*, Lidocaine  infusion* and Precedex    Induction: Intravenous  PONV Risk Score and Plan: Treatment may vary due to age or medical condition, Midazolam , Dexamethasone  and Ondansetron   Airway Management Planned: Oral ETT and Video Laryngoscope Planned  Additional Equipment: None  Intra-op Plan:   Post-operative Plan: Extubation in OR  Informed Consent: I have reviewed the patients History and Physical, chart, labs and discussed the procedure including the risks, benefits and alternatives for the proposed anesthesia with the patient or authorized representative who has indicated his/her understanding and acceptance.     Dental advisory given  Plan Discussed with: CRNA and Surgeon  Anesthesia Plan Comments: (See PAT note 09/04/2024)         Anesthesia Quick Evaluation

## 2024-09-08 NOTE — Progress Notes (Signed)
 Anesthesia Chart Review  Case: [id number] Date/Time: 09/18/24 0815   Procedures:      ROBOTIC ASSISTED LAPAROSCOPIC RADICAL PROSTATECTOMY     LYMPHADENECTOMY, PELVIS, ROBOT-ASSISTED   Anesthesia type: General   Diagnosis: Prostate cancer (HCC) [C61]   Pre-op diagnosis: PROSTATE CANCER   [facility] ROOM 03 / [facility]   Surgeons: Jason Williams, Jason Williams       DISCUSSION:63 y.o. never smoker with h/o sleep apnea, GERD, nonobstructive CAD per coronary CTA 2022, prostate cancer scheduled for above procedure 09/18/2024 with Dr. Steffan Jason.   S/p ACDF C3-C6.   Per cardiology preoperative evaluation 08/12/2024, Jason Williams's perioperative risk of a major cardiac event is 0.4% according to the Revised Cardiac Risk Index (RCRI).  Therefore, he is at low risk for perioperative complications.   His functional capacity is excellent at 9.89 METs according to the Duke Activity Status Index (DASI). Recommendations: According to ACC/AHA guidelines, no further cardiovascular testing needed.  The patient may proceed to surgery at acceptable risk.   Antiplatelet and/or Anticoagulation Recommendations: None requested   Pt advised to hold Ozempic  1 week prior to procedure.   VS: BP 116/82 Comment: right arm sitting  Pulse 72   Temp 36.7 C (Oral)   Resp 16   Ht 5' 10 (1.778 m)   Wt 81.6 kg   SpO2 99%   BMI 25.83 kg/m   PROVIDERS: Jason Williams, Jason Williams is PCP   Primary Cardiologist:  Jason Williams, Jason Williams  LABS: Labs reviewed: Acceptable for surgery. (all labs ordered are listed, but only abnormal results are displayed)  Labs Reviewed  COMPREHENSIVE METABOLIC PANEL WITH GFR - Abnormal; Notable for the following components:      Result Value   Glucose, Bld 105 (*)    All other components within normal limits  SURGICAL PCR SCREEN  CBC  TYPE AND SCREEN     IMAGES:   EKG:   CV: Exercise Tolerance Test 02/21/2023    Patient exercised according to the BRUCE protocol for  6:24min achieving 8.   Target HR was achieved (153bpm; 96% MPHR)   No ST deviation was noted.   No electrocardiographic evidence of ischemia  Echo 08/05/2015 - Left ventricle: The cavity size was normal. Systolic function was    normal. The estimated ejection fraction was 55%. Wall motion was    normal; there were no regional wall motion abnormalities.  - Aortic valve: There was trivial regurgitation.  - Left atrium: The atrium was mildly dilated.  - Atrial septum: No defect or patent foramen ovale was identified.  Past Medical History:  Diagnosis Date   Anxiety    Arthritis    Cancer (HCC)    prostate cancer   Depression    GERD (gastroesophageal reflux disease)    Headache    Heart murmur    as child   History of MRSA infection    toe on right foot   Hyperlipidemia    Hypertension    Sleep apnea    no CPAP  lost weight    Past Surgical History:  Procedure Laterality Date   ankle re construction Bilateral 11/27/2009   cadaver ligament used, no hardware per patient, by Dr. Beverley   ANTERIOR CERVICAL DECOMP/DISCECTOMY FUSION N/A 02/28/2023   Procedure: Anterior Cervical Decompression/Discectomy Fusion, Interbody Prosthesis, Plate/Screws Cervical Three-Cervical Four, Cervical Four-Cervical Five, Cervical Five- Cervical Six;  Surgeon: Mavis Purchase, Jason Williams;  Location: [facility] OR;  Service: Neurosurgery;  Laterality: N/A;  3C   APPENDECTOMY  11/28/1979   exploratory surgery d/t appendix bursting   EYE SURGERY Bilateral    lasik surgery   WISDOM TOOTH EXTRACTION      MEDICATIONS:  alfuzosin (UROXATRAL) 10 MG 24 hr tablet   amLODipine  (NORVASC ) 5 MG tablet   diazepam  (VALIUM ) 10 MG tablet   esomeprazole  (NEXIUM ) 40 MG capsule   Evolocumab  (REPATHA  SURECLICK) 140 MG/ML SOAJ   finasteride (PROSCAR) 5 MG tablet   fluticasone  (FLONASE ) 50 MCG/ACT nasal spray   HYDROcodone -acetaminophen  (NORCO) 5-325 MG tablet   levocetirizine (XYZAL ) 5 MG tablet   ondansetron  (ZOFRAN ) 4 MG  tablet   semaglutide -weight management (WEGOVY ) 2.4 MG/0.75ML SOAJ SQ injection   tadalafil  (CIALIS ) 20 MG tablet   traMADol  (ULTRAM ) 50 MG tablet   zolpidem  (AMBIEN ) 10 MG tablet   zolpidem  (AMBIEN ) 10 MG tablet   No current facility-administered medications for this encounter.    Jason Hoots Ward, PA-C [facility] Pre-Surgical Testing [phone]

## 2024-09-16 NOTE — H&P (Signed)
 63 year old male with a history of lower urinary tract symptoms which include dysuria, weak stream, and hesitancy. No known family history of prostate cancer. OSH PSA 0.85. In 2023. Previous PVR 47. 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 Pt sees cardiologist. Cards and Pulm hx negative.  PSH: Spinal surgery, knee surgery, ex lap for appendicitis. Has large midline incision. Incision was for perforated appendicitis had to be reopened several times for wound complications.   Lower urinary tract symptoms:  09/2023 was placed on tamsulosin  at last visit returns today for PVR. PVR today 73, patient is no longer taking tamsulosin  as he felt like it did not help and is unclear why he stopped taking it in January. He is a high AUA symptom score today with positive bothersome symptom is nocturia 3 times at night also feels like he is straining at night to urinate. He denies any gross hematuria no recent urinary tract infections.  12/12/2023: Started on finasteride then had gynecomastia and stopped currently on alfuzosin last visit, provided literature for prostate intervention. Follows up today with cystoscopy and prostate ultrasound. US  shows prostate 69cc. He is doing well on alfuzosin. UA normal. He is doing well on alfuzosin will call back if wants intervention.  05/09/24: Currently on Uroxatrol. Taking it 2x a day. PVR 15. Getting some dizziness.   Prostate Cancer:  05/09/2024: PSA 4.6 with 6% free, calculated risk of high risk cancer 35%, no recent abx, no blood thinners. Offered MRI patient not interested.  06/04/24: PRostate biopsy today  06/17/2024:Pathology significant for grade group 2 disease in the right medial and lateral base, grade group 3 disease in the right lateral apex. Grade group 3 is 10% of total core  [facility] Nomogram: 95% OS 39yr, 5 yr PFP 63%, 68yr PFP 47% , OCD 52%, ECE 44%, LN 9%, SV 6%, PVR 114. Alfuzosin. AUASS 4/5/4/4/4/5/5= 31  QOL6, SHIM 2/2/2/2/2= 10 Moderate ED, Midline incision from Ex lap for perforated appedniciits  09/18/24: Plan for RALP w/ BPLND today.    ALLERGIES: No Allergies    MEDICATIONS: Alfuzosin HCl ER 10 MG Tablet Extended Release 24 Hour 1 tablet PO Q HS  Bactrim DS 800-160 MG Tablet 1 tablet PO As Directed Please take 1 pilll 2 hours prior to biopsy and 1 pill at bedtime after biopsy  Ambien  10 MG Tablet Oral  amLODIPine  Bes+SyrSpend SF 1 tablet PO Daily  diazePAM  5 MG Tablet Oral  Lisinopril  10 MG Tablet 1 tablet PO Daily  Repatha  SureClick 140 MG/ML Solution Auto-injector 1 PO Daily  Sildenafil  Citrate 20 MG Oral Tablet 2-5 PO PRN  Tadalafil  20 MG Tablet 1 tablet PO PRN  traMADol  HCl 50 MG Tablet Oral     GU PSH: Prostate Needle Biopsy - 06/04/2024 Vasectomy, Bilateral     NON-GU PSH: Ankle Arthroscopy/surgery, Bilateral Appendectomy Dental Surgery Procedure Neck Surgery Surgical Pathology, Gross And Microscopic Examination For Prostate Needle - 06/04/2024 Visit Complexity (formerly GPC1X) - 05/09/2024, 12/12/2023, 10/22/2023         GU PMH: Elevated PSA - 06/04/2024, - 05/09/2024 BPH w/LUTS - 05/09/2024, - 12/12/2023, - 10/22/2023, - 2024 Nocturia - 05/09/2024, - 12/12/2023, - 10/22/2023, - 2024 Weak Urinary Stream - 12/12/2023, - 10/22/2023, - 2024 ED due to arterial insufficiency - 2020, Erectile dysfunction due to arterial insufficiency, - 2017    NON-GU PMH: Anxiety, Anxiety - 2017 Arthritis Cardiac murmur, unspecified GERD Hypercholesterolemia Hypertension Sleep Apnea    FAMILY HISTORY:  No significant past medical history - Runs In Family   SOCIAL HISTORY: Marital Status: Divorced Preferred Language: English; Ethnicity: Not Hispanic Or Latino; Race: White Current Smoking Status: Patient has never smoked.  <DIV'  Tobacco Use Assessment Completed:  Used Tobacco in last 30 days? Does not use smokeless tobacco. Does drink.  Does not use drugs. Drinks 3 caffeinated drinks per  day. </DIV'    Notes: Caffeine use, Divorced, Alcohol use, Never a smoker   REVIEW OF SYSTEMS:     GU Review Male:  Patient denies frequent urination, hard to postpone urination, burning/ pain with urination, get up at night to urinate, leakage of urine, stream starts and stops, trouble starting your stream, have to strain to urinate , erection problems, and penile pain.    Gastrointestinal (Upper):  Patient denies nausea, vomiting, and indigestion/ heartburn.    Gastrointestinal (Lower):  Patient denies diarrhea and constipation.    Constitutional:  Patient denies fever, night sweats, weight loss, and fatigue.    Skin:  Patient denies skin rash/ lesion and itching.    Eyes:  Patient denies blurred vision and double vision.    Ears/ Nose/ Throat:  Patient denies sore throat and sinus problems.    Hematologic/Lymphatic:  Patient denies swollen glands and easy bruising.    Cardiovascular:  Patient denies leg swelling and chest pains.    Respiratory:  Patient denies cough and shortness of breath.    Endocrine:  Patient denies excessive thirst.    Musculoskeletal:  Patient denies joint pain and back pain.    Neurological:  Patient denies headaches and dizziness.    Psychologic:  Patient denies depression and anxiety.    VITAL SIGNS: None     MULTI-SYSTEM PHYSICAL EXAMINATION:      Constitutional: Well-nourished. No physical deformities. Normally developed. Good grooming.     Respiratory: No labored breathing, no use of accessory muscles.      Cardiovascular: Normal temperature, normal extremity pulses, no swelling, no varicosities.     Gastrointestinal: Midline incision extending from upper abdomen just below xiphoid to mid lower abdomen in the midline.            Complexity of Data:   Source Of History:  Patient  Records Review:  AUA Symptom Score, IIEF Score  Urine Test Review:  Urinalysis  Urodynamics Review:  Review Bladder Scan    05/09/24 04/30/24 12/20/18  PSA  Total PSA 5.29  ng/mL 4.60 ng/mL 2.68 ng/mL  Free PSA 1.00 ng/mL 0.26 ng/mL   % Free PSA 19 % PSA 6 % PSA     PROCEDURES:       PVR Ultrasound - 48201  Scanned Volume: 114 cc    Visit Complexity - G2211      Urinalysis - 81003  Dipstick Dipstick Cont'd  Color: Yellow Bilirubin: Neg  Appearance: Clear Ketones: Neg  Specific Gravity: 1.020 Blood: Neg  pH: 6.0 Protein: Neg  Glucose: Neg Urobilinogen: 0.2   Nitrites: Neg   Leukocyte Esterase: Neg   Notes:       ASSESSMENT:     ICD-10 Details  1 GU:  Prostate Cancer - C61 Undiagnosed New Problem   PLAN:   Orders  Labs Urine Culture, BMP  Schedule  X-Rays: 1 Week - Bone Scan   1 Week - C.T. Abdomen/Pelvis With I.V. Contrast  Document  Letter(s):  Created for Patient: Clinical Summary   Notes:  Prostate cancer: Grade group 3 prostate cancer unfavorable intermediate risk. Will get CT as well  as bone scan Chem-7 ordered today.   We discussed management of prostate cancer which included heart surveillance, and treatment with radiation versus surgery.   Explained the patient that I felt like active surveillance would be a poor decision based on his unfavorable intermediate risk and would not be recommended.   Recommended treatment explained to patient that radiation and surgery are equivalent in terms of cancer removal. I recommended surgery due to patient's age as well as significant lower urinary tract symptoms.   We discussed benefits alternatives to procedure including bleeding infection demonstrating structures possible bowel injury possible anastomotic leak possible lymphatic leak possible need for long-term catheter need for drain risk of injury to obturator nerve risk of incontinence and arrives for erectile dysfunction.   Also discussed risks of radiation including low urine tract symptoms, worsening rectal symptoms, secondary malignancy radiation cystitis stricture rectal dysfunction incontinence as well as need for space O AR  patient voiced understanding.   After long discussion patient would like to proceed with surgery this will be complicated due to his previous exploratory laparotomy for perforated appendicitis when he was 18 exam demonstrates a long midline incision from xiphoid to lower abdomen.    09/18/24: plan for RALP w/ BPLND today   UCX negative.

## 2024-09-18 ENCOUNTER — Encounter (HOSPITAL_COMMUNITY): Payer: Self-pay | Admitting: Urology

## 2024-09-18 ENCOUNTER — Ambulatory Visit (HOSPITAL_COMMUNITY)
Admission: RE | Admit: 2024-09-18 | Discharge: 2024-09-19 | Disposition: A | Discharge status: Home / Self Care | Attending: Urology | Admitting: Urology

## 2024-09-18 ENCOUNTER — Encounter (HOSPITAL_COMMUNITY): Admission: RE | Disposition: A | Payer: Self-pay | Source: Home / Self Care | Attending: Urology

## 2024-09-18 ENCOUNTER — Encounter (INDEPENDENT_AMBULATORY_CARE_PROVIDER_SITE_OTHER): Payer: Self-pay

## 2024-09-18 ENCOUNTER — Ambulatory Visit (HOSPITAL_COMMUNITY): Payer: Self-pay | Admitting: Anesthesiology

## 2024-09-18 ENCOUNTER — Other Ambulatory Visit: Payer: Self-pay

## 2024-09-18 ENCOUNTER — Ambulatory Visit (HOSPITAL_COMMUNITY): Payer: Self-pay | Admitting: Physician Assistant

## 2024-09-18 DIAGNOSIS — I10 Essential (primary) hypertension: Secondary | ICD-10-CM | POA: Diagnosis not present

## 2024-09-18 DIAGNOSIS — K219 Gastro-esophageal reflux disease without esophagitis: Secondary | ICD-10-CM | POA: Diagnosis not present

## 2024-09-18 DIAGNOSIS — F418 Other specified anxiety disorders: Secondary | ICD-10-CM | POA: Diagnosis not present

## 2024-09-18 DIAGNOSIS — Z79899 Other long term (current) drug therapy: Secondary | ICD-10-CM | POA: Diagnosis not present

## 2024-09-18 DIAGNOSIS — Z23 Encounter for immunization: Secondary | ICD-10-CM | POA: Insufficient documentation

## 2024-09-18 DIAGNOSIS — C61 Malignant neoplasm of prostate: Secondary | ICD-10-CM | POA: Diagnosis not present

## 2024-09-18 DIAGNOSIS — N400 Enlarged prostate without lower urinary tract symptoms: Secondary | ICD-10-CM | POA: Diagnosis not present

## 2024-09-18 DIAGNOSIS — M1990 Unspecified osteoarthritis, unspecified site: Secondary | ICD-10-CM | POA: Diagnosis not present

## 2024-09-18 DIAGNOSIS — G4730 Sleep apnea, unspecified: Secondary | ICD-10-CM | POA: Insufficient documentation

## 2024-09-18 HISTORY — PX: ROBOT ASSISTED LAPAROSCOPIC RADICAL PROSTATECTOMY: SHX5141

## 2024-09-18 LAB — BASIC METABOLIC PANEL WITH GFR
Anion gap: 10 (ref 5–15)
BUN: 15 mg/dL (ref 8–23)
CO2: 24 mmol/L (ref 22–32)
Calcium: 8.4 mg/dL — ABNORMAL LOW (ref 8.9–10.3)
Chloride: 104 mmol/L (ref 98–111)
Creatinine, Ser: 1.13 mg/dL (ref 0.61–1.24)
GFR, Estimated: >60 mL/min (ref >60)
Glucose, Bld: 165 mg/dL — ABNORMAL HIGH (ref 70–99)
Potassium: 4 mmol/L (ref 3.5–5.1)
Sodium: 139 mmol/L (ref 135–145)

## 2024-09-18 LAB — CBC
HCT: 38.3 % — ABNORMAL LOW (ref 39.0–52.0)
Hemoglobin: 12.5 g/dL — ABNORMAL LOW (ref 13.0–17.0)
MCH: 31.3 pg (ref 26.0–34.0)
MCHC: 32.6 g/dL (ref 30.0–36.0)
MCV: 96 fL (ref 80.0–100.0)
Platelets: 164 K/uL (ref 150–400)
RBC: 3.99 MIL/uL — ABNORMAL LOW (ref 4.22–5.81)
RDW: 13.4 % (ref 11.5–15.5)
WBC: 11.5 K/uL — ABNORMAL HIGH (ref 4.0–10.5)
nRBC: 0 % (ref 0.0–0.2)

## 2024-09-18 SURGERY — ROBOTIC ASSISTED LAPAROSCOPIC RADICAL PROSTATECTOMY
Anesthesia: General

## 2024-09-18 MED ORDER — PANTOPRAZOLE SODIUM 40 MG PO TBEC
40.0000 mg | DELAYED_RELEASE_TABLET | Freq: Every day | ORAL | Status: DC
  Administered 2024-09-18 – 2024-09-19 (×2): 40 mg via ORAL
  Filled 2024-09-18 (×2): qty 1

## 2024-09-18 MED ORDER — HEMOSTATIC AGENTS (NO CHARGE) OPTIME
TOPICAL | Status: DC | PRN
  Administered 2024-09-18: 1 via TOPICAL

## 2024-09-18 MED ORDER — BUPIVACAINE LIPOSOME 1.3 % IJ SUSP
INTRAMUSCULAR | Status: DC | PRN
  Administered 2024-09-18: 20 mL

## 2024-09-18 MED ORDER — ACETAMINOPHEN 500 MG PO TABS
1000.0000 mg | ORAL_TABLET | Freq: Once | ORAL | Status: AC
Start: 2024-09-18 — End: 2024-09-18
  Administered 2024-09-18: 1000 mg via ORAL
  Filled 2024-09-18: qty 2

## 2024-09-18 MED ORDER — ALBUMIN HUMAN 5 % IV SOLN: INTRAVENOUS | Status: DC | PRN

## 2024-09-18 MED ORDER — LACTATED RINGERS IV SOLN: INTRAVENOUS | Status: DC

## 2024-09-18 MED ORDER — DIPHENHYDRAMINE HCL 50 MG/ML IJ SOLN: 12.5000 mg | Freq: Two times a day (BID) | INTRAMUSCULAR | Status: DC | PRN

## 2024-09-18 MED ORDER — HYDROMORPHONE HCL 1 MG/ML IJ SOLN
INTRAMUSCULAR | Status: AC
  Filled 2024-09-18: qty 2

## 2024-09-18 MED ORDER — EPHEDRINE 5 MG/ML INJ
INTRAVENOUS | Status: AC
  Filled 2024-09-18: qty 5

## 2024-09-18 MED ORDER — SODIUM CHLORIDE (PF) 0.9 % IJ SOLN
INTRAMUSCULAR | Status: AC
Start: 2024-09-18 — End: 2024-09-18
  Filled 2024-09-18: qty 20

## 2024-09-18 MED ORDER — CEFAZOLIN SODIUM 1 G IJ SOLR
INTRAMUSCULAR | Status: AC
  Filled 2024-09-18: qty 20

## 2024-09-18 MED ORDER — ONDANSETRON HCL 4 MG/2ML IJ SOLN: 4.0000 mg | Freq: Once | INTRAMUSCULAR | Status: DC | PRN

## 2024-09-18 MED ORDER — ONDANSETRON HCL 4 MG/2ML IJ SOLN: 4.0000 mg | INTRAMUSCULAR | Status: DC | PRN

## 2024-09-18 MED ORDER — ROCURONIUM BROMIDE 10 MG/ML (PF) SYRINGE
PREFILLED_SYRINGE | INTRAVENOUS | Status: AC
  Filled 2024-09-18: qty 10

## 2024-09-18 MED ORDER — SODIUM CHLORIDE (PF) 0.9 % IJ SOLN
INTRAMUSCULAR | Status: AC
  Filled 2024-09-18: qty 20

## 2024-09-18 MED ORDER — METHOCARBAMOL 500 MG PO TABS
ORAL_TABLET | ORAL | Status: AC
  Filled 2024-09-18: qty 2

## 2024-09-18 MED ORDER — SODIUM CHLORIDE 0.9 % IV BOLUS
1000.0000 mL | Freq: Once | INTRAVENOUS | Status: AC
  Administered 2024-09-18: 1000 mL via INTRAVENOUS

## 2024-09-18 MED ORDER — CHLORHEXIDINE GLUCONATE 0.12 % MT SOLN
15.0000 mL | Freq: Once | OROMUCOSAL | Status: AC
  Administered 2024-09-18: 15 mL via OROMUCOSAL

## 2024-09-18 MED ORDER — DEXAMETHASONE SOD PHOSPHATE PF 10 MG/ML IJ SOLN
INTRAMUSCULAR | Status: DC | PRN
  Administered 2024-09-18: 5 mg via INTRAVENOUS

## 2024-09-18 MED ORDER — ORAL CARE MOUTH RINSE: 15.0000 mL | Freq: Once | OROMUCOSAL | Status: AC

## 2024-09-18 MED ORDER — ACETAMINOPHEN 500 MG PO TABS
1000.0000 mg | ORAL_TABLET | Freq: Four times a day (QID) | ORAL | Status: AC
  Administered 2024-09-18 (×2): 1000 mg via ORAL
  Filled 2024-09-18: qty 2

## 2024-09-18 MED ORDER — SUGAMMADEX SODIUM 200 MG/2ML IV SOLN
INTRAVENOUS | Status: DC | PRN
  Administered 2024-09-18: 200 mg via INTRAVENOUS

## 2024-09-18 MED ORDER — LIDOCAINE HCL (PF) 2 % IJ SOLN
INTRAMUSCULAR | Status: DC | PRN
  Administered 2024-09-18: 100 mg via INTRADERMAL

## 2024-09-18 MED ORDER — SODIUM CHLORIDE 0.9 % IV SOLN: INTRAVENOUS | Status: DC | PRN

## 2024-09-18 MED ORDER — SUGAMMADEX SODIUM 200 MG/2ML IV SOLN
INTRAVENOUS | Status: AC
  Filled 2024-09-18: qty 2

## 2024-09-18 MED ORDER — ONDANSETRON HCL 4 MG/2ML IJ SOLN
INTRAMUSCULAR | Status: DC | PRN
  Administered 2024-09-18: 4 mg via INTRAVENOUS

## 2024-09-18 MED ORDER — BUPIVACAINE LIPOSOME 1.3 % IJ SUSP
INTRAMUSCULAR | Status: AC
  Filled 2024-09-18: qty 20

## 2024-09-18 MED ORDER — CEFAZOLIN SODIUM-DEXTROSE 1-4 GM/50ML-% IV SOLN
1.0000 g | Freq: Three times a day (TID) | INTRAVENOUS | Status: AC
  Administered 2024-09-18 – 2024-09-19 (×2): 1 g via INTRAVENOUS
  Filled 2024-09-18 (×2): qty 50

## 2024-09-18 MED ORDER — ACETAMINOPHEN 500 MG PO TABS
ORAL_TABLET | ORAL | Status: AC
  Filled 2024-09-18: qty 2

## 2024-09-18 MED ORDER — OXYCODONE HCL 5 MG PO TABS
ORAL_TABLET | ORAL | Status: AC
  Filled 2024-09-18: qty 1

## 2024-09-18 MED ORDER — DIPHENHYDRAMINE HCL 12.5 MG/5ML PO ELIX: 12.5000 mg | ORAL_SOLUTION | Freq: Two times a day (BID) | ORAL | Status: DC | PRN

## 2024-09-18 MED ORDER — MIDAZOLAM HCL 2 MG/2ML IJ SOLN
INTRAMUSCULAR | Status: AC
  Filled 2024-09-18: qty 2

## 2024-09-18 MED ORDER — FLUTICASONE PROPIONATE 50 MCG/ACT NA SUSP
2.0000 | Freq: Every day | NASAL | Status: DC
  Administered 2024-09-19: 2 via NASAL
  Filled 2024-09-18: qty 16

## 2024-09-18 MED ORDER — PHENYLEPHRINE HCL-NACL 20-0.9 MG/250ML-% IV SOLN
INTRAVENOUS | Status: DC | PRN
  Administered 2024-09-18: 20 ug/min via INTRAVENOUS

## 2024-09-18 MED ORDER — KETOROLAC TROMETHAMINE 30 MG/ML IJ SOLN
INTRAMUSCULAR | Status: AC
  Filled 2024-09-18: qty 1

## 2024-09-18 MED ORDER — METHOCARBAMOL 500 MG PO TABS
1000.0000 mg | ORAL_TABLET | Freq: Four times a day (QID) | ORAL | Status: DC
Start: 2024-09-18 — End: 2024-09-19
  Administered 2024-09-18 – 2024-09-19 (×6): 1000 mg via ORAL
  Filled 2024-09-18 (×5): qty 2

## 2024-09-18 MED ORDER — PROPOFOL 10 MG/ML IV BOLUS
INTRAVENOUS | Status: DC | PRN
  Administered 2024-09-18: 120 mg via INTRAVENOUS

## 2024-09-18 MED ORDER — MIDAZOLAM HCL (PF) 2 MG/2ML IJ SOLN
INTRAMUSCULAR | Status: DC | PRN
  Administered 2024-09-18: 2 mg via INTRAVENOUS

## 2024-09-18 MED ORDER — LIDOCAINE 2% (20 MG/ML) 5 ML SYRINGE
INTRAMUSCULAR | Status: DC | PRN
  Administered 2024-09-18: 1.5 mg/kg/h via INTRAVENOUS

## 2024-09-18 MED ORDER — HYDROMORPHONE HCL 1 MG/ML IJ SOLN
0.5000 mg | INTRAMUSCULAR | Status: DC | PRN
  Administered 2024-09-18 – 2024-09-19 (×8): 1 mg via INTRAVENOUS
  Filled 2024-09-18 (×7): qty 1

## 2024-09-18 MED ORDER — GLYCOPYRROLATE 0.2 MG/ML IJ SOLN
INTRAMUSCULAR | Status: DC | PRN
  Administered 2024-09-18 (×2): .1 mg via INTRAVENOUS

## 2024-09-18 MED ORDER — PROPOFOL 10 MG/ML IV BOLUS
INTRAVENOUS | Status: AC
  Filled 2024-09-18: qty 20

## 2024-09-18 MED ORDER — OXYCODONE HCL 5 MG PO TABS
5.0000 mg | ORAL_TABLET | ORAL | Status: DC | PRN
  Administered 2024-09-18 – 2024-09-19 (×4): 5 mg via ORAL
  Filled 2024-09-18 (×4): qty 1

## 2024-09-18 MED ORDER — SENNOSIDES-DOCUSATE SODIUM 8.6-50 MG PO TABS
2.0000 | ORAL_TABLET | Freq: Every day | ORAL | Status: DC
  Administered 2024-09-18: 2 via ORAL
  Filled 2024-09-18: qty 2

## 2024-09-18 MED ORDER — SODIUM CHLORIDE 0.9 % IV SOLN: INTRAVENOUS | Status: DC

## 2024-09-18 MED ORDER — HYDROMORPHONE HCL 1 MG/ML IJ SOLN
0.2500 mg | INTRAMUSCULAR | Status: DC | PRN
  Administered 2024-09-18 (×4): 0.5 mg via INTRAVENOUS

## 2024-09-18 MED ORDER — BUPIVACAINE HCL (PF) 0.25 % IJ SOLN
INTRAMUSCULAR | Status: AC
  Filled 2024-09-18: qty 30

## 2024-09-18 MED ORDER — LACTATED RINGERS IR SOLN
Status: DC | PRN
  Administered 2024-09-18: 1000 mL

## 2024-09-18 MED ORDER — CEFAZOLIN SODIUM-DEXTROSE 2-4 GM/100ML-% IV SOLN
2.0000 g | INTRAVENOUS | Status: AC
  Administered 2024-09-18 (×2): 2 g via INTRAVENOUS
  Filled 2024-09-18: qty 100

## 2024-09-18 MED ORDER — DIAZEPAM 5 MG PO TABS: 10.0000 mg | ORAL_TABLET | Freq: Two times a day (BID) | ORAL | Status: DC | PRN

## 2024-09-18 MED ORDER — ROCURONIUM BROMIDE 10 MG/ML (PF) SYRINGE
PREFILLED_SYRINGE | INTRAVENOUS | Status: DC | PRN
  Administered 2024-09-18: 60 mg via INTRAVENOUS
  Administered 2024-09-18: 20 mg via INTRAVENOUS
  Administered 2024-09-18: 15 mg via INTRAVENOUS
  Administered 2024-09-18 (×3): 20 mg via INTRAVENOUS

## 2024-09-18 MED ORDER — OXYCODONE HCL 5 MG/5ML PO SOLN: 5.0000 mg | Freq: Once | ORAL | Status: AC | PRN

## 2024-09-18 MED ORDER — STERILE WATER FOR IRRIGATION IR SOLN
Status: DC | PRN
  Administered 2024-09-18: 1000 mL

## 2024-09-18 MED ORDER — FENTANYL CITRATE (PF) 250 MCG/5ML IJ SOLN
INTRAMUSCULAR | Status: AC
  Filled 2024-09-18: qty 5

## 2024-09-18 MED ORDER — OXYCODONE HCL 5 MG PO TABS
5.0000 mg | ORAL_TABLET | Freq: Once | ORAL | Status: AC | PRN
  Administered 2024-09-18: 5 mg via ORAL

## 2024-09-18 MED ORDER — AMLODIPINE BESYLATE 10 MG PO TABS
5.0000 mg | ORAL_TABLET | Freq: Every day | ORAL | Status: DC
Start: 2024-09-18 — End: 2024-09-19
  Administered 2024-09-18 – 2024-09-19 (×2): 5 mg via ORAL
  Filled 2024-09-18 (×2): qty 1

## 2024-09-18 MED ORDER — EPHEDRINE SULFATE (PRESSORS) 25 MG/5ML IV SOSY
PREFILLED_SYRINGE | INTRAVENOUS | Status: DC | PRN
  Administered 2024-09-18 (×3): 5 mg via INTRAVENOUS
  Administered 2024-09-18 (×2): 10 mg via INTRAVENOUS

## 2024-09-18 MED ORDER — ZOLPIDEM TARTRATE 5 MG PO TABS
5.0000 mg | ORAL_TABLET | Freq: Every day | ORAL | Status: DC
  Administered 2024-09-18: 5 mg via ORAL
  Filled 2024-09-18: qty 1

## 2024-09-18 MED ORDER — FENTANYL CITRATE (PF) 250 MCG/5ML IJ SOLN
INTRAMUSCULAR | Status: DC | PRN
  Administered 2024-09-18: 75 ug via INTRAVENOUS
  Administered 2024-09-18: 50 ug via INTRAVENOUS
  Administered 2024-09-18 (×3): 25 ug via INTRAVENOUS
  Administered 2024-09-18: 50 ug via INTRAVENOUS

## 2024-09-18 MED ORDER — LIDOCAINE HCL (PF) 2 % IJ SOLN
INTRAMUSCULAR | Status: AC
  Filled 2024-09-18: qty 25

## 2024-09-18 MED ORDER — POLYETHYLENE GLYCOL 3350 17 G PO PACK
17.0000 g | PACK | Freq: Every day | ORAL | Status: DC
  Administered 2024-09-18 – 2024-09-19 (×2): 17 g via ORAL
  Filled 2024-09-18 (×2): qty 1

## 2024-09-18 MED ORDER — DEXMEDETOMIDINE HCL IN NACL 80 MCG/20ML IV SOLN
INTRAVENOUS | Status: DC | PRN
  Administered 2024-09-18 (×3): 10 ug via INTRAVENOUS

## 2024-09-18 MED ORDER — KETOROLAC TROMETHAMINE 30 MG/ML IJ SOLN
30.0000 mg | Freq: Once | INTRAMUSCULAR | Status: AC | PRN
  Administered 2024-09-18: 30 mg via INTRAVENOUS

## 2024-09-18 MED ORDER — HYDROMORPHONE HCL 1 MG/ML IJ SOLN
INTRAMUSCULAR | Status: AC
  Filled 2024-09-18: qty 1

## 2024-09-18 MED ORDER — BUPIVACAINE HCL (PF) 0.25 % IJ SOLN
INTRAMUSCULAR | Status: DC | PRN
  Administered 2024-09-18: 30 mL

## 2024-09-18 MED ORDER — ALBUMIN HUMAN 5 % IV SOLN
INTRAVENOUS | Status: AC
  Filled 2024-09-18: qty 500

## 2024-09-18 MED ORDER — DEXMEDETOMIDINE HCL IN NACL 80 MCG/20ML IV SOLN
INTRAVENOUS | Status: AC
  Filled 2024-09-18: qty 20

## 2024-09-18 MED ORDER — INFLUENZA VIRUS VACC SPLIT PF (FLUZONE) 0.5 ML IM SUSY
0.5000 mL | PREFILLED_SYRINGE | INTRAMUSCULAR | Status: AC
  Administered 2024-09-19: 0.5 mL via INTRAMUSCULAR
  Filled 2024-09-18: qty 0.5

## 2024-09-18 MED ORDER — HYOSCYAMINE SULFATE 0.125 MG SL SUBL
0.1250 mg | SUBLINGUAL_TABLET | SUBLINGUAL | Status: DC | PRN
  Administered 2024-09-19: 0.25 mg via SUBLINGUAL
  Filled 2024-09-18: qty 2

## 2024-09-18 SURGICAL SUPPLY — 110 items
APPLICATOR ARISTA FLEXITIP XL (MISCELLANEOUS) ×1 IMPLANT
APPLICATOR COTTON TIP 6 STRL (MISCELLANEOUS) ×1 IMPLANT
APPLICATOR SURGIFLO ENDO (HEMOSTASIS) ×1 IMPLANT
BAG COUNTER SPONGE SURGICOUNT (BAG) IMPLANT
BAG URINE DRAIN 2000ML AR STRL (UROLOGICAL SUPPLIES) ×1 IMPLANT
BLADE EXTENDED COATED 6.5IN (ELECTRODE) ×1 IMPLANT
BLADE HEX COATED 2.75 (ELECTRODE) ×1 IMPLANT
CATH FOLEY 2WAY 5CC 20FR (CATHETERS) ×1 IMPLANT
CATH FOLEY 2WAY SLVR 30CC 20FR (CATHETERS) ×1 IMPLANT
CATH FOLEY 2WAY SLVR 5CC 18FR (CATHETERS) ×1 IMPLANT
CATH ROBINSON RED A/P 16FR (CATHETERS) ×1 IMPLANT
CATH TIEMANN FOLEY 18FR 5CC (CATHETERS) ×1 IMPLANT
CHLORAPREP W/TINT 26 (MISCELLANEOUS) ×1 IMPLANT
CLIP APPLIE 11 MED OPEN (CLIP) ×1 IMPLANT
CLIP APPLIE 9.375 SM OPEN (CLIP) ×1 IMPLANT
CLIP LIGATING HEM O LOK PURPLE (MISCELLANEOUS) ×4 IMPLANT
CLIP LIGATING HEMO O LOK GREEN (MISCELLANEOUS) ×4 IMPLANT
CLIP TI LARGE 6 (CLIP) ×1 IMPLANT
COVER SURGICAL LIGHT HANDLE (MISCELLANEOUS) ×1 IMPLANT
COVER TIP SHEARS 8 DVNC (MISCELLANEOUS) ×1 IMPLANT
CUTTER ECHEON FLEX ENDO 45 340 (ENDOMECHANICALS) ×1 IMPLANT
DERMABOND ADVANCED .7 DNX12 (GAUZE/BANDAGES/DRESSINGS) ×2 IMPLANT
DERMABOND ADVANCED .7 DNX6 (GAUZE/BANDAGES/DRESSINGS) ×1 IMPLANT
DISSECTOR ROUND CHERRY 3/8 STR (MISCELLANEOUS) ×1 IMPLANT
DRAIN CHANNEL RND F F (WOUND CARE) IMPLANT
DRAPE ARM DVNC X/XI (DISPOSABLE) ×4 IMPLANT
DRAPE COLUMN DVNC XI (DISPOSABLE) ×1 IMPLANT
DRAPE LAPAROTOMY TRNSV 102X78 (DRAPES) ×1 IMPLANT
DRAPE SURG IRRIG POUCH 19X23 (DRAPES) ×1 IMPLANT
DRAPE WARM FLUID 44X44 (DRAPES) ×1 IMPLANT
DRIVER NDL LRG 8 DVNC XI (INSTRUMENTS) ×2 IMPLANT
DRIVER NDLE LRG 8 DVNC XI (INSTRUMENTS) ×2 IMPLANT
DRSG TEGADERM 4X4.75 (GAUZE/BANDAGES/DRESSINGS) ×1 IMPLANT
DRSG TELFA 4X8 ISLAND (GAUZE/BANDAGES/DRESSINGS) ×1 IMPLANT
ELECT PENCIL ROCKER SW 15FT (MISCELLANEOUS) ×1 IMPLANT
ELECT REM PT RETURN 15FT ADLT (MISCELLANEOUS) ×1 IMPLANT
EVACUATOR SILICONE 100CC (DRAIN) IMPLANT
FORCEPS BPLR LNG DVNC XI (INSTRUMENTS) ×1 IMPLANT
FORCEPS PROGRASP DVNC XI (FORCEP) ×1 IMPLANT
GAUZE 4X4 16PLY ~~LOC~~+RFID DBL (SPONGE) ×1 IMPLANT
GAUZE SPONGE 2X2 8PLY STRL LF (GAUZE/BANDAGES/DRESSINGS) IMPLANT
GAUZE SPONGE 4X4 12PLY STRL (GAUZE/BANDAGES/DRESSINGS) ×1 IMPLANT
GLOVE BIO SURGEON STRL SZ 6.5 (GLOVE) ×1 IMPLANT
GLOVE BIO SURGEON STRL SZ8 (GLOVE) ×2 IMPLANT
GLOVE BIOGEL PI IND STRL 8.5 (GLOVE) ×2 IMPLANT
GLOVE SURG LX STRL 8.0 MICRO (GLOVE) ×1 IMPLANT
GOWN STRL REUS W/ TWL LRG LVL3 (GOWN DISPOSABLE) ×2 IMPLANT
GOWN STRL REUS W/ TWL XL LVL3 (GOWN DISPOSABLE) ×2 IMPLANT
GOWN STRL SURGICAL XL XLNG (GOWN DISPOSABLE) ×1 IMPLANT
HEMOSTAT ARISTA ABSORB 3G PWDR (HEMOSTASIS) ×1 IMPLANT
HEMOSTAT SURGICEL 2X4 FIBR (HEMOSTASIS) ×1 IMPLANT
HEMOSTAT SURGICEL 4X8 (HEMOSTASIS) ×1 IMPLANT
HOLDER FOLEY CATH W/STRAP (MISCELLANEOUS) ×1 IMPLANT
IRRIGATION SUCT STRKRFLW 2 WTP (MISCELLANEOUS) ×1 IMPLANT
IV LACTATED RINGERS 1000ML (IV SOLUTION) IMPLANT
KIT BASIN OR (CUSTOM PROCEDURE TRAY) ×1 IMPLANT
KIT TURNOVER KIT A (KITS) ×1 IMPLANT
LIGASURE IMPACT 36 18CM CVD LR (INSTRUMENTS) ×1 IMPLANT
MARKER SKIN DUAL TIP RULER LAB (MISCELLANEOUS) IMPLANT
NDL INSUFFLATION 14GA 120MM (NEEDLE) ×1 IMPLANT
NDL MAYO 6 CRC TAPER PT (NEEDLE) ×2 IMPLANT
NEEDLE INSUFFLATION 14GA 120MM (NEEDLE) ×1 IMPLANT
NEEDLE MAYO 6 CRC TAPER PT (NEEDLE) IMPLANT
NS IRRIG 1000ML POUR BTL (IV SOLUTION) ×2 IMPLANT
PACK GENERAL/GYN (CUSTOM PROCEDURE TRAY) ×1 IMPLANT
PACK ROBOT UROLOGY CUSTOM (CUSTOM PROCEDURE TRAY) ×1 IMPLANT
PAD POSITIONING PINK XL (MISCELLANEOUS) ×1 IMPLANT
PLUG CATH AND CAP STRL 200 (CATHETERS) ×1 IMPLANT
PORT ACCESS TROCAR AIRSEAL 12 (TROCAR) ×1 IMPLANT
POUCH LAPAROSCOPIC INSTRUMENT (MISCELLANEOUS) ×1 IMPLANT
RELOAD STAPLE 45 4.1 GRN THCK (STAPLE) ×1 IMPLANT
SCISSORS LAP 5X45 EPIX DISP (ENDOMECHANICALS) IMPLANT
SCISSORS MNPLR CVD DVNC XI (INSTRUMENTS) ×1 IMPLANT
SEAL UNIV 5-12 XI (MISCELLANEOUS) ×4 IMPLANT
SET IRRIG Y TYPE TUR BLADDER L (SET/KITS/TRAYS/PACK) IMPLANT
SET TRI-LUMEN FLTR TB AIRSEAL (TUBING) ×1 IMPLANT
SOL PREP POV-IOD 4OZ 10% (MISCELLANEOUS) ×1 IMPLANT
SOLUTION ELECTROSURG ANTI STCK (MISCELLANEOUS) ×1 IMPLANT
SPIKE FLUID TRANSFER (MISCELLANEOUS) ×1 IMPLANT
SPONGE SURGIFLO 8M (HEMOSTASIS) ×1 IMPLANT
SPONGE SURGIFOAM ABS GEL 100 (HEMOSTASIS) ×1 IMPLANT
SPONGE T-LAP 4X18 ~~LOC~~+RFID (SPONGE) ×1 IMPLANT
STAPLER SKIN PROX 35W (STAPLE) IMPLANT
SURGIFLO W/THROMBIN 8M KIT (HEMOSTASIS) ×1 IMPLANT
SUT CHROMIC 0 UR 5 27 (SUTURE) IMPLANT
SUT CHROMIC 2 0 SH (SUTURE) ×1 IMPLANT
SUT CHROMIC 2 0 UR 5 27 (SUTURE) IMPLANT
SUT ETHILON 3 0 PS 1 (SUTURE) ×1 IMPLANT
SUT ETHILON 4 0 PS 2 18 (SUTURE) IMPLANT
SUT MNCRL AB 4-0 PS2 18 (SUTURE) ×2 IMPLANT
SUT PDS AB 0 CT1 36 (SUTURE) ×2 IMPLANT
SUT PDS AB 1 CTX 36 (SUTURE) ×1 IMPLANT
SUT SILK 0 TIES 10X30 (SUTURE) ×1 IMPLANT
SUT SILK 2 0 TIES 10X30 (SUTURE) ×1 IMPLANT
SUT VIC AB 0 CT1 27XBRD ANTBC (SUTURE) ×1 IMPLANT
SUT VIC AB 0 CTX 27 (SUTURE) IMPLANT
SUT VIC AB 1 CTX36XBRD ANBCTR (SUTURE) IMPLANT
SUT VIC AB 2-0 CTX 36 (SUTURE) ×6 IMPLANT
SUT VIC AB 2-0 SH 27X BRD (SUTURE) ×2 IMPLANT
SUT VIC AB 2-0 UR6 27 (SUTURE) ×8 IMPLANT
SUT VIC AB 3-0 SH 27X BRD (SUTURE) ×2 IMPLANT
SUT VIC AB 3-0 SH 27XBRD (SUTURE) IMPLANT
SUT VICRYL 0 UR6 27IN ABS (SUTURE) IMPLANT
SUT VICRYL RAPIDE 4/0 PS 2 (SUTURE) ×1 IMPLANT
SUT VLOC 3-0 9IN GRN (SUTURE) ×1 IMPLANT
SUTURE VLOC BRB 180 ABS3/0GR12 (SUTURE) ×2 IMPLANT
SYR 30ML LL (SYRINGE) ×1 IMPLANT
SYRINGE TOOMEY IRRIG 70ML (MISCELLANEOUS) ×1 IMPLANT
TOWEL OR 17X26 10 PK STRL BLUE (TOWEL DISPOSABLE) ×2 IMPLANT
WATER STERILE IRR 1000ML POUR (IV SOLUTION) ×1 IMPLANT

## 2024-09-18 NOTE — Anesthesia Procedure Notes (Signed)
 Procedure Name: Intubation Date/Time: 09/18/2024 8:31 AM  Performed by: Augusta Daved SAILOR, CRNAPre-anesthesia Checklist: Patient identified, Emergency Drugs available, Suction available and Patient being monitored Patient Re-evaluated:Patient Re-evaluated prior to induction Oxygen Delivery Method: Circle System Utilized Preoxygenation: Pre-oxygenation with 100% oxygen Induction Type: IV induction Ventilation: Mask ventilation without difficulty and Oral airway inserted - appropriate to patient size Laryngoscope Size: Glidescope and 3 Grade View: Grade I Tube type: Oral Tube size: 7.5 mm Number of attempts: 1 Airway Equipment and Method: Stylet and Oral airway Placement Confirmation: ETT inserted through vocal cords under direct vision, positive ETCO2 and breath sounds checked- equal and bilateral Secured at: 24 (at the lip) cm Tube secured with: Tape Dental Injury: Teeth and Oropharynx as per pre-operative assessment

## 2024-09-18 NOTE — Op Note (Addendum)
 Operative Note  Preoperative diagnosis:  1.  Localized prostate cancer  Postoperative diagnosis: 1.  Localized prostate cancer  Procedure(s): 1.  Robotic assisted laparoscopic radical prostatectomy (bilateral nerve sparing) 2.  Robotic assisted laparoscopic bilateral pelvic lymph node dissection 3.  Lysis of intestinal and pelvic adhesions for 30 minutes.  Surgeon: Steffan Pea, MD  Assistants:   Gretel Ferrara, MD  An assistant was required for this surgical procedure.  The duties of the assistant included but were not limited to suctioning, passing suture, camera manipulation, retraction.  This procedure would not be able to be performed without an Geophysicist/field seismologist.     Anesthesia:  General  Complications:  None  EBL: 250 cc  Specimens: 1.  Prostate with seminal vesicles 2.  Periprostatic fat 3. Bilateral pelvic lymph nodes  Drains/Catheters: 1.  18 French Foley catheter  Intraoperative findings:   Approximately 60 g prostate with median lobe.  No accessory pudendal vessels.  Successful nerve spare.  Excellent hemostasis.  Water-tight anastomosis without leak. Previous midline incision showed significant abdominal and pelvic adhesions from perforated appendicitis previously extensive lysis of adhesions. Preperitoneal Veress needle resulted in subcutaneous CO2. Anastomosis tested and determined to be watertight no leak evident  Indication:  Jason Williams is a 63 y.o. malewho initially presented with an elevated PSA.  Prostate biopsy showed Gleason 4+ 3 prostate cancer involving the right side of the prostate.  Treatment options were discussed with him at length and he chose robotic assisted laparoscopic radical prostatectomy.  He has grade group 3 and the plan is for bilateral nerve sparing.  Bilateral pelvic lymph node dissection was plan due to his risk stratification.  Description of procedure: The indications, alternatives, benefits, and risks were discussed with the  patient and informed symptoms obtained.  The patient was brought to the operating room table, positioned supine and secured to the bed with a safety strap.  All pressure points were carefully padded and pneumatic compression devices were placed on lower extremities.  After the administration of intravenous antibiotics,  and general endotracheal anesthesia, the patient was repositioned in the dorsal lithotomy position using well-padded Allen stirrups.  The arms were carefully tucked at the patient's side and secured with padding.  The chest was secured in place with foam padding and cloth tape and the table was positioned in approximately 24 degree Trendelenburg.  The patient's abdomen, genitalia, and upper thighs were prepped and draped in the standard sterile manner.  A time was completed, verifying the correct patient, surgical procedure and positioning prior to beginning the procedure.  An 33 French urethral catheter was inserted to drain the bladder.  The patient was then repositioned to level dorsolithotomy using the bed.  An incision was made over Palmer's point and a Veress needle was introduced into the abdomen initial insufflation yielded that this was not in the peritoneum and causing preperitoneal CO2 insufflation.  The Veress needle was repositioned and the abdomen was inflated.  Once we reached a pressure of 15 mm of mercury a 5 mm Visiport was then used to enter the right upper quadrant.  Visualization of the abdomen demonstrated significant adhesions at the midline where the surgery was completed for perforated appendectomy.  We then placed the 8 mm right robotic port 9 centimeters from the midline and the AirSeal at the most lateral aspect in line with the robotic port.  Infection was then transition to air seal and laparoscopic scissors were used to take down the significant adhesions in the midline.SABRA  This took an approximately 30 minutes these included the incisions in the midline as well as  the pelvic adhesions.    Once the adhesions were taken down the midline robotic port was then placed 1 cm above the umbilicus.  Then the other 8 mm robotic ports were placed in the left side of the abdomen at 9 cm from the midline and then an additional port 8 cm lateral from that.  Patient was then put back into Trendelenburg at 24 degrees and the robot was docked placing from left to right progress, bipolar, camera port, monopolar scissors.    The sigmoid colon was then released from its lateral attachments there again were significant attachments on the bilateral sides taking additional time to take down adhesions.  This allowed the colon to be retracted cranially.  Once this dissection was completed.  A 5 cm incision was made 1 cm above the peritoneal reflection dissection was taken through the tissue until did not manage was encountered.  The vas deferens was identified in the right dissected completely and freed around the tip of the central vesicle.  The vas was then ligated and the single vessel was dissected out.  The same exact dissection was taken down the left side as well upon dissection was taken through an obvious towards the prostatic apex.  The urachus and median umbilical ligament was divided and we developed the space of Retzius down to the pubic bone.  I divided the parietal peritoneum laterally up to the vas deferens on each side.  Using the prograsp forcep to provide cranial traction on the urachus, the prostate was then defatted above the prostatic vesicle junction, and the superficial dorsal venous complex was coagulated with bipolar and divided.  We submitted the periprostatic fat for pathological specimen.  The endopelvic fascia was sharply opened bilaterally and the levator muscle fibers were swept posterior laterally allowing for visualization of the deep dorsal venous complex and apex of the prostate.  The puboprostatic ligaments were sharply divided and care was taken to  preserve the dorsal venous complex.  The dorsal venous complex was then stapled using a 45 mm endovascular stapler.   0 degree lens was kept in place. I identified the bladder neck by pulling a Foley catheter.  The fourth arm was applying cranial traction on the urachus.  I divided the anterior bladder neck musculature until I found the anterior bladder neck mucosa which was then incised. I identified the Foley catheter within, the balloon was deflated, we pulled the Foley catheter out into the operating field.  The assistant then used a grasper to apply traction to the Foley catheter and the surgical tech then placed a Hoback on the catheter near the penis for optimal retraction.  I then divided the lateral bladder neck mucosa in the posterior bladder neck mucosa.   I was well away from the bilateral ureteral orifices.  I divided the posterior bladder neck musculature until I discovered the longitudinal fibers and kept dissecting until I found the vas deferens and seminal vesicles.  The vas deferens and seminal vesicles were then pulled through the opening between the bladder and the prostate.    I divided the Denonvilliers fascia beneath the prostate and developed the prostate off the rectum.  This plane was bluntly developed towards the apex of the prostate.  I then addressed the pedicles, first starting with the right and then moving towards the left. I then did a nerve spare by dividing the lateral pelvic fascia  off of the prostatic capsule laterally.  I then isolated the pedicles of the prostate and used the bipolar cautery to burn the pedicles and then divided the pedicles with cold scissors.  I continued to divide the neurovascular bundles off of the prostate out to the apex of the prostate.  At this point the prostate was essentially freed up except for the urethra.   .  The anterior urethra was then sharply divided with cold scissors.  During this process the dorsal venous complex was noted to be  not completely occluded from the staple.  Using an 0 Vicryl on a CT1 the dorsal venous complex was oversewn in a figure-of-eight fashion.  This required 3 sutures to get the patient hemostatic.  The anterior urethra was then incised.  The Foley catheter was then pulled back and we divided the posterior urethral wall. .  The specimen was then placed in a Endo Catch bag and then the bag was placed in the upper abdomen out of the way.  I then irrigated the pelvis.  We performed a rectal test by instilling air into the rectal Foley.  The test was negative.  There is no concern for rectal injury.  And he believes the spot cauterized.  I then performed a bilateral pelvic lymph node dissection by incising the fascia overlying the right external iliac vein, dissecting distally.  I went just distal to the node of Cloquet were replaced clips and then divided the lymphatics.  The lateral aspect of the dissection was the pelvic sidewall, inferior was the obturator nerve and proximal of the hypogastric vessels.  I placed clips at the proximal aspect and then divided the lymphatics.  The specimen was removed with the scope grasper and sent to pathology.  Grossly, there were no enlarged lymph nodes.  This was performed on both the right and left pelvic lymph nodes.  With good hemostasis confirmed, I then did the posterior reconstruction with a Rocco stitch.  I used a 3-0 Vloc double-armed suture on an RB1 needle.  I passed the sutures through the retrotrigonal fascia beneath the bladder on the right side and then to the rectourethralis 4 throws were done on each side, I ran this from right to left.  And then took the other end of the suture passing just proximal to the posterior bladder neck in the midline into the posterior serrated sphincter and around this from right to left using 3 throws and reapproximated the sutures.  I then completed the urethrovesical anastomosis using a 3-0 Vloc suture on an RB1 needle.  I passed  both ends of the suture from the outside in through the bladder neck at the 6 o'clock position.  I passed both through the urethral stump from the inside out and the corresponding position.  I reapproximated the bladder neck to the urethra.  I then ran the left suture on the left side anastomosis to the 9 o'clock position.  And then went back to the right-sided suture around that up the right side of the 12 o'clock position.  I then continued the left suture to the 12 o'clock position. I identified the ureteral orifices and ensured that these were not incorporated with the sutures.  I then placed a new 18 French Foley catheter into the bladder and filled it with 10 cc of sterile water.  I then secured the knot and then passed the suture behind the pubic bone for anterior suspension.  The bladder was irrigated with 200 cc of  water.  There was no leak.  Hemostasis was excellent.  A 19 [place] Blake drain was placed through the previously placed fourth arm.  We did place a Carter-Thomason 0 vicryl suture through the 12 mm assistant port. The robot was undocked and all the trochars were removed under direct vision.    I enlarged the umbilical trocar site large enough to remove the prostate and closed the fascia with 0 PDS sutures in figure-of-eight interrupted fashion.  All the port sites were irrigated.  Exparel  was injected to the trocar sites.  The skin was closed with 4-0 Monocryl in a running subcuticular fashion.  Skin glue was applied.  At this point, the patient was extubated and awakened in the operating room and taken to recovery room in stable condition.  There were no immediate complications.  All counts were correct.  Plan: Admit for observation overnight.  Clear liquids tonight.  Regular for breakfast tomorrow.  Anticipate discharge home tomorrow.  Jackey Pea, MD [facility]

## 2024-09-18 NOTE — Transfer of Care (Signed)
 Immediate Anesthesia Transfer of Care Note  Patient: Jason Williams  Procedure(s) Performed: ROBOTIC ASSISTED LAPAROSCOPIC RADICAL PROSTATECTOMY LYMPHADENECTOMY, PELVIS, ROBOT-ASSISTED  Patient Location: PACU  Anesthesia Type:General  Level of Consciousness: oriented, drowsy, and patient cooperative  Airway & Oxygen Therapy: Patient Spontanous Breathing and Patient connected to face mask oxygen  Post-op Assessment: Report given to RN and Post -op Vital signs reviewed and stable  Post vital signs: Reviewed and stable  Last Vitals:  Vitals Value Taken Time  BP 112/54 09/18/24 13:01  Temp    Pulse 75 09/18/24 13:04  Resp 12 09/18/24 13:04  SpO2 99 % 09/18/24 13:04  Vitals shown include unfiled device data.  Last Pain:  Vitals:   09/18/24 0710  TempSrc: Oral  PainSc:          Complications: No notable events documented.

## 2024-09-18 NOTE — Progress Notes (Signed)
 Night of surgery note  Subjective: The patient is doing well.  No complaints.  Objective: Vital signs in last 24 hours: Temp:  [97.2 F (36.2 C)-97.5 F (36.4 C)] 97.4 F (36.3 C) (10/23 1530) Pulse Rate:  [63-84] 64 (10/23 1630) Resp:  [10-26] 16 (10/23 1630) BP: (104-129)/(54-75) 110/65 (10/23 1530) SpO2:  [97 %-100 %] 97 % (10/23 1630) Weight:  [81.6 kg] 81.6 kg (10/23 0641)  Intake/Output this shift: Total I/O In: 1970 [I.V.:1350; IV Piggyback:620] Out: 400 [Drains:150; Blood:250]  Physical Exam:  General: Alert and oriented. Abdomen: Soft, Nondistended. Incision: Clean with dry, intact dressing.  Lab Results: Recent Labs    09/18/24 1400  HGB 12.5*  HCT 38.3*    Assessment/Plan: 1) Continue to monitor 2) Per orders  Steffan Pea MD.   Steffan JAYSON Pea 09/18/2024, 4:51 PM

## 2024-09-18 NOTE — Anesthesia Postprocedure Evaluation (Signed)
 Anesthesia Post Note  Patient: Jason Williams  Procedure(s) Performed: ROBOTIC ASSISTED LAPAROSCOPIC RADICAL PROSTATECTOMY LYMPHADENECTOMY, PELVIS, ROBOT-ASSISTED     Patient location during evaluation: PACU Anesthesia Type: General Level of consciousness: awake and alert Pain management: pain level controlled Vital Signs Assessment: post-procedure vital signs reviewed and stable Respiratory status: spontaneous breathing, nonlabored ventilation, respiratory function stable and patient connected to nasal cannula oxygen Cardiovascular status: blood pressure returned to baseline and stable Postop Assessment: no apparent nausea or vomiting Anesthetic complications: no   No notable events documented.  Last Vitals:  Vitals:   09/18/24 1415 09/18/24 1430  BP: 106/65 111/64  Pulse: 80 84  Resp: 15 13  Temp:  (!) 36.2 C  SpO2: 100% 99%    Last Pain:  Vitals:   09/18/24 1450  TempSrc:   PainSc: 7                  [name]

## 2024-09-19 ENCOUNTER — Other Ambulatory Visit: Payer: Self-pay | Admitting: Family Medicine

## 2024-09-19 ENCOUNTER — Encounter (HOSPITAL_COMMUNITY): Payer: Self-pay | Admitting: Urology

## 2024-09-19 DIAGNOSIS — C61 Malignant neoplasm of prostate: Secondary | ICD-10-CM | POA: Diagnosis not present

## 2024-09-19 DIAGNOSIS — I10 Essential (primary) hypertension: Secondary | ICD-10-CM

## 2024-09-19 LAB — CBC
HCT: 35.3 % — ABNORMAL LOW (ref 39.0–52.0)
Hemoglobin: 11.6 g/dL — ABNORMAL LOW (ref 13.0–17.0)
MCH: 31.4 pg (ref 26.0–34.0)
MCHC: 32.9 g/dL (ref 30.0–36.0)
MCV: 95.7 fL (ref 80.0–100.0)
Platelets: 162 K/uL (ref 150–400)
RBC: 3.69 MIL/uL — ABNORMAL LOW (ref 4.22–5.81)
RDW: 13.4 % (ref 11.5–15.5)
WBC: 8.2 K/uL (ref 4.0–10.5)
nRBC: 0 % (ref 0.0–0.2)

## 2024-09-19 LAB — BASIC METABOLIC PANEL WITH GFR
Anion gap: 10 (ref 5–15)
BUN: 12 mg/dL (ref 8–23)
CO2: 22 mmol/L (ref 22–32)
Calcium: 8.1 mg/dL — ABNORMAL LOW (ref 8.9–10.3)
Chloride: 106 mmol/L (ref 98–111)
Creatinine, Ser: 0.86 mg/dL (ref 0.61–1.24)
GFR, Estimated: >60 mL/min (ref >60)
Glucose, Bld: 120 mg/dL — ABNORMAL HIGH (ref 70–99)
Potassium: 4.5 mmol/L (ref 3.5–5.1)
Sodium: 138 mmol/L (ref 135–145)

## 2024-09-19 LAB — HEMOGLOBIN AND HEMATOCRIT, BLOOD
HCT: 34.5 % — ABNORMAL LOW (ref 39.0–52.0)
Hemoglobin: 11.3 g/dL — ABNORMAL LOW (ref 13.0–17.0)

## 2024-09-19 LAB — CREATININE, FLUID (PLEURAL, PERITONEAL, JP DRAINAGE): Creat, Fluid: 0.9 mg/dL

## 2024-09-19 MED ORDER — SULFAMETHOXAZOLE-TRIMETHOPRIM 800-160 MG PO TABS: 1.0000 | ORAL_TABLET | Freq: Two times a day (BID) | ORAL | 0 refills | Status: AC

## 2024-09-19 MED ORDER — OXYCODONE-ACETAMINOPHEN 10-325 MG PO TABS: 1.0000 | ORAL_TABLET | Freq: Four times a day (QID) | ORAL | 0 refills | Status: AC | PRN

## 2024-09-19 MED ORDER — METHOCARBAMOL 750 MG PO TABS: 750.0000 mg | ORAL_TABLET | Freq: Four times a day (QID) | ORAL | 0 refills | Status: AC

## 2024-09-19 MED ORDER — SIMETHICONE 80 MG PO CHEW
80.0000 mg | CHEWABLE_TABLET | Freq: Once | ORAL | Status: AC
  Administered 2024-09-19: 80 mg via ORAL
  Filled 2024-09-19: qty 1

## 2024-09-19 MED ORDER — FLEET ENEMA RE ENEM: 1.0000 | ENEMA | Freq: Once | RECTAL | Status: DC

## 2024-09-19 MED ORDER — POLYETHYLENE GLYCOL 3350 17 G PO PACK: 17.0000 g | PACK | Freq: Every day | ORAL | 0 refills | Status: AC

## 2024-09-19 MED ORDER — HEPARIN SODIUM (PORCINE) 5000 UNIT/ML IJ SOLN
5000.0000 [IU] | Freq: Three times a day (TID) | INTRAMUSCULAR | Status: DC
  Administered 2024-09-19: 5000 [IU] via SUBCUTANEOUS
  Filled 2024-09-19: qty 1

## 2024-09-19 MED ORDER — KETOROLAC TROMETHAMINE 15 MG/ML IJ SOLN
15.0000 mg | Freq: Four times a day (QID) | INTRAMUSCULAR | Status: DC
  Administered 2024-09-19 (×2): 15 mg via INTRAVENOUS
  Filled 2024-09-19 (×2): qty 1

## 2024-09-19 MED ORDER — MAGNESIUM CITRATE PO SOLN
1.0000 | Freq: Once | ORAL | Status: DC
Start: 2024-09-19 — End: 2024-09-19

## 2024-09-19 MED ORDER — ALUM & MAG HYDROXIDE-SIMETH 200-200-20 MG/5ML PO SUSP
30.0000 mL | Freq: Once | ORAL | Status: DC
  Filled 2024-09-19: qty 30

## 2024-09-19 MED ORDER — SENNA 8.6 MG PO TABS: 1.0000 | ORAL_TABLET | Freq: Every day | ORAL | 0 refills | Status: AC

## 2024-09-19 MED ORDER — CHLORHEXIDINE GLUCONATE CLOTH 2 % EX PADS
6.0000 | MEDICATED_PAD | Freq: Every day | CUTANEOUS | Status: DC
  Administered 2024-09-19: 6 via TOPICAL

## 2024-09-19 NOTE — Progress Notes (Signed)
 1 Day Post-Op Subjective: Not passing gas, ambulating, pain controlled. No N/V. UOP adequate.   Objective: Vital signs in last 24 hours: Temp:  [97.2 F (36.2 C)-98.8 F (37.1 C)] 98.8 F (37.1 C) (10/24 0459) Pulse Rate:  [63-90] 79 (10/24 0459) Resp:  [10-26] 16 (10/24 0459) BP: (104-135)/(54-75) 132/65 (10/24 0459) SpO2:  [97 %-100 %] 100 % (10/24 0459)  Intake/Output from previous day: 10/23 0701 - 10/24 0700 In: 3849.9 [P.O.:720; I.V.:2409.9; IV Piggyback:720] Out: 2870 [Urine:2325; Drains:295; Blood:250] Intake/Output this shift: Total I/O In: 1609.8 [P.O.:480; I.V.:1029.8; IV Piggyback:100] Out: 2035 [Urine:1950; Drains:85]  Physical Exam:  General: Alert and oriented CV: RRR Lungs: Clear Abdomen: Soft, ND, ATTP; inc c/d/I, some echimosis near the abdomen. Drain in place with serosanguinous output.  GU: catheter in place with clear yellow urine.   Lab Results: Recent Labs    09/18/24 1400 09/19/24 0345  HGB 12.5* 11.6*  HCT 38.3* 35.3*   BMET Recent Labs    09/18/24 1400 09/19/24 0345  NA 139 138  K 4.0 4.5  CL 104 106  CO2 24 22  GLUCOSE 165* 120*  BUN 15 12  CREATININE 1.13 0.86  CALCIUM  8.4* 8.1*     Studies/Results: No results found.  Assessment/Plan: # PCa s/p RALP - advance to regular diet - pain management, start toradol  continue opiod pain meds - continue bowel regimen  - H&H at lunch  - fluid creatinine if normal remove drain later today  - possible DC later today     LOS: 0 days   Jackey Pea MD 09/19/2024, 6:55 AM [facility]

## 2024-09-19 NOTE — Progress Notes (Signed)
 Pt ambulated around unit with NA.  Pt tolerated it well.

## 2024-09-19 NOTE — Plan of Care (Signed)
   Problem: Education: Goal: Knowledge of the procedure and recovery process will improve Outcome: Progressing   Problem: Bowel/Gastric: Goal: Gastrointestinal status for postoperative course will improve Outcome: Progressing   Problem: Pain Management: Goal: General experience of comfort will improve Outcome: Progressing   Problem: Skin Integrity: Goal: Demonstration of wound healing without infection will improve Outcome: Progressing   Problem: Urinary Elimination: Goal: Ability to avoid or minimize complications of infection will improve Outcome: Progressing Goal: Ability to achieve and maintain urine output will improve Outcome: Progressing Goal: Home care management will improve Outcome: Progressing   Problem: Education: Goal: Knowledge of General Education information will improve Description: Including pain rating scale, medication(s)/side effects and non-pharmacologic comfort measures Outcome: Progressing   Problem: Health Behavior/Discharge Planning: Goal: Ability to manage health-related needs will improve Outcome: Progressing   Problem: Clinical Measurements: Goal: Ability to maintain clinical measurements within normal limits will improve Outcome: Progressing Goal: Will remain free from infection Outcome: Progressing Goal: Diagnostic test results will improve Outcome: Progressing Goal: Respiratory complications will improve Outcome: Progressing Goal: Cardiovascular complication will be avoided Outcome: Progressing   Problem: Activity: Goal: Risk for activity intolerance will decrease Outcome: Progressing   Problem: Nutrition: Goal: Adequate nutrition will be maintained Outcome: Progressing   Problem: Coping: Goal: Level of anxiety will decrease Outcome: Progressing   Problem: Elimination: Goal: Will not experience complications related to bowel motility Outcome: Progressing Goal: Will not experience complications related to urinary  retention Outcome: Progressing   Problem: Pain Managment: Goal: General experience of comfort will improve and/or be controlled Outcome: Progressing   Problem: Safety: Goal: Ability to remain free from injury will improve Outcome: Progressing   Problem: Skin Integrity: Goal: Risk for impaired skin integrity will decrease Outcome: Progressing

## 2024-09-19 NOTE — Discharge Summary (Signed)
 Date of admission: 09/18/2024  Date of discharge: 09/19/2024  Admission diagnosis: Prostate Cancer  Discharge diagnosis: Same   Secondary diagnoses:  Patient Active Problem List   Diagnosis Date Noted   Prostate cancer (HCC) 09/18/2024   Ear pain, bilateral 07/18/2024   Hyperglycemia 04/11/2024   Dizziness 04/11/2024   Bilateral hip pain 03/19/2023   Cervical spondylosis with myelopathy and radiculopathy 02/28/2023   Suspicious nevus 11/09/2022   Chronic pain syndrome 11/09/2022   Excessive urination at night 04/28/2022   Statin intolerance 07/12/2021   Primary osteoarthritis of right knee 07/12/2021   Acute pain of right knee 12/22/2020   Preventative health care 09/15/2019   Screening for HIV without presence of risk factors 09/15/2019   Encounter for hepatitis C screening test for low risk patient 09/15/2019   BPH (benign prostatic hyperplasia) 03/31/2016   GERD (gastroesophageal reflux disease) 03/18/2015   ED 10/07/2014   Chest pain 03/20/2014   Carpal tunnel syndrome 12/28/2011   Primary hypertension 01/24/2011   SNORING 01/02/2011   Hyperlipidemia 11/01/2010   Anxiety state 11/01/2010   PLANTAR FASCIITIS, BILATERAL 08/15/2010   PAIN IN JOINT, ANKLE AND FOOT 03/10/2010   Episodic mood disorder 01/06/2010   Insomnia 01/24/2007    Procedures performed: Procedure(s): ROBOTIC ASSISTED LAPAROSCOPIC RADICAL PROSTATECTOMY LYMPHADENECTOMY, PELVIS, ROBOT-ASSISTED  History and Physical: For full details, please see admission history and physical. Briefly, Jason Williams is a 63 y.o. 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 Jason Williams   Hospital Course: Patient tolerated the procedure well.  He was then transferred to the floor after an uneventful PACU stay.  His hospital course was uncomplicated.  On POD#1 he had met discharge criteria: was eating a regular diet, was up and ambulating independently,  pain was well controlled, tolerating catheter, passing gas, and  was ready to for discharge  Labs WNL.   PT did have increased pain management due to his tramadol  use at home.    Laboratory values:  Recent Labs    09/18/24 1400 09/19/24 0345 09/19/24 1147  WBC 11.5* 8.2  --   HGB 12.5* 11.6* 11.3*  HCT 38.3* 35.3* 34.5*   Recent Labs    09/18/24 1400 09/19/24 0345  NA 139 138  K 4.0 4.5  CL 104 106  CO2 24 22  GLUCOSE 165* 120*  BUN 15 12  CREATININE 1.13 0.86  CALCIUM  8.4* 8.1*   No results for input(s): LABPT, INR in the last 72 hours. No results for input(s): LABURIN in the last 72 hours. Results for orders placed or performed during the hospital encounter of 09/04/24  Surgical pcr screen     Status: None   Collection Time: 09/04/24  9:51 AM   Specimen: Nasal Mucosa; Nasal Swab  Result Value Ref Range Status   MRSA, PCR NEGATIVE NEGATIVE Final   Staphylococcus aureus NEGATIVE NEGATIVE Final    Comment: (NOTE) The Xpert SA Assay (FDA approved for NASAL specimens in patients 38 years of age and older), is one component of a comprehensive surveillance program. It is not intended to diagnose infection nor to guide or monitor treatment. Performed at [facility], [address], [place], [place] [address]     Disposition: Home  Discharge instruction: The patient was instructed to be ambulatory but told to refrain from heavy lifting, strenuous activity, or driving.   Discharge medications:  See DC med rec   Followup:   Follow-up Information     Claudene Waddell HERO, PA-C Follow up.  Why: you will be called to set up f/u in 7-10 days to remove the catheter Contact information: [address] [place] [address] [phone]

## 2024-09-19 NOTE — Progress Notes (Signed)
   09/19/24 0844  TOC Brief Assessment  Insurance and Status Reviewed  Patient has primary care physician Yes  Home environment has been reviewed Resides alone in single family home  Prior level of function: Independent with ADLs at baseline  Prior/Current Home Services No current home services  Social Drivers of Health Review SDOH reviewed no interventions necessary  Readmission risk has been reviewed Yes  Transition of care needs no transition of care needs at this time

## 2024-09-19 NOTE — Progress Notes (Signed)
 Patient received discharge orders to go home with Foley Catheter. Patient was given discharge paperwork/instructions, as well as Foley Catheter supplies and dressing supplies. RN went over discharge instructions/paperwork with the patient, as well as Foley Leg Bag teaching and switching from Standard Foley Bag to Foley Leg Bag and vice versa. RN also went over Foley Catheter care with the patient. Any questions/concerns were addressed/answered during this time to the best of RN's ability. Patient left the hospital stable with Foley Catheter, had discharge instructions/paperwork, had Foley Catheter supplies, had dressing supplies, and had all personal belongings.

## 2024-09-19 NOTE — Discharge Instructions (Addendum)
 Activity:  You are encouraged to ambulate frequently (about every hour during waking hours) to help prevent blood clots from forming in your legs or lungs.  However, you should not engage in any heavy lifting (> 10-15 lbs), strenuous activity, or straining. Diet: You should continue a clear liquid diet until passing gas from below.  Once this occurs, you may advance your diet to a soft diet that would be easy to digest (i.e soups, scrambled eggs, mashed potatoes, etc.) for 24 hours just as you would if getting over a bad stomach flu.  If tolerating this diet well for 24 hours, you may then begin eating regular food.  It will be normal to have some amount of bloating, nausea, and abdominal discomfort intermittently. Prescriptions:  You will be provided a prescription for pain medication to take as needed.  If your pain is not severe enough to require the prescription pain medication, you may take extra strength Tylenol  instead.  You should also take an over the counter stool softener (Colace 100 mg twice daily) to avoid straining with bowel movements as the pain medication may constipate you. Finally, you will also be provided a prescription for an antibiotic to begin the day prior to your return visit in the office for catheter removal. Catheter care: You will be taught how to take care of the catheter by the nursing staff prior to discharge from the hospital.  You may use both a leg bag and the larger bedside bag but it is recommended to at least use the bigger bedside bag at nighttime as the leg bag is small and will fill up overnight and also does not drain as well when lying flat. You may periodically feel a strong urge to void with the catheter in place.  This is a bladder spasm and most often can occur when having a bowel movement or when you are moving around. It is typically self-limited and usually will stop after a few minutes.  You may use some Vaseline or Neosporin around the tip of the catheter to  reduce friction at the tip of the penis. Incisions: You may remove your dressing bandages the 2nd day after surgery.  You most likely will have a few small staples in each of the incisions and once the bandages are removed, the incisions may stay open to air.  You may start showering (not soaking or bathing in water) 48 hours after surgery and the incisions simply need to be patted dry after the shower.  No additional care is needed. What to call us  about: You should call the office [phone]) if you develop fever > 101, persistent vomiting, or the catheter stops draining. Also, feel free to call with any other questions you may have and remember the handout that was provided to you as a reference preoperatively which answers many of the common questions that arise after surgery.  Foley Catheter Care A soft, flexible tube (Foley catheter) may have been placed in your bladder to drain urine and fluid. Follow these instructions: Taking Care of the Catheter Keep the area where the catheter leaves your body clean.  Attach the catheter to the leg so there is no tension on the catheter.  Keep the drainage bag below the level of the bladder, but keep it OFF the floor.  Do not take long soaking baths. Your caregiver will give instructions about showering.  Wash your hands before touching ANYTHING related to the catheter or bag.  Using mild soap and warm  water on a washcloth:  Clean the area closest to the catheter insertion site using a circular motion around the catheter.  Clean the catheter itself by wiping AWAY from the insertion site for several inches down the tube.  NEVER wipe upward as this could sweep bacteria up into the urethra (tube in your body that normally drains the bladder) and cause infection.  Place a small amount of sterile lubricant at the tip of the penis where the catheter is entering.  Taking Care of the Drainage Bags Two drainage bags may be taken home: a large overnight  drainage bag, and a smaller leg bag which fits underneath clothing.  It is okay to wear the overnight bag at any time, but NEVER wear the smaller leg bag at night.  Keep the drainage bag well below the level of your bladder. This prevents backflow of urine into the bladder and allows the urine to drain freely.  Anchor the tubing to your leg to prevent pulling or tension on the catheter. Use tape or a leg strap provided by the hospital.  Empty the drainage bag when it is 1/2 to 3/4 full. Wash your hands before and after touching the bag.  Periodically check the tubing for kinks to make sure there is no pressure on the tubing which could restrict the flow of urine.  Changing the Drainage Bags Cleanse both ends of the clean bag with alcohol before changing.  Pinch off the rubber catheter to avoid urine spillage during the disconnection.  Disconnect the dirty bag and connect the clean one.  Empty the dirty bag carefully to avoid a urine spill.  Attach the new bag to the leg with tape or a leg strap.  Cleaning the Drainage Bags Whenever a drainage bag is disconnected, it must be cleaned quickly so it is ready for the next use.  Wash the bag in warm, soapy water.  Rinse the bag thoroughly with warm water.  Soak the bag for 30 minutes in a solution of white vinegar and water (1 cup vinegar to 1 quart warm water).  Rinse with warm water.   IT Normal To See Some blood in the urine is normal, as long as you can see your fingers through the catheter tubing (not the bag) this is ok. As long as the urine is not the consistency of tomato paste.  It will be normal to feel the urge to urinate often this is a side effect of the catheter  Some discharge around the catheter where it exits the penis is normal  SEEK MEDICAL CARE IF:  You have chills or night sweats.  You are leaking around your catheter or have problems with your catheter. It is not uncommon to have sporadic leakage around your catheter as a  result of bladder spasms. If the leakage stops, there is not much need for concern. If you are uncertain, call your caregiver.  You develop side effects that you think are coming from your medicines.  SEEK IMMEDIATE MEDICAL CARE IF:  You are suddenly unable to urinate. Check to see if there are any kinks in the drainage tubing that may cause this. If you cannot find any kinks, call your caregiver immediately. This is an emergency.  You develop shortness of breath or chest pains.  Bleeding persists or clots develop in your urine.  You have a fever.  You develop pain in your back or over your lower belly (abdomen).  You develop pain or swelling in your legs.  Any problems you are having get worse rather than better.  MAKE SURE YOU:  Understand these instructions.  Will watch your condition.  Will get help right away if you are not doing well or get worse.

## 2024-09-20 ENCOUNTER — Other Ambulatory Visit: Payer: Self-pay | Admitting: Family Medicine

## 2024-09-20 DIAGNOSIS — M25551 Pain in right hip: Secondary | ICD-10-CM

## 2024-09-20 DIAGNOSIS — F419 Anxiety disorder, unspecified: Secondary | ICD-10-CM

## 2024-09-20 DIAGNOSIS — Z0000 Encounter for general adult medical examination without abnormal findings: Secondary | ICD-10-CM

## 2024-09-22 NOTE — Telephone Encounter (Signed)
 Requesting: tramadol  and diazepam   Contract:03/19/23 UDS: 03/19/23 Last Visit: 07/18/24 Next Visit: None Last Refill: see med list   Please Advise

## 2024-09-23 LAB — SURGICAL PATHOLOGY

## 2024-09-24 DIAGNOSIS — C61 Malignant neoplasm of prostate: Secondary | ICD-10-CM | POA: Diagnosis not present

## 2024-09-29 ENCOUNTER — Encounter: Payer: Self-pay | Admitting: Radiology

## 2024-09-29 DIAGNOSIS — C61 Malignant neoplasm of prostate: Secondary | ICD-10-CM | POA: Diagnosis not present

## 2024-10-07 ENCOUNTER — Other Ambulatory Visit: Payer: Self-pay | Admitting: Family Medicine

## 2024-10-07 ENCOUNTER — Telehealth: Payer: Self-pay

## 2024-10-07 DIAGNOSIS — M25552 Pain in left hip: Secondary | ICD-10-CM

## 2024-10-07 NOTE — Telephone Encounter (Signed)
 Copied from CRM [id number]. Topic: Clinical - Request for Lab/Test Order >> Oct 07, 2024 12:25 PM Rea C wrote: Reason for CRM: Patient called in and is requesting a referral for an MRI due to hip issues. Patient said that provider is aware of previous surgery and what is going on with hip joint.   [phone] (M)

## 2024-10-07 NOTE — Telephone Encounter (Addendum)
 MRIs ordered by Dr. Antonio.

## 2024-10-08 ENCOUNTER — Encounter: Payer: Self-pay | Admitting: Family Medicine

## 2024-10-09 ENCOUNTER — Ambulatory Visit
Admission: RE | Admit: 2024-10-09 | Discharge: 2024-10-09 | Disposition: A | Source: Ambulatory Visit | Attending: Family Medicine | Admitting: Family Medicine

## 2024-10-09 ENCOUNTER — Ambulatory Visit
Admission: RE | Admit: 2024-10-09 | Discharge: 2024-10-09 | Disposition: A | Discharge status: Home / Self Care | Source: Ambulatory Visit | Attending: Family Medicine | Admitting: Family Medicine

## 2024-10-09 ENCOUNTER — Ambulatory Visit: Payer: Self-pay | Admitting: Family Medicine

## 2024-10-09 DIAGNOSIS — M25551 Pain in right hip: Secondary | ICD-10-CM

## 2024-10-09 DIAGNOSIS — S73192A Other sprain of left hip, initial encounter: Secondary | ICD-10-CM | POA: Diagnosis not present

## 2024-10-10 DIAGNOSIS — N393 Stress incontinence (female) (male): Secondary | ICD-10-CM | POA: Diagnosis not present

## 2024-10-10 DIAGNOSIS — C61 Malignant neoplasm of prostate: Secondary | ICD-10-CM | POA: Diagnosis not present

## 2024-10-15 ENCOUNTER — Other Ambulatory Visit: Payer: Self-pay

## 2024-10-15 ENCOUNTER — Ambulatory Visit (INDEPENDENT_AMBULATORY_CARE_PROVIDER_SITE_OTHER): Admitting: Orthopaedic Surgery

## 2024-10-15 DIAGNOSIS — M25552 Pain in left hip: Secondary | ICD-10-CM | POA: Diagnosis not present

## 2024-10-15 DIAGNOSIS — M25551 Pain in right hip: Secondary | ICD-10-CM | POA: Diagnosis not present

## 2024-10-15 MED ORDER — LIDOCAINE HCL 1 % IJ SOLN
3.0000 mL | INTRAMUSCULAR | Status: AC | PRN
  Administered 2024-10-15: 3 mL

## 2024-10-15 MED ORDER — METHYLPREDNISOLONE ACETATE 40 MG/ML IJ SUSP
40.0000 mg | INTRAMUSCULAR | Status: AC | PRN
  Administered 2024-10-15: 40 mg via INTRA_ARTICULAR

## 2024-10-15 MED ORDER — BUPIVACAINE HCL 0.5 % IJ SOLN
3.0000 mL | INTRAMUSCULAR | Status: AC | PRN
  Administered 2024-10-15: 3 mL via INTRA_ARTICULAR

## 2024-10-15 NOTE — Progress Notes (Signed)
 Office Visit Note   Patient: Jason Williams           Date of Birth: [birth year]           MRN: [id number] Visit Date: 10/15/2024              Requested by: [address], [place], [place] [address] [place],  [place] [address] PCP: Antonio Meth, Jamee SAUNDERS, DO   Assessment & Plan: Visit Diagnoses:  1. Bilateral hip pain     Plan: History of Present Illness Jason Williams is a 63 year old male who presents with chronic left hip pain.  He has experienced left hip pain for two and a half years, localized to the lateral aspect over the bone. The pain is constant and severe, reaching nine out of ten during flare-ups. It worsens with walking, limiting his ability to walk more than half a mile. He previously walked five miles without issue. There is no groin pain, but there is less bothersome tenderness on the right side.  He has not received prior treatments such as injections or physical therapy for the hip pain. X-rays from two years ago showed bilateral hip degeneration. He temporarily alleviates the pain by pressing and rubbing the area.  His past medical history includes prostate surgery, neck fusion surgery, and knee surgery with multiple cortisone injections in the knee providing minimal relief. The hip pain prevents him from walking and playing golf, activities he previously enjoyed without pain. He denies any other associated symptoms.  Physical Exam MUSCULOSKELETAL: Hip movement does not cause pain. Left trochanteric hip pain.  Results RADIOLOGY Hip MRI: No findings to explain pain; fluid collections inside pelvis related to prostate surgery  Assessment and Plan Left trochanteric hip pain.  Discussed potential causes. MRI negative for structural abnormalities. Differential includes tendinitis or bursitis not visible on MRI. Previous cortisone injections provided temporary relief. - Administered cortisone injection to the left trochanteric bursa. - Initiated physical  therapy to address muscular imbalances or weaknesses. - discussed seeing PCP to be evaluated for fibromyalgia.  Total face to face encounter time was greater than 25 minutes and over half of this time was spent in counseling and/or coordination of care.  Follow-Up Instructions: No follow-ups on file.   Orders:  Orders Placed This Encounter  Procedures   Large Joint Inj: L greater trochanter   Ambulatory referral to Physical Therapy   No orders of the defined types were placed in this encounter.     Procedures: Large Joint Inj: L greater trochanter on 10/15/2024 8:16 AM Indications: pain Details: 22 G needle Medications: 3 mL lidocaine  1 %; 3 mL bupivacaine  0.5 %; 40 mg methylPREDNISolone  acetate 40 MG/ML Outcome: tolerated well, no immediate complications Patient was prepped and draped in the usual sterile fashion.       Clinical Data: No additional findings.   Subjective: Chief Complaint  Patient presents with   Right Hip - Pain   Left Hip - Pain    HPI  Review of Systems  Constitutional: Negative.   HENT: Negative.    Eyes: Negative.   Respiratory: Negative.    Cardiovascular: Negative.   Gastrointestinal: Negative.   Endocrine: Negative.   Genitourinary: Negative.   Skin: Negative.   Allergic/Immunologic: Negative.   Neurological: Negative.   Hematological: Negative.   Psychiatric/Behavioral: Negative.    All other systems reviewed and are negative.    Objective: Vital Signs: There were no vitals taken for this visit.  Physical Exam Vitals and nursing note reviewed.  Constitutional:      Appearance: He is well-developed.  HENT:     Head: Normocephalic and atraumatic.  Eyes:     Pupils: Pupils are equal, round, and reactive to light.  Pulmonary:     Effort: Pulmonary effort is normal.  Abdominal:     Palpations: Abdomen is soft.  Musculoskeletal:        General: Normal range of motion.     Cervical back: Neck supple.  Skin:    General:  Skin is warm.  Neurological:     Mental Status: He is alert and oriented to person, place, and time.  Psychiatric:        Behavior: Behavior normal.        Thought Content: Thought content normal.        Judgment: Judgment normal.     Ortho Exam  Specialty Comments:  No specialty comments available.  Imaging: No results found.   PMFS History: Patient Active Problem List   Diagnosis Date Noted   Prostate cancer (HCC) 09/18/2024   Ear pain, bilateral 07/18/2024   Hyperglycemia 04/11/2024   Dizziness 04/11/2024   Bilateral hip pain 03/19/2023   Cervical spondylosis with myelopathy and radiculopathy 02/28/2023   Suspicious nevus 11/09/2022   Chronic pain syndrome 11/09/2022   Excessive urination at night 04/28/2022   Statin intolerance 07/12/2021   Primary osteoarthritis of right knee 07/12/2021   Acute pain of right knee 12/22/2020   Preventative health care 09/15/2019   Screening for HIV without presence of risk factors 09/15/2019   Encounter for hepatitis C screening test for low risk patient 09/15/2019   BPH (benign prostatic hyperplasia) 03/31/2016   GERD (gastroesophageal reflux disease) 03/18/2015   ED 10/07/2014   Chest pain 03/20/2014   Carpal tunnel syndrome 12/28/2011   Primary hypertension 01/24/2011   SNORING 01/02/2011   Hyperlipidemia 11/01/2010   Anxiety state 11/01/2010   PLANTAR FASCIITIS, BILATERAL 08/15/2010   PAIN IN JOINT, ANKLE AND FOOT 03/10/2010   Episodic mood disorder 01/06/2010   Insomnia 01/24/2007   Past Medical History:  Diagnosis Date   Anxiety    Arthritis    Cancer (HCC)    prostate cancer   Depression    GERD (gastroesophageal reflux disease)    Headache    Heart murmur    as child   History of MRSA infection    toe on right foot   Hyperlipidemia    Hypertension    Sleep apnea    no CPAP  lost weight    Family History  Problem Relation Age of Onset   Esophageal cancer Mother    Heart attack Father        Died of  MI at 21   Mental illness Father    Healthy Sister    Healthy Sister    Heart disease Maternal Grandfather        MI/CABG   Heart attack Maternal Grandfather    Colon cancer Neg Hx    Rectal cancer Neg Hx    Stomach cancer Neg Hx     Past Surgical History:  Procedure Laterality Date   ankle re construction Bilateral 11/27/2009   cadaver ligament used, no hardware per patient, by Dr. Beverley   ANTERIOR CERVICAL DECOMP/DISCECTOMY FUSION N/A 02/28/2023   Procedure: Anterior Cervical Decompression/Discectomy Fusion, Interbody Prosthesis, Plate/Screws Cervical Three-Cervical Four, Cervical Four-Cervical Five, Cervical Five- Cervical Six;  Surgeon: Mavis Purchase, MD;  Location: [facility] OR;  Service: Neurosurgery;  Laterality: N/A;  3C   APPENDECTOMY  11/28/1979   exploratory surgery d/t appendix bursting   EYE SURGERY Bilateral    lasik surgery   ROBOT ASSISTED LAPAROSCOPIC RADICAL PROSTATECTOMY N/A 09/18/2024   Procedure: ROBOTIC ASSISTED LAPAROSCOPIC RADICAL PROSTATECTOMY;  Surgeon: Shane Steffan BROCKS, MD;  Location: [facility] ORS;  Service: Urology;  Laterality: N/A;   WISDOM TOOTH EXTRACTION     Social History   Occupational History   Occupation: Garment/textile Technologist: [employer]  Tobacco Use   Smoking status: Never   Smokeless tobacco: Never  Vaping Use   Vaping status: Never Used  Substance and Sexual Activity   Alcohol use: Yes    Alcohol/week: 2.0 standard drinks of alcohol    Types: 2 Glasses of wine per week   Drug use: No   Sexual activity: Yes    Birth control/protection: Pill, None    Comment: 1 male

## 2024-10-16 DIAGNOSIS — N393 Stress incontinence (female) (male): Secondary | ICD-10-CM | POA: Diagnosis not present

## 2024-10-20 ENCOUNTER — Other Ambulatory Visit: Payer: Self-pay | Admitting: Family Medicine

## 2024-10-20 ENCOUNTER — Other Ambulatory Visit: Payer: Self-pay | Admitting: Family

## 2024-10-20 DIAGNOSIS — Z0000 Encounter for general adult medical examination without abnormal findings: Secondary | ICD-10-CM

## 2024-10-20 DIAGNOSIS — F419 Anxiety disorder, unspecified: Secondary | ICD-10-CM

## 2024-10-20 DIAGNOSIS — M25551 Pain in right hip: Secondary | ICD-10-CM

## 2024-10-20 MED ORDER — TRAMADOL HCL 50 MG PO TABS: 50.0000 mg | ORAL_TABLET | Freq: Four times a day (QID) | ORAL | 0 refills | Status: DC | PRN

## 2024-10-20 MED ORDER — DIAZEPAM 10 MG PO TABS: 10.0000 mg | ORAL_TABLET | Freq: Three times a day (TID) | ORAL | 1 refills | Status: DC

## 2024-10-20 NOTE — Telephone Encounter (Signed)
 Requesting: tramadol  and diazepam   Contract:03/19/23 UDS: 03/19/23 Last Visit: 07/18/24 Next Visit: None Last Refill: see med list   Please Advise

## 2024-10-20 NOTE — Telephone Encounter (Signed)
 Copied from CRM #[id number]. Topic: Clinical - Medication Refill >> Oct 20, 2024  1:58 PM China J wrote: Medication:  diazepam  (VALIUM ) 10 MG tablet traMADol  (ULTRAM ) 50 MG tablet  Has the patient contacted their pharmacy? Yes (Agent: If no, request that the patient contact the pharmacy for the refill. If patient does not wish to contact the pharmacy document the reason why and proceed with request.) (Agent: If yes, when and what did the pharmacy advise?) Pharmacy said call primary.  This is the patient's preferred pharmacy:  [id number] [place], [place] - [address] [place] [address] Phone: [phone] Fax: [phone]  Is this the correct pharmacy for this prescription? Yes If no, delete pharmacy and type the correct one.   Has the prescription been filled recently? No  Is the patient out of the medication? Yes  Has the patient been seen for an appointment in the last year OR does the patient have an upcoming appointment? Yes  Can we respond through MyChart? Yes  Agent: Please be advised that Rx refills may take up to 3 business days. We ask that you follow-up with your pharmacy.

## 2024-10-28 DIAGNOSIS — N393 Stress incontinence (female) (male): Secondary | ICD-10-CM | POA: Diagnosis not present

## 2024-10-28 DIAGNOSIS — M62838 Other muscle spasm: Secondary | ICD-10-CM | POA: Diagnosis not present

## 2024-10-28 DIAGNOSIS — M6281 Muscle weakness (generalized): Secondary | ICD-10-CM | POA: Diagnosis not present

## 2024-10-31 DIAGNOSIS — M6281 Muscle weakness (generalized): Secondary | ICD-10-CM | POA: Diagnosis not present

## 2024-10-31 DIAGNOSIS — N393 Stress incontinence (female) (male): Secondary | ICD-10-CM | POA: Diagnosis not present

## 2024-10-31 DIAGNOSIS — M62838 Other muscle spasm: Secondary | ICD-10-CM | POA: Diagnosis not present

## 2024-11-12 DIAGNOSIS — C61 Malignant neoplasm of prostate: Secondary | ICD-10-CM | POA: Diagnosis not present

## 2024-11-17 NOTE — Therapy (Addendum)
 " OUTPATIENT PHYSICAL THERAPY LOWER EXTREMITY EVALUATION   Patient Name: Jason Williams MRN: [id number] DOB:1961/06/03, 63 y.o., male Today's Date: 11/26/2024   END OF SESSION:  PT End of Session - 11/26/24 0803     Visit Number 1    Date for Recertification  01/21/25    Authorization Type BCBS    PT Start Time 0803    PT Stop Time 0849    PT Time Calculation (min) 46 min    Activity Tolerance Patient tolerated treatment well    Behavior During Therapy [facility] for tasks assessed/performed          Past Medical History:  Diagnosis Date   Anxiety    Arthritis    Cancer (HCC)    prostate cancer   Depression    GERD (gastroesophageal reflux disease)    Headache    Heart murmur    as child   History of MRSA infection    toe on right foot   Hyperlipidemia    Hypertension    Sleep apnea    no CPAP  lost weight   Past Surgical History:  Procedure Laterality Date   ankle re construction Bilateral 11/27/2009   cadaver ligament used, no hardware per patient, by Dr. Beverley   ANTERIOR CERVICAL DECOMP/DISCECTOMY FUSION N/A 02/28/2023   Procedure: Anterior Cervical Decompression/Discectomy Fusion, Interbody Prosthesis, Plate/Screws Cervical Three-Cervical Four, Cervical Four-Cervical Five, Cervical Five- Cervical Six;  Surgeon: Mavis Purchase, MD;  Location: [facility] OR;  Service: Neurosurgery;  Laterality: N/A;  3C   APPENDECTOMY  11/28/1979   exploratory surgery d/t appendix bursting   EYE SURGERY Bilateral    lasik surgery   ROBOT ASSISTED LAPAROSCOPIC RADICAL PROSTATECTOMY N/A 09/18/2024   Procedure: ROBOTIC ASSISTED LAPAROSCOPIC RADICAL PROSTATECTOMY;  Surgeon: Shane Steffan BROCKS, MD;  Location: [facility] ORS;  Service: Urology;  Laterality: N/A;   WISDOM TOOTH EXTRACTION     Patient Active Problem List   Diagnosis Date Noted   Prostate cancer (HCC) 09/18/2024   Ear pain, bilateral 07/18/2024   Hyperglycemia 04/11/2024   Dizziness 04/11/2024   Bilateral hip pain 03/19/2023    Cervical spondylosis with myelopathy and radiculopathy 02/28/2023   Suspicious nevus 11/09/2022   Chronic pain syndrome 11/09/2022   Excessive urination at night 04/28/2022   Statin intolerance 07/12/2021   Primary osteoarthritis of right knee 07/12/2021   Acute pain of right knee 12/22/2020   Preventative health care 09/15/2019   Screening for HIV without presence of risk factors 09/15/2019   Encounter for hepatitis C screening test for low risk patient 09/15/2019   BPH (benign prostatic hyperplasia) 03/31/2016   GERD (gastroesophageal reflux disease) 03/18/2015   ED 10/07/2014   Chest pain 03/20/2014   Carpal tunnel syndrome 12/28/2011   Primary hypertension 01/24/2011   SNORING 01/02/2011   Hyperlipidemia 11/01/2010   Anxiety state 11/01/2010   PLANTAR FASCIITIS, BILATERAL 08/15/2010   PAIN IN JOINT, ANKLE AND FOOT 03/10/2010   Episodic mood disorder 01/06/2010   Insomnia 01/24/2007    PCP: Antonio Cyndee Jamee JONELLE, DO   REFERRING PROVIDER: Jerri Kay HERO, MD   REFERRING DIAG: [phone] (ICD-10-CM) - Bilateral hip pain   THERAPY DIAG:  Pain in left hip  Pain in right hip  Difficulty in walking, not elsewhere classified  Muscle weakness (generalized)  Other muscle spasm  RATIONALE FOR EVALUATION AND TREATMENT: Rehabilitation  ONSET DATE: 2.5+ yrs  NEXT MD VISIT: None scheduled   SUBJECTIVE:  SUBJECTIVE STATEMENT: Pt reports both hips have been diagnosed with degeneration but his L hip is what he is really here for.  Unable to walk 1/2 mile w/o severe pain.  No relief from cortisone injection.  Gradual onset >2.5 years ago w/o known trauma or MOI.  Pain also interferes with ability to exercise or play golf.  He has had to limit LE weightlifting.   PAIN: Are you  having pain? Yes: NPRS scale: 2/10 currently, up to 10/10 with longer walks  Pain location: L lateral hip/trochanteric bursa Pain description: sharp, achy  Aggravating factors: prolonged walking/running, gym exercises, golf  Relieving factors: rubbing & stretching, massage gun   PERTINENT HISTORY:  OA - B hips and R knee, R knee meniscal repair 2023, L patellar subluxation, cervical spondylosis with myelopathy and radiculopathy s/p ACDF (C3-4, C4-5, C5-6) 02/28/23, LBP with R lumbar radiculopathy/sciatica, HTN, anxiety, depression, prostate cancer, GERD  PRECAUTIONS: None  RED FLAGS: None  WEIGHT BEARING RESTRICTIONS: No  FALLS:  Has patient fallen in last 6 months? 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   OBJECTIVE: (objective measures completed at initial evaluation unless otherwise dated)  DIAGNOSTIC FINDINGS:  10/09/24 - EXAM DESCRIPTION: MR HIP LEFT WO CONTRAST  CLINICAL HISTORY: 63 years, Male, Hip pain, chronic, articular cartilage eval, xray done   FINDINGS: No fracture. No erosions. The sacroiliac joints are unremarkable. Mild degenerative change to the left hip with small subchondral cysts and minimal degenerative edema to the anterior/superior acetabulum. Nondisplaced linear tear to the superior labrum best seen on series 7 image 15. No significant joint effusion or bursal fluid. The tendons and musculature are unremarkable. No subcutaneous edema.   There is a mildly complex bilobed fluid collection medial to the superior acetabulum on the right  and extending anteriorly. This is medial to the femoral vessels. The bilobed component measures 8 cm in greatest axial dimension. There is a larger mildly complex fluid collection medial to the superior acetabulum on the left and extending anteriorly. This is also medial to the femoral vessels and measures 9 cm in greatest axial dimension. Resection of the prostate.   IMPRESSION: There are bilateral mildly complex fluid collections medial to the superior acetabulum and extending anteriorly. This is larger on the left measuring up to 9 cm. The collections are medial to the femoral vessels. Findings probably reflect chronic postoperative seromas related to a Siemens prior prostate surgery. Aspiration if clinically warranted.   Nondisplaced linear tear to the superior labrum. Images given above.   Mild degenerative change to the left hip involving the anterior/superior acetabulum.  10/09/24 - EXAM DESCRIPTION: MR HIP RIGHT WO CONTRAST  CLINICAL HISTORY: Hip pain, chronic, articular cartilage eval, xray done   FINDINGS: No fracture. No erosions. The sacroiliac joints are unremarkable. No significant degenerative change to the right hip. No significant labral tear. No significant joint effusion or bursal fluid. The tendons and musculature are unremarkable. No subcutaneous edema.   There is a mildly complex bilobed fluid collection medial to the superior acetabulum on the right and extending  anteriorly. This is medial to the femoral vessels. The bilobed component measures 8 cm in greatest axial dimension. There is a larger mildly complex fluid collection medial to the superior acetabulum on the left and extending anteriorly. This is also medial to the femoral vessels and measures 9 cm in greatest axial dimension. Resection of the prostate.   IMPRESSION: There are bilateral mildly complex fluid collections medial to the superior acetabulum and extending anteriorly. This is larger on the  left measuring up to 9 cm. The collections are medial to the femoral vessels. Findings probably reflect subacute to chronic postoperative seromas related to prostate surgery. Aspiration if clinically warranted.   No significant degenerative change to the right hip.    PATIENT SURVEYS:  LEFS  Extreme difficulty/unable (0), Quite a bit of difficulty (1), Moderate difficulty (2), Little difficulty (3), No difficulty (4) Survey date:  11/26/2024   Any of your usual work, housework or school activities 2  2. Usual hobbies, recreational or sporting activities 0  3. Getting into/out of the bath 4  4. Walking between rooms 4  5. Putting on socks/shoes 3  6. Squatting  3  7. Lifting an object, like a bag of groceries from the floor 4  8. Performing light activities around your home 2  9. Performing heavy activities around your home 2  10. Getting into/out of a car 2  11. Walking 2 blocks 0  12. Walking 1 mile 0  13. Going up/down 10 stairs (1 flight) 2  14. Standing for 1 hour 3  15.  sitting for 1 hour 4  16. Running on even ground 0  17. Running on uneven ground 0  18. Making sharp turns while running fast 0  19. Hopping  0  20. Rolling over in bed 4  Score total:  39 / 80 = 48.8 %  Functional limitation: Moderate     COGNITION: Overall cognitive status: Within functional limits for tasks assessed    SENSATION: WFL Intermittent numbness in hands since ACDF, R lumbar radiculopathy  POSTURE:  B ankle supination with stance and SLS heel raises  PALPATION: Increased muscle tension in B glutes, piriformis, TFL, hip flexors and ITB (L>R), with TTP in L glutes and piriformis  MUSCLE LENGTH: Hamstrings: mod tight B ITB: mod/severe tight L>R Piriformis: mod tight L>R Hip flexors: mod/severe tight B Quads: mild tight B Heelcord: mild tight L>R  LOWER EXTREMITY ROM: Grossly WFL other than restrictions due to muscle tightness as above  LOWER EXTREMITY MMT:  MMT Right eval  Left eval  Hip flexion 5 5  Hip extension 4 4-  Hip abduction 4+ 4  Hip adduction 4+ 4  Hip internal rotation 4 p! Lateral hip 4 p! Lateral hip  Hip external rotation 4 4  Knee flexion 5 5  Knee extension 5 5  Ankle dorsiflexion 4+ 4+  Ankle plantarflexion 5 5  Ankle inversion    Ankle eversion     (Blank rows = not tested)  LOWER EXTREMITY SPECIAL TESTS:  Hip special tests: Belvie (FABER) test: positive  and Ober's test: positive     TODAY'S TREATMENT:    11/26/2024 - Eval SELF CARE:  Reviewed eval findings and role of PT in addressing identified deficits as well as instruction in initial HEP (see below).    PATIENT EDUCATION:  Education details: PT eval findings, anticipated POC, and initial HEP  Person educated: Patient Education method: Explanation, Demonstration, Verbal cues, Tactile cues, Handouts, and MedBridgeGO app access provided  Education comprehension: verbalized understanding, returned demonstration, verbal cues required, tactile cues required, and needs further education  HOME EXERCISE PROGRAM: *Patient planning to use MedBridgeGO app.  Access Code: [email or web address] URL: [email or web address] Date: 11/26/2024 Prepared by: Elijah Hidden  Exercises - Supine Hamstring Stretch with Strap  - 1-2 x daily - 7 x weekly - 3 reps - 30 sec hold - Supine Iliotibial Band Stretch with Strap  - 1-2 x daily - 7 x weekly - 3 reps - 30 sec hold - Supine Gluteus Stretch  - 1-2 x daily - 7 x weekly - 3 reps - 30 sec hold - Supine Figure 4 Piriformis Stretch  - 1-2 x daily - 7 x weekly - 3 reps - 30 sec hold - Supine Piriformis Stretch with Foot on Ground  - 1-2 x daily - 7 x weekly - 3 reps - 30 sec hold - Supine Figure 4 Piriformis Stretch  - 1-2 x daily - 7 x weekly - 3 reps - 30 sec hold   ASSESSMENT:  CLINICAL IMPRESSION: Jason Williams is a 63 y.o. male who was referred to physical therapy for evaluation and treatment for chronic B trochanteric hip pain,  although patient reports his main concern is that L hip as this seems to limit his activities more so than the R.  Patient reports gradual onset of L>R pain beginning ~2.5 years ago, exacerbated by period of forced inactivity following ACDF last year.  Pain is worse with prolonged walking, running, exercising, and golf.  Patient has deficits in L>R proximal LE flexibility, proximal B LE strength, abnormal posture, and TTP with abnormal muscle tension  which are interfering with ADLs and recreational activities and are impacting quality of life.  On LEFS patient scored 39/80 demonstrating moderate functional limitation.  Damond will benefit from skilled PT to address above deficits to improve mobility and activity tolerance with decreased pain interference.  OBJECTIVE IMPAIRMENTS: Abnormal gait, decreased activity tolerance, decreased endurance, decreased knowledge of condition, decreased mobility, difficulty walking, decreased strength, increased fascial restrictions, impaired perceived functional ability, increased muscle spasms, impaired flexibility, improper body mechanics, postural dysfunction, and pain.   ACTIVITY LIMITATIONS: standing, squatting, stairs, and locomotion level  PARTICIPATION LIMITATIONS: community activity, occupation, and recreation/golf  PERSONAL FACTORS: Past/current experiences, Time since onset of injury/illness/exacerbation, and 3+ comorbidities: OA - B hips and R knee, R knee meniscal repair 2023, L patellar subluxation, cervical spondylosis with myelopathy and radiculopathy s/p ACDF (C3-4, C4-5, C5-6) 02/28/23, LBP with R lumbar radiculopathy/sciatica, HTN, anxiety, depression, prostate cancer, GERD are also affecting patient's functional outcome.   REHAB POTENTIAL: Good  CLINICAL DECISION MAKING: Evolving/moderate complexity  EVALUATION COMPLEXITY: Moderate   GOALS: Goals reviewed with patient? Yes  SHORT TERM GOALS: Target date: 12/24/2024  Patient will be  independent with initial HEP. Baseline:  Goal status: INITIAL  2.  Patient will report at least 25% improvement in L hip pain to improve QOL. Baseline: 2/10 on eval, up to 10/10 with walking longer distances Goal status: INITIAL  LONG TERM GOALS: Target date: 01/21/2025  Patient will be independent with advanced/ongoing HEP to improve outcomes and carryover.  Baseline:  Goal status: INITIAL  2.  Patient will report at least 50-75% improvement in L hip pain to improve QOL. Baseline: 2/10 on eval, up to 10/10 with walking longer distances Goal status: INITIAL  3.  Patient will demonstrate improved B LE strength to >/= 4+/5 for improved stability and ease of mobility. Baseline: Refer to above LE MMT table Goal status:  INITIAL  4.  Patient will be able to ambulate 1+ miles without increased pain to access community.  Baseline: Unable to walk 1/2 mile without severe pain Goal status: INITIAL  5.  Patient will report >/= 48/80 on LEFS (MCID = 9 pts) to demonstrate improved functional ability. Baseline: 39 / 80 = 48.8 % Goal status: INITIAL  6.  Patient will report ability to resume playing golf without limitation due to L hip pain. Baseline: L hip pain prevents him from playing golf Goal status: INITIAL    PLAN:  PT FREQUENCY: 1-2x/week  PT DURATION: 8 weeks  PLANNED INTERVENTIONS: [id number]- PT Re-evaluation, 97750- Physical Performance Testing, 97110-Therapeutic exercises, 97530- Therapeutic activity, 97112- Neuromuscular re-education, 97535- Self Care, 02859- Manual therapy, [phone]- Gait training, [phone]- Aquatic Therapy, [phone]- Electrical stimulation (unattended), [id number]- Ultrasound, [id number]- Ionotophoresis 4mg /ml Dexamethasone , 79439 (1-2 muscles), 20561 (3+ muscles)- Dry Needling, Patient/Family education, Balance training, Stair training, Taping, Joint mobilization, Cryotherapy, and Moist heat  PLAN FOR NEXT SESSION: Review initial HEP; initiate core/lumbopelvic and proximal LE  strengthening, updating HEP accordingly; MT +/- TPDN to address abnormal muscle tension in glutes, TFL and lateral hip/thigh musculature   Elijah CHRISTELLA Hidden, PT 11/26/2024, 1:52 PM  "

## 2024-11-18 ENCOUNTER — Other Ambulatory Visit: Payer: Self-pay | Admitting: Family Medicine

## 2024-11-18 ENCOUNTER — Telehealth: Payer: Self-pay

## 2024-11-18 DIAGNOSIS — Z0000 Encounter for general adult medical examination without abnormal findings: Secondary | ICD-10-CM

## 2024-11-18 DIAGNOSIS — M25551 Pain in right hip: Secondary | ICD-10-CM

## 2024-11-18 DIAGNOSIS — F419 Anxiety disorder, unspecified: Secondary | ICD-10-CM

## 2024-11-18 MED ORDER — DIAZEPAM 10 MG PO TABS: 10.0000 mg | ORAL_TABLET | Freq: Three times a day (TID) | ORAL | 1 refills | Status: AC

## 2024-11-18 MED ORDER — TRAMADOL HCL 50 MG PO TABS: 50.0000 mg | ORAL_TABLET | Freq: Four times a day (QID) | ORAL | 0 refills | Status: DC | PRN

## 2024-11-18 NOTE — Telephone Encounter (Unsigned)
 Copied from CRM #[id number]. Topic: Clinical - Medication Refill >> Nov 18, 2024 10:57 AM Alfonso ORN wrote: Medication: diazepam  (VALIUM ) 10 MG tablet [[id number]]   traMADol  (ULTRAM ) 50 MG tablet [[id number]  Has the patient contacted their pharmacy? Yes  This is the patient's preferred pharmacy:  [id number] - [place], [place] - [address] [place] [address] Phone: [phone] Fax: [phone]  Is this the correct pharmacy for this prescription? Yes If no, delete pharmacy and type the correct one.   Has the prescription been filled recently? No  Is the patient out of the medication? Yes  Has the patient been seen for an appointment in the last year OR does the patient have an upcoming appointment? Yes  Can we respond through MyChart? Yes

## 2024-11-18 NOTE — Telephone Encounter (Signed)
 Copied from CRM [id number]. Topic: Clinical - Prescription Issue >> Nov 18, 2024 11:00 AM Jason Williams wrote: Reason for CRM: pt was told by dr who completed surgery that he could not take wegovy  for 3 months he is asking to be started back with medication. Please advise [phone]

## 2024-11-18 NOTE — Telephone Encounter (Signed)
 Requesting: diazepam  and tramadol   Contract: 03/19/23 UDS: 03/19/23 Last Visit: 07/18/24 Next Visit: None Last Refill: see med list   Please Advise

## 2024-11-24 DIAGNOSIS — Z87440 Personal history of urinary (tract) infections: Secondary | ICD-10-CM | POA: Diagnosis not present

## 2024-11-24 DIAGNOSIS — M62838 Other muscle spasm: Secondary | ICD-10-CM | POA: Diagnosis not present

## 2024-11-24 DIAGNOSIS — M6281 Muscle weakness (generalized): Secondary | ICD-10-CM | POA: Diagnosis not present

## 2024-11-24 DIAGNOSIS — N393 Stress incontinence (female) (male): Secondary | ICD-10-CM | POA: Diagnosis not present

## 2024-11-25 MED ORDER — WEGOVY 0.5 MG/0.5ML ~~LOC~~ SOAJ: 0.5000 mg | SUBCUTANEOUS | 0 refills | Status: AC

## 2024-11-25 NOTE — Telephone Encounter (Signed)
 May need new PA please

## 2024-11-25 NOTE — Addendum Note (Signed)
 Addended by: ELOUISE POWELL HERO on: 11/25/2024 04:40 PM   Modules accepted: Orders

## 2024-11-26 ENCOUNTER — Other Ambulatory Visit (HOSPITAL_COMMUNITY): Payer: Self-pay

## 2024-11-26 ENCOUNTER — Other Ambulatory Visit: Payer: Self-pay

## 2024-11-26 ENCOUNTER — Encounter: Payer: Self-pay | Admitting: Physical Therapy

## 2024-11-26 ENCOUNTER — Telehealth: Payer: Self-pay

## 2024-11-26 ENCOUNTER — Ambulatory Visit: Admitting: Physical Therapy

## 2024-11-26 DIAGNOSIS — R262 Difficulty in walking, not elsewhere classified: Secondary | ICD-10-CM | POA: Diagnosis not present

## 2024-11-26 DIAGNOSIS — M62838 Other muscle spasm: Secondary | ICD-10-CM | POA: Diagnosis not present

## 2024-11-26 DIAGNOSIS — M25552 Pain in left hip: Secondary | ICD-10-CM | POA: Insufficient documentation

## 2024-11-26 DIAGNOSIS — M25551 Pain in right hip: Secondary | ICD-10-CM | POA: Insufficient documentation

## 2024-11-26 DIAGNOSIS — M6281 Muscle weakness (generalized): Secondary | ICD-10-CM | POA: Diagnosis not present

## 2024-11-26 NOTE — Telephone Encounter (Signed)
 Pharmacy Patient Advocate Encounter  Received notification from [facility] that Prior Authorization for Wegovy  0.5mg /0.26ml has been CANCELLED due to the PA has been resolved and no additional PA is required.

## 2024-11-26 NOTE — Telephone Encounter (Signed)
 Pharmacy Patient Advocate Encounter   Received notification from Pt Calls Messages that prior authorization for Wegovy  0.5mg /0.55ml is required/requested.   Insurance verification completed.   The patient is insured through [facility].   Per test claim: PA required; PA submitted to above mentioned insurance via Latent Key/confirmation #/EOC [id number] Status is pending

## 2024-11-28 ENCOUNTER — Other Ambulatory Visit: Payer: Self-pay | Admitting: Nurse Practitioner

## 2024-11-28 DIAGNOSIS — I2510 Atherosclerotic heart disease of native coronary artery without angina pectoris: Secondary | ICD-10-CM

## 2024-11-28 DIAGNOSIS — E785 Hyperlipidemia, unspecified: Secondary | ICD-10-CM

## 2024-12-02 ENCOUNTER — Ambulatory Visit: Admitting: Family Medicine

## 2024-12-03 ENCOUNTER — Ambulatory Visit

## 2024-12-10 ENCOUNTER — Ambulatory Visit: Attending: Orthopaedic Surgery | Admitting: Physical Therapy

## 2024-12-10 ENCOUNTER — Encounter: Payer: Self-pay | Admitting: Physical Therapy

## 2024-12-10 DIAGNOSIS — R262 Difficulty in walking, not elsewhere classified: Secondary | ICD-10-CM | POA: Diagnosis present

## 2024-12-10 DIAGNOSIS — M6281 Muscle weakness (generalized): Secondary | ICD-10-CM | POA: Insufficient documentation

## 2024-12-10 DIAGNOSIS — M25552 Pain in left hip: Secondary | ICD-10-CM | POA: Insufficient documentation

## 2024-12-10 DIAGNOSIS — M25551 Pain in right hip: Secondary | ICD-10-CM | POA: Insufficient documentation

## 2024-12-10 DIAGNOSIS — M62838 Other muscle spasm: Secondary | ICD-10-CM | POA: Diagnosis present

## 2024-12-10 NOTE — Therapy (Signed)
 " OUTPATIENT PHYSICAL THERAPY TREATMENT   Patient Name: Jason Williams MRN: [id number] DOB:1961-08-18, 64 y.o., male Today's Date: 12/10/2024   END OF SESSION:  PT End of Session - 12/10/24 0931     Visit Number 2    Date for Recertification  01/21/25    Authorization Type BCBS    PT Start Time [phone]    PT Stop Time 1015    PT Time Calculation (min) 44 min    Activity Tolerance Patient tolerated treatment well    Behavior During Therapy [facility] for tasks assessed/performed           Past Medical History:  Diagnosis Date   Anxiety    Arthritis    Cancer (HCC)    prostate cancer   Depression    GERD (gastroesophageal reflux disease)    Headache    Heart murmur    as child   History of MRSA infection    toe on right foot   Hyperlipidemia    Hypertension    Sleep apnea    no CPAP  lost weight   Past Surgical History:  Procedure Laterality Date   ankle re construction Bilateral 11/27/2009   cadaver ligament used, no hardware per patient, by Dr. Beverley   ANTERIOR CERVICAL DECOMP/DISCECTOMY FUSION N/A 02/28/2023   Procedure: Anterior Cervical Decompression/Discectomy Fusion, Interbody Prosthesis, Plate/Screws Cervical Three-Cervical Four, Cervical Four-Cervical Five, Cervical Five- Cervical Six;  Surgeon: Mavis Purchase, MD;  Location: [facility] OR;  Service: Neurosurgery;  Laterality: N/A;  3C   APPENDECTOMY  11/28/1979   exploratory surgery d/t appendix bursting   EYE SURGERY Bilateral    lasik surgery   ROBOT ASSISTED LAPAROSCOPIC RADICAL PROSTATECTOMY N/A 09/18/2024   Procedure: ROBOTIC ASSISTED LAPAROSCOPIC RADICAL PROSTATECTOMY;  Surgeon: Shane Steffan BROCKS, MD;  Location: [facility] ORS;  Service: Urology;  Laterality: N/A;   WISDOM TOOTH EXTRACTION     Patient Active Problem List   Diagnosis Date Noted   Prostate cancer (HCC) 09/18/2024   Ear pain, bilateral 07/18/2024   Hyperglycemia 04/11/2024   Dizziness 04/11/2024   Bilateral hip pain 03/19/2023   Cervical spondylosis  with myelopathy and radiculopathy 02/28/2023   Suspicious nevus 11/09/2022   Chronic pain syndrome 11/09/2022   Excessive urination at night 04/28/2022   Statin intolerance 07/12/2021   Primary osteoarthritis of right knee 07/12/2021   Acute pain of right knee 12/22/2020   Preventative health care 09/15/2019   Screening for HIV without presence of risk factors 09/15/2019   Encounter for hepatitis C screening test for low risk patient 09/15/2019   BPH (benign prostatic hyperplasia) 03/31/2016   GERD (gastroesophageal reflux disease) 03/18/2015   ED 10/07/2014   Chest pain 03/20/2014   Carpal tunnel syndrome 12/28/2011   Primary hypertension 01/24/2011   SNORING 01/02/2011   Hyperlipidemia 11/01/2010   Anxiety state 11/01/2010   PLANTAR FASCIITIS, BILATERAL 08/15/2010   PAIN IN JOINT, ANKLE AND FOOT 03/10/2010   Episodic mood disorder 01/06/2010   Insomnia 01/24/2007    PCP: Antonio Cyndee Jamee JONELLE, DO   REFERRING PROVIDER: Jerri Kay HERO, MD   REFERRING DIAG: [phone] (ICD-10-CM) - Bilateral hip pain   THERAPY DIAG:  Pain in left hip  Pain in right hip  Difficulty in walking, not elsewhere classified  Muscle weakness (generalized)  Other muscle spasm  RATIONALE FOR EVALUATION AND TREATMENT: Rehabilitation  ONSET DATE: 2.5+ yrs  NEXT MD VISIT: None scheduled   SUBJECTIVE:  SUBJECTIVE STATEMENT: Pt reports his pain has gotten a little better but still is primarily when he walks a longer distances.  EVAL: Pt reports both hips have been diagnosed with degeneration but his L hip is what he is really here for.  Unable to walk 1/2 mile w/o severe pain.  No relief from cortisone injection.  Gradual onset >2.5 years ago w/o known trauma or MOI.  Pain also interferes with  ability to exercise or play golf.  He has had to limit LE weightlifting.   PAIN: Are you having pain? Yes: NPRS scale: 0/10 currently, up to 7-8/10 with longer walks  Pain location: L lateral hip/trochanteric bursa Pain description: sharp, achy  Aggravating factors: prolonged walking/running, gym exercises, golf  Relieving factors: rubbing & stretching, massage gun   PERTINENT HISTORY:  OA - B hips and R knee, R knee meniscal repair 2023, L patellar subluxation, cervical spondylosis with myelopathy and radiculopathy s/p ACDF (C3-4, C4-5, C5-6) 02/28/23, LBP with R lumbar radiculopathy/sciatica, HTN, anxiety, depression, prostate cancer, GERD  PRECAUTIONS: None  RED FLAGS: None  WEIGHT BEARING RESTRICTIONS: No  FALLS:  Has patient fallen in last 6 months? 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   OBJECTIVE: (objective measures completed at initial evaluation unless otherwise dated)  DIAGNOSTIC FINDINGS:  10/09/24 - EXAM DESCRIPTION: MR HIP LEFT WO CONTRAST  CLINICAL HISTORY: 63 years, Male, Hip pain, chronic, articular cartilage eval, xray done   FINDINGS: No fracture. No erosions. The sacroiliac joints are unremarkable. Mild degenerative change to the left hip with small subchondral cysts and minimal degenerative edema to the anterior/superior acetabulum. Nondisplaced linear tear to the superior labrum best seen on series 7 image 15. No significant joint effusion or bursal fluid. The tendons and musculature are unremarkable. No subcutaneous edema.   There  is a mildly complex bilobed fluid collection medial to the superior acetabulum on the right and extending anteriorly. This is medial to the femoral vessels. The bilobed component measures 8 cm in greatest axial dimension. There is a larger mildly complex fluid collection medial to the superior acetabulum on the left and extending anteriorly. This is also medial to the femoral vessels and measures 9 cm in greatest axial dimension. Resection of the prostate.   IMPRESSION: There are bilateral mildly complex fluid collections medial to the superior acetabulum and extending anteriorly. This is larger on the left measuring up to 9 cm. The collections are medial to the femoral vessels. Findings probably reflect chronic postoperative seromas related to a Siemens prior prostate surgery. Aspiration if clinically warranted.   Nondisplaced linear tear to the superior labrum. Images given above.   Mild degenerative change to the left hip involving the anterior/superior acetabulum.  10/09/24 - EXAM DESCRIPTION: MR HIP RIGHT WO CONTRAST  CLINICAL HISTORY: Hip pain, chronic, articular cartilage eval, xray done   FINDINGS: No fracture. No erosions. The sacroiliac joints are unremarkable. No significant degenerative change to the right hip. No significant labral tear. No significant joint effusion or bursal fluid. The tendons and musculature are unremarkable. No subcutaneous  edema.   There is a mildly complex bilobed fluid collection medial to the superior acetabulum on the right and extending anteriorly. This is medial to the femoral vessels. The bilobed component measures 8 cm in greatest axial dimension. There is a larger mildly complex fluid collection medial to the superior acetabulum on the left and extending anteriorly. This is also medial to the femoral vessels and measures 9 cm in greatest axial dimension. Resection of the prostate.   IMPRESSION: There are bilateral mildly complex fluid  collections medial to the superior acetabulum and extending anteriorly. This is larger on the left measuring up to 9 cm. The collections are medial to the femoral vessels. Findings probably reflect subacute to chronic postoperative seromas related to prostate surgery. Aspiration if clinically warranted.   No significant degenerative change to the right hip.    PATIENT SURVEYS:  LEFS  Extreme difficulty/unable (0), Quite a bit of difficulty (1), Moderate difficulty (2), Little difficulty (3), No difficulty (4) Survey date:  11/26/2024   Any of your usual work, housework or school activities 2  2. Usual hobbies, recreational or sporting activities 0  3. Getting into/out of the bath 4  4. Walking between rooms 4  5. Putting on socks/shoes 3  6. Squatting  3  7. Lifting an object, like a bag of groceries from the floor 4  8. Performing light activities around your home 2  9. Performing heavy activities around your home 2  10. Getting into/out of a car 2  11. Walking 2 blocks 0  12. Walking 1 mile 0  13. Going up/down 10 stairs (1 flight) 2  14. Standing for 1 hour 3  15.  sitting for 1 hour 4  16. Running on even ground 0  17. Running on uneven ground 0  18. Making sharp turns while running fast 0  19. Hopping  0  20. Rolling over in bed 4  Score total:  39 / 80 = 48.8 %  Functional limitation: Moderate     COGNITION: Overall cognitive status: Within functional limits for tasks assessed    SENSATION: WFL Intermittent numbness in hands since ACDF, R lumbar radiculopathy  POSTURE:  B ankle supination with stance and SLS heel raises  PALPATION: Increased muscle tension in B glutes, piriformis, TFL, hip flexors and ITB (L>R), with TTP in L glutes and piriformis  MUSCLE LENGTH: Hamstrings: mod tight B ITB: mod/severe tight L>R Piriformis: mod tight L>R Hip flexors: mod/severe tight B Quads: mild tight B Heelcord: mild tight L>R  LOWER EXTREMITY ROM: Grossly WFL other  than restrictions due to muscle tightness as above  LOWER EXTREMITY MMT:  MMT Right eval Left eval  Hip flexion 5 5  Hip extension 4 4-  Hip abduction 4+ 4  Hip adduction 4+ 4  Hip internal rotation 4 p! Lateral hip 4 p! Lateral hip  Hip external rotation 4 4  Knee flexion 5 5  Knee extension 5 5  Ankle dorsiflexion 4+ 4+  Ankle plantarflexion 5 5  Ankle inversion    Ankle eversion     (Blank rows = not tested)  LOWER EXTREMITY SPECIAL TESTS:  Hip special tests: Belvie (FABER) test: positive  and Ober's test: positive     TODAY'S TREATMENT:    12/10/2024  THERAPEUTIC EXERCISE: To improve strength, endurance, ROM, and flexibility.  Demonstration, verbal and tactile cues throughout for technique.  NuStep - L8 x 6' (UE/LE) Supine L HS stretch with strap x 30-60  Supine L ITB  stretch with strap x 30-60 Supine L glute stretch x 30-60  Hooklying L figure-4 piriformis stretch with overpressure x 30  Hooklying L KTOS piriformis stretch x 30  Hooklying L figure-4 to chest piriformis stretch x 30  Standing L lateral lean ITB stretch x 30  Bridge + GTB hip ABD isometric 10 x 5, 2 sets S/L L GTB clam 10 x 3, 2 sets S/L L hip ABD 2 x 10 Prone L hip extension 2 x 10 1/2 kneel hip flexor stretch x 30-60 B   11/26/2024 - Eval SELF CARE:  Reviewed eval findings and role of PT in addressing identified deficits as well as instruction in initial HEP (see below).    PATIENT EDUCATION:  Education details: HEP review and HEP update - hip stretch progression & core/hip strengthening  Person educated: Patient Education method: Explanation, Demonstration, Verbal cues, Tactile cues, Handouts, and MedBridgeGO app updated Education comprehension: verbalized understanding, returned demonstration, verbal cues required, tactile cues required, and needs further education  HOME EXERCISE PROGRAM: *Patient using MedBridge[employer] app.  Access Code: [email or web address] URL:  [email or web address] Date: 12/10/2024 Prepared by: Elijah Hidden  Exercises - Supine Hamstring Stretch with Strap  - 1-2 x daily - 7 x weekly - 3 reps - 30 sec hold - Supine Iliotibial Band Stretch with Strap  - 1-2 x daily - 7 x weekly - 3 reps - 30 sec hold - Supine Gluteus Stretch  - 1-2 x daily - 7 x weekly - 3 reps - 30 sec hold - Supine Figure 4 Piriformis Stretch  - 1-2 x daily - 7 x weekly - 3 reps - 30 sec hold - Supine Piriformis Stretch with Foot on Ground  - 1-2 x daily - 7 x weekly - 3 reps - 30 sec hold - Supine Figure 4 Piriformis Stretch  - 1-2 x daily - 7 x weekly - 3 reps - 30 sec hold - Half Kneeling Hip Flexor Stretch  - 2-3 x daily - 7 x weekly - 3 reps - 30 sec hold - Standing ITB Stretch  - 1-2 x daily - 7 x weekly - 3 reps - 30 sec hold - Supine Bridge with Resistance Band  - 1 x daily - 7 x weekly - 2 sets - 10 reps - 5 sec hold - Clam with Resistance (Mirrored)  - 1 x daily - 7 x weekly - 2 sets - 10 reps - 3-5 sec hold - Sidelying Hip Abduction (Mirrored)  - 1 x daily - 7 x weekly - 2 sets - 10 reps - 3-5 sec hold - Prone Hip Extension (Mirrored)  - 1 x daily - 7 x weekly - 2 sets - 10 reps - 3 sec hold   ASSESSMENT:  CLINICAL IMPRESSION: Johnryan reports some slight improvement in his L hip pain with the HEP stretches but still with limited walking tolerance.  HEP reviewed, providing clarification as needed as well as brief discussion of seated and/or standing alternatives for stretches - pt preferring supine versions.  Initiated core/lumbopelvic and hip strengthening targeting areas of weakness identified on eval, updating HEP accordingly and providing green and blue TB for self-progression at home as pt preferring to attend PT only qow.  Octavius will benefit from continued skilled PT to address ongoing flexibility and strength deficits to improve mobility and activity tolerance with decreased pain interference.    EVAL:  Larell Baney is a 64 y.o.  male who was referred to physical therapy for evaluation and treatment for  chronic B trochanteric hip pain, although patient reports his main concern is that L hip as this seems to limit his activities more so than the R.  Patient reports gradual onset of L>R pain beginning ~2.5 years ago, exacerbated by period of forced inactivity following ACDF last year.  Pain is worse with prolonged walking, running, exercising, and golf.  Patient has deficits in L>R proximal LE flexibility, proximal B LE strength, abnormal posture, and TTP with abnormal muscle tension  which are interfering with ADLs and recreational activities and are impacting quality of life.  On LEFS patient scored 39/80 demonstrating moderate functional limitation.  Patterson will benefit from skilled PT to address above deficits to improve mobility and activity tolerance with decreased pain interference.  OBJECTIVE IMPAIRMENTS: Abnormal gait, decreased activity tolerance, decreased endurance, decreased knowledge of condition, decreased mobility, difficulty walking, decreased strength, increased fascial restrictions, impaired perceived functional ability, increased muscle spasms, impaired flexibility, improper body mechanics, postural dysfunction, and pain.   ACTIVITY LIMITATIONS: standing, squatting, stairs, and locomotion level  PARTICIPATION LIMITATIONS: community activity, occupation, and recreation/golf  PERSONAL FACTORS: Past/current experiences, Time since onset of injury/illness/exacerbation, and 3+ comorbidities: OA - B hips and R knee, R knee meniscal repair 2023, L patellar subluxation, cervical spondylosis with myelopathy and radiculopathy s/p ACDF (C3-4, C4-5, C5-6) 02/28/23, LBP with R lumbar radiculopathy/sciatica, HTN, anxiety, depression, prostate cancer, GERD are also affecting patient's functional outcome.   REHAB POTENTIAL: Good  CLINICAL DECISION MAKING: Evolving/moderate complexity  EVALUATION COMPLEXITY:  Moderate   GOALS: Goals reviewed with patient? Yes  SHORT TERM GOALS: Target date: 12/24/2024  Patient will be independent with initial HEP. Baseline:  Goal status: MET - 12/10/24  2.  Patient will report at least 25% improvement in L hip pain to improve QOL. Baseline: 2/10 on eval, up to 10/10 with walking longer distances Goal status: INITIAL  LONG TERM GOALS: Target date: 01/21/2025  Patient will be independent with advanced/ongoing HEP to improve outcomes and carryover.  Baseline:  Goal status: IN PROGRESS - 12/10/24 - HEP updated with initial strengthening program  2.  Patient will report at least 50-75% improvement in L hip pain to improve QOL. Baseline: 2/10 on eval, up to 10/10 with walking longer distances Goal status: INITIAL  3.  Patient will demonstrate improved B LE strength to >/= 4+/5 for improved stability and ease of mobility. Baseline: Refer to above LE MMT table Goal status: INITIAL  4.  Patient will be able to ambulate 1+ miles without increased pain to access community.  Baseline: Unable to walk 1/2 mile without severe pain Goal status: INITIAL  5.  Patient will report >/= 48/80 on LEFS (MCID = 9 pts) to demonstrate improved functional ability. Baseline: 39 / 80 = 48.8 % Goal status: INITIAL  6.  Patient will report ability to resume playing golf without limitation due to L hip pain. Baseline: L hip pain prevents him from playing golf Goal status: INITIAL    PLAN:  PT FREQUENCY: 1-2x/week  PT DURATION: 8 weeks  PLANNED INTERVENTIONS: [id number]- PT Re-evaluation, 97750- Physical Performance Testing, 97110-Therapeutic exercises, 97530- Therapeutic activity, [id number]- Neuromuscular re-education, 97535- Self Care, [id number]- Manual therapy, [phone]- Gait training, [phone]- Aquatic Therapy, [phone]- Electrical stimulation (unattended), [id number]- Ultrasound, [id number]- Ionotophoresis 4mg /ml Dexamethasone , 79439 (1-2 muscles), [id number] (3+ muscles)- Dry Needling, Patient/Family  education, Balance training, Stair training, Taping, Joint mobilization, Cryotherapy, and Moist heat  PLAN FOR NEXT SESSION: progress core/lumbopelvic and proximal LE strengthening, updating HEP accordingly as pt preferring to come only 1x  qow; MT +/- TPDN to address abnormal muscle tension in glutes, TFL and lateral hip/thigh musculature   Elijah CHRISTELLA Hidden, PT 12/10/2024, 1:58 PM  "

## 2024-12-17 ENCOUNTER — Ambulatory Visit: Admitting: Physical Therapy

## 2024-12-18 ENCOUNTER — Other Ambulatory Visit: Payer: Self-pay | Admitting: Family

## 2024-12-18 DIAGNOSIS — Z0000 Encounter for general adult medical examination without abnormal findings: Secondary | ICD-10-CM

## 2024-12-18 DIAGNOSIS — M25552 Pain in left hip: Secondary | ICD-10-CM

## 2024-12-18 NOTE — Telephone Encounter (Signed)
 Requesting: tramadol  50mg   Contract: 03/19/23 UDS: 03/19/23 Last Visit: 07/18/24 Next Visit: None Last Refill: 11/18/24 #90 and 0RF   Please Advise

## 2024-12-18 NOTE — Telephone Encounter (Signed)
 Patient called to advise that he needs an refill on the traMADol  (ULTRAM ) 50 MG tablet. The request was put in today and advised the 3 day refill policy.

## 2024-12-24 ENCOUNTER — Ambulatory Visit: Admitting: Physical Therapy

## 2024-12-24 ENCOUNTER — Encounter: Payer: Self-pay | Admitting: Physical Therapy

## 2024-12-24 DIAGNOSIS — M25552 Pain in left hip: Secondary | ICD-10-CM

## 2024-12-24 DIAGNOSIS — M25551 Pain in right hip: Secondary | ICD-10-CM

## 2024-12-24 DIAGNOSIS — R262 Difficulty in walking, not elsewhere classified: Secondary | ICD-10-CM

## 2024-12-24 DIAGNOSIS — M6281 Muscle weakness (generalized): Secondary | ICD-10-CM

## 2024-12-24 DIAGNOSIS — M62838 Other muscle spasm: Secondary | ICD-10-CM

## 2024-12-24 NOTE — Therapy (Signed)
 " OUTPATIENT PHYSICAL THERAPY TREATMENT   Patient Name: Jason Williams MRN: [id number] DOB:[birth year], 64 y.o., male Today's Date: 12/24/2024   END OF SESSION:  PT End of Session - 12/24/24 0932     Visit Number 3    Date for Recertification  01/21/25    Authorization Type BCBS    PT Start Time 0932    PT Stop Time 1014    PT Time Calculation (min) 42 min    Activity Tolerance Patient tolerated treatment well    Behavior During Therapy WFL for tasks assessed/performed            Past Medical History:  Diagnosis Date   Anxiety    Arthritis    Cancer (HCC)    prostate cancer   Depression    GERD (gastroesophageal reflux disease)    Headache    Heart murmur    as child   History of MRSA infection    toe on right foot   Hyperlipidemia    Hypertension    Sleep apnea    no CPAP  lost weight   Past Surgical History:  Procedure Laterality Date   ankle re construction Bilateral 11/27/2009   cadaver ligament used, no hardware per patient, by Dr. Beverley   ANTERIOR CERVICAL DECOMP/DISCECTOMY FUSION N/A 02/28/2023   Procedure: Anterior Cervical Decompression/Discectomy Fusion, Interbody Prosthesis, Plate/Screws Cervical Three-Cervical Four, Cervical Four-Cervical Five, Cervical Five- Cervical Six;  Surgeon: Mavis Purchase, MD;  Location: [facility] OR;  Service: Neurosurgery;  Laterality: N/A;  3C   APPENDECTOMY  11/28/1979   exploratory surgery d/t appendix bursting   EYE SURGERY Bilateral    lasik surgery   ROBOT ASSISTED LAPAROSCOPIC RADICAL PROSTATECTOMY N/A 09/18/2024   Procedure: ROBOTIC ASSISTED LAPAROSCOPIC RADICAL PROSTATECTOMY;  Surgeon: Shane Steffan BROCKS, MD;  Location: [facility] ORS;  Service: Urology;  Laterality: N/A;   WISDOM TOOTH EXTRACTION     Patient Active Problem List   Diagnosis Date Noted   Prostate cancer (HCC) 09/18/2024   Ear pain, bilateral 07/18/2024   Hyperglycemia 04/11/2024   Dizziness 04/11/2024   Bilateral hip pain 03/19/2023   Cervical  spondylosis with myelopathy and radiculopathy 02/28/2023   Suspicious nevus 11/09/2022   Chronic pain syndrome 11/09/2022   Excessive urination at night 04/28/2022   Statin intolerance 07/12/2021   Primary osteoarthritis of right knee 07/12/2021   Acute pain of right knee 12/22/2020   Preventative health care 09/15/2019   Screening for HIV without presence of risk factors 09/15/2019   Encounter for hepatitis C screening test for low risk patient 09/15/2019   BPH (benign prostatic hyperplasia) 03/31/2016   GERD (gastroesophageal reflux disease) 03/18/2015   ED 10/07/2014   Chest pain 03/20/2014   Carpal tunnel syndrome 12/28/2011   Primary hypertension 01/24/2011   SNORING 01/02/2011   Hyperlipidemia 11/01/2010   Anxiety state 11/01/2010   PLANTAR FASCIITIS, BILATERAL 08/15/2010   PAIN IN JOINT, ANKLE AND FOOT 03/10/2010   Episodic mood disorder 01/06/2010   Insomnia 01/24/2007    PCP: Antonio Cyndee Jamee JONELLE, DO   REFERRING PROVIDER: Jerri Kay HERO, MD   REFERRING DIAG: [phone] (ICD-10-CM) - Bilateral hip pain   THERAPY DIAG:  Pain in left hip  Pain in right hip  Difficulty in walking, not elsewhere classified  Muscle weakness (generalized)  Other muscle spasm  RATIONALE FOR EVALUATION AND TREATMENT: Rehabilitation  ONSET DATE: 2.5+ yrs  NEXT MD VISIT: None scheduled   SUBJECTIVE:  SUBJECTIVE STATEMENT: Pt reports he has been going for a few walks and feels that things are getting better - able to walk 3x as far as before.  He has noted a little more soreness in his R hip with the increased activity.  EVAL: Pt reports both hips have been diagnosed with degeneration but his L hip is what he is really here for.  Unable to walk 1/2 mile w/o severe pain.  No relief  from cortisone injection.  Gradual onset >2.5 years ago w/o known trauma or MOI.  Pain also interferes with ability to exercise or play golf.  He has had to limit LE weightlifting.   PAIN: Are you having pain? Yes: NPRS scale: 0/10 currently, up to 5-6/10 with longer walks  Pain location: L lateral hip/trochanteric bursa Pain description: sharp, achy  Aggravating factors: prolonged walking/running, gym exercises, golf  Relieving factors: rubbing & stretching, massage gun   PERTINENT HISTORY:  OA - B hips and R knee, R knee meniscal repair 2023, L patellar subluxation, cervical spondylosis with myelopathy and radiculopathy s/p ACDF (C3-4, C4-5, C5-6) 02/28/23, LBP with R lumbar radiculopathy/sciatica, HTN, anxiety, depression, prostate cancer, GERD  PRECAUTIONS: None  RED FLAGS: None  WEIGHT BEARING RESTRICTIONS: No  FALLS:  Has patient fallen in last 6 months? 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   OBJECTIVE: (objective measures completed at initial evaluation unless otherwise dated)  DIAGNOSTIC FINDINGS:  10/09/24 - EXAM DESCRIPTION: MR HIP LEFT WO CONTRAST  CLINICAL HISTORY: 63 years, Male, Hip pain, chronic, articular cartilage eval, xray done   FINDINGS: No fracture. No erosions. The sacroiliac joints are unremarkable. Mild degenerative change to the left hip with small subchondral cysts and minimal degenerative edema to the anterior/superior acetabulum. Nondisplaced linear tear to the superior labrum best seen on series 7 image 15. No significant  joint effusion or bursal fluid. The tendons and musculature are unremarkable. No subcutaneous edema.   There is a mildly complex bilobed fluid collection medial to the superior acetabulum on the right and extending anteriorly. This is medial to the femoral vessels. The bilobed component measures 8 cm in greatest axial dimension. There is a larger mildly complex fluid collection medial to the superior acetabulum on the left and extending anteriorly. This is also medial to the femoral vessels and measures 9 cm in greatest axial dimension. Resection of the prostate.   IMPRESSION: There are bilateral mildly complex fluid collections medial to the superior acetabulum and extending anteriorly. This is larger on the left measuring up to 9 cm. The collections are medial to the femoral vessels. Findings probably reflect chronic postoperative seromas related to a Siemens prior prostate surgery. Aspiration if clinically warranted.   Nondisplaced linear tear to the superior labrum. Images given above.   Mild degenerative change to the left hip involving the anterior/superior acetabulum.  10/09/24 - EXAM DESCRIPTION: MR HIP RIGHT WO CONTRAST  CLINICAL HISTORY: Hip pain, chronic, articular cartilage eval, xray done   FINDINGS: No fracture. No erosions. The sacroiliac joints are unremarkable. No significant degenerative change  to the right hip. No significant labral tear. No significant joint effusion or bursal fluid. The tendons and musculature are unremarkable. No subcutaneous edema.   There is a mildly complex bilobed fluid collection medial to the superior acetabulum on the right and extending anteriorly. This is medial to the femoral vessels. The bilobed component measures 8 cm in greatest axial dimension. There is a larger mildly complex fluid collection medial to the superior acetabulum on the left and extending anteriorly. This is also medial to the femoral vessels and measures 9 cm in  greatest axial dimension. Resection of the prostate.   IMPRESSION: There are bilateral mildly complex fluid collections medial to the superior acetabulum and extending anteriorly. This is larger on the left measuring up to 9 cm. The collections are medial to the femoral vessels. Findings probably reflect subacute to chronic postoperative seromas related to prostate surgery. Aspiration if clinically warranted.   No significant degenerative change to the right hip.    PATIENT SURVEYS:  LEFS  Extreme difficulty/unable (0), Quite a bit of difficulty (1), Moderate difficulty (2), Little difficulty (3), No difficulty (4) Survey date:  11/26/2024   Any of your usual work, housework or school activities 2  2. Usual hobbies, recreational or sporting activities 0  3. Getting into/out of the bath 4  4. Walking between rooms 4  5. Putting on socks/shoes 3  6. Squatting  3  7. Lifting an object, like a bag of groceries from the floor 4  8. Performing light activities around your home 2  9. Performing heavy activities around your home 2  10. Getting into/out of a car 2  11. Walking 2 blocks 0  12. Walking 1 mile 0  13. Going up/down 10 stairs (1 flight) 2  14. Standing for 1 hour 3  15.  sitting for 1 hour 4  16. Running on even ground 0  17. Running on uneven ground 0  18. Making sharp turns while running fast 0  19. Hopping  0  20. Rolling over in bed 4  Score total:  39 / 80 = 48.8 %  Functional limitation: Moderate     COGNITION: Overall cognitive status: Within functional limits for tasks assessed    SENSATION: WFL Intermittent numbness in hands since ACDF, R lumbar radiculopathy  POSTURE:  B ankle supination with stance and SLS heel raises  PALPATION: Increased muscle tension in B glutes, piriformis, TFL, hip flexors and ITB (L>R), with TTP in L glutes and piriformis  MUSCLE LENGTH: Hamstrings: mod tight B ITB: mod/severe tight L>R Piriformis: mod tight L>R Hip  flexors: mod/severe tight B Quads: mild tight B Heelcord: mild tight L>R  LOWER EXTREMITY ROM: Grossly WFL other than restrictions due to muscle tightness as above  LOWER EXTREMITY MMT:  MMT Right eval Left eval R 12/24/24 L 12/24/24  Hip flexion 5 5 5 5   Hip extension 4 4- 4+ 4+  Hip abduction 4+ 4 4+ 4  Hip adduction 4+ 4 4+ 4+  Hip internal rotation 4 p! Lateral hip 4 p! Lateral hip 4+ 4+  Hip external rotation 4 4 4 4   Knee flexion 5 5 5 5   Knee extension 5 5 5 5   Ankle dorsiflexion 4+ 4+ 5 5  Ankle plantarflexion 5 5    Ankle inversion      Ankle eversion       (Blank rows = not tested)  LOWER EXTREMITY SPECIAL TESTS:  Hip special tests: Belvie (FABER) test: positive  and  Ober's test: positive     TODAY'S TREATMENT:    12/24/2024  THERAPEUTIC EXERCISE: To improve strength and endurance.  Demonstration, verbal and tactile cues throughout for technique.  NuStep - L8 x 7' (UE/LE)  THERAPEUTIC ACTIVITIES: To improve functional performance.  Demonstration, verbal and tactile cues throughout for technique. LE MMT Goal assessment  NEUROMUSCULAR RE-EDUCATION: To improve strength, coordination, kinesthesia, posture, and proprioception. Side plank + blue TB clam x 10 B Modified side plank + straight leg hip ABD x 10 B Sustained bridge + blue TB hip ABD 2 x 10 B Full plank + hip extension with knee straight x 10 B SAQ + ball squeeze at ankles 2 x 10 - 1st set with unweighted green ball, 2nd set with blue (3000 G) med ball Supine quad set + SLR with hip ER 5# x 10 B B side stepping with looped blue TB at ankles  3 x 10 ft B fwd/back monster walk with looped blue TB at ankles  3 x 10 ft   12/10/2024  THERAPEUTIC EXERCISE: To improve strength, endurance, ROM, and flexibility.  Demonstration, verbal and tactile cues throughout for technique.  NuStep - L8 x 6' (UE/LE) Supine L HS stretch with strap x 30-60  Supine L ITB stretch with strap x 30-60 Supine L glute stretch x  30-60  Hooklying L figure-4 piriformis stretch with overpressure x 30  Hooklying L KTOS piriformis stretch x 30  Hooklying L figure-4 to chest piriformis stretch x 30  Standing L lateral lean ITB stretch x 30  Bridge + GTB hip ABD isometric 10 x 5, 2 sets S/L L GTB clam 10 x 3, 2 sets S/L L hip ABD 2 x 10 Prone L hip extension 2 x 10 1/2 kneel hip flexor stretch x 30-60 B   11/26/2024 - Eval SELF CARE:  Reviewed eval findings and role of PT in addressing identified deficits as well as instruction in initial HEP (see below).    PATIENT EDUCATION:  Education details: HEP update - VMO strengthening and HEP progression - advanced hip strengthening  Person educated: Patient Education method: Explanation, Demonstration, Verbal cues, and MedBridgeGO app updated Education comprehension: verbalized understanding, returned demonstration, verbal cues required, and needs further education  HOME EXERCISE PROGRAM: *Patient using MedBridgeGO app.  Access Code: [email or web address] URL: [email or web address] Date: 12/24/2024 Prepared by: Elijah Hidden  Exercises - Supine Hamstring Stretch with Strap  - 1-2 x daily - 7 x weekly - 3 reps - 30 sec hold - Supine Iliotibial Band Stretch with Strap  - 1-2 x daily - 7 x weekly - 3 reps - 30 sec hold - Supine Gluteus Stretch  - 1-2 x daily - 7 x weekly - 3 reps - 30 sec hold - Supine Figure 4 Piriformis Stretch  - 1-2 x daily - 7 x weekly - 3 reps - 30 sec hold - Supine Piriformis Stretch with Foot on Ground  - 1-2 x daily - 7 x weekly - 3 reps - 30 sec hold - Supine Figure 4 Piriformis Stretch  - 1-2 x daily - 7 x weekly - 3 reps - 30 sec hold - Half Kneeling Hip Flexor Stretch  - 1-2 x daily - 7 x weekly - 3 reps - 30 sec hold - Standing ITB Stretch  - 1-2 x daily - 7 x weekly - 3 reps - 30 sec hold - Straight Leg Raise with External Rotation  - 1 x daily - 7 x weekly - 1-2 sets -  10 reps - 3 sec hold - Bridge with Hip Abduction and  Resistance  - 1 x daily - 7 x weekly - 2 sets - 10 reps - 5 sec hold - Short Arc Quad with [employer]  - 1 x daily - 3 x weekly - 2 sets - 10 reps - 3 sec hold - Side Plank with Clam and Resistance  - 1 x daily - 3 x weekly - 2 sets - 10 reps - 3 sec hold - Modified Side Plank with Hip Abduction  - 1 x daily - 3 x weekly - 2 sets - 10 reps - 3 sec hold - Full Plank with Hip Extension - Knee Straight  - 1 x daily - 3 x weekly - 2 sets - 10 reps - 3 sec hold - Side Stepping with Resistance at Ankles  - 1 x daily - 3 x weekly - 2 sets - 10 reps - Band Walks  - 1 x daily - 3 x weekly - 2 sets - 10 reps   ASSESSMENT:  CLINICAL IMPRESSION: Emit reports 50-60% improvement in his pain thus far with walking tolerance 3x what it had been but still limited by pain.  MMT revealing gains in most muscle groups but still most notable for lateral hip weakness.  Progressed HEP adding increased complexity for better core  and proximal LE muscle recruitment as well as provided black TB for further progression of resistance PRN.  Quad strength testing at 5/5 but delayed VMO activation noted, therefore added exercises to target VMO strengthening.  Clarance is progressing well toward his PT goals and will benefit from continued skilled PT to address ongoing flexibility and strength deficits to improve mobility and activity tolerance with decreased pain interference.    EVAL:  Belford Pascucci is a 64 y.o. male who was referred to physical therapy for evaluation and treatment for chronic B trochanteric hip pain, although patient reports his main concern is that L hip as this seems to limit his activities more so than the R.  Patient reports gradual onset of L>R pain beginning ~2.5 years ago, exacerbated by period of forced inactivity following ACDF last year.  Pain is worse with prolonged walking, running, exercising, and golf.  Patient has deficits in L>R proximal LE flexibility, proximal B LE strength, abnormal posture,  and TTP with abnormal muscle tension  which are interfering with ADLs and recreational activities and are impacting quality of life.  On LEFS patient scored 39/80 demonstrating moderate functional limitation.  Ladarrion will benefit from skilled PT to address above deficits to improve mobility and activity tolerance with decreased pain interference.  OBJECTIVE IMPAIRMENTS: Abnormal gait, decreased activity tolerance, decreased endurance, decreased knowledge of condition, decreased mobility, difficulty walking, decreased strength, increased fascial restrictions, impaired perceived functional ability, increased muscle spasms, impaired flexibility, improper body mechanics, postural dysfunction, and pain.   ACTIVITY LIMITATIONS: standing, squatting, stairs, and locomotion level  PARTICIPATION LIMITATIONS: community activity, occupation, and recreation/golf  PERSONAL FACTORS: Past/current experiences, Time since onset of injury/illness/exacerbation, and 3+ comorbidities: OA - B hips and R knee, R knee meniscal repair 2023, L patellar subluxation, cervical spondylosis with myelopathy and radiculopathy s/p ACDF (C3-4, C4-5, C5-6) 02/28/23, LBP with R lumbar radiculopathy/sciatica, HTN, anxiety, depression, prostate cancer, GERD are also affecting patient's functional outcome.   REHAB POTENTIAL: Good  CLINICAL DECISION MAKING: Evolving/moderate complexity  EVALUATION COMPLEXITY: Moderate   GOALS: Goals reviewed with patient? Yes  SHORT TERM GOALS: Target date: 12/24/2024  Patient will be  independent with initial HEP. Baseline:  Goal status: MET - 12/10/24  2.  Patient will report at least 25% improvement in L hip pain to improve QOL. Baseline: 2/10 on eval, up to 10/10 with walking longer distances Goal status: MET - 12/24/24 - Pt reports 50-60% improvement in pain  LONG TERM GOALS: Target date: 01/21/2025  Patient will be independent with advanced/ongoing HEP to improve outcomes and carryover.   Baseline:  12/10/24 - HEP updated with initial strengthening program Goal status: IN PROGRESS - 12/24/24 - HEP progressed  2.  Patient will report at least 50-75% improvement in L hip pain to improve QOL. Baseline: 2/10 on eval, up to 10/10 with walking longer distances Goal status: IN PROGRESS - 12/24/24 - Pt reports 50-60% improvement in pain  3.  Patient will demonstrate improved B LE strength to >/= 4+/5 for improved stability and ease of mobility. Baseline: Refer to above LE MMT table Goal status: PARTIALLY MET - 12/24/24 - met except B hip ER and L hip ABD 4/5  4.  Patient will be able to ambulate 1+ miles without increased pain to access community.  Baseline: Unable to walk 1/2 mile without severe pain Goal status: INITIAL   5.  Patient will report >/= 48/80 on LEFS (MCID = 9 pts) to demonstrate improved functional ability. Baseline: 39 / 80 = 48.8 % Goal status: INITIAL  6.  Patient will report ability to resume playing golf without limitation due to L hip pain. Baseline: L hip pain prevents him from playing golf Goal status: INITIAL    PLAN:  PT FREQUENCY: 1-2x/week  PT DURATION: 8 weeks  PLANNED INTERVENTIONS: 02835- PT Re-evaluation, 97750- Physical Performance Testing, 97110-Therapeutic exercises, 97530- Therapeutic activity, [id number]- Neuromuscular re-education, 97535- Self Care, 02859- Manual therapy, [phone]- Gait training, [phone]- Aquatic Therapy, [phone]- Electrical stimulation (unattended), [phone]- Ultrasound, [id number]- Ionotophoresis 4mg /ml Dexamethasone , 79439 (1-2 muscles), [id number] (3+ muscles)- Dry Needling, Patient/Family education, Balance training, Stair training, Taping, Joint mobilization, Cryotherapy, and Moist heat  PLAN FOR NEXT SESSION: progress core/lumbopelvic and proximal LE strengthening (try forward +/- lateral lunges, walking lunges or split squats), updating HEP accordingly as pt preferring to come only 1x/wk to qow; MT +/- TPDN to address abnormal muscle  tension in glutes, TFL and lateral hip/thigh musculature   Elijah CHRISTELLA Hidden, PT 12/24/2024, 10:40 AM  "

## 2024-12-25 ENCOUNTER — Telehealth: Payer: Self-pay | Admitting: Family Medicine

## 2024-12-25 NOTE — Telephone Encounter (Signed)
 Copied from CRM [id number]. Topic: General - Other >> Dec 25, 2024  4:13 PM Rea ORN wrote: Reason for CRM: Annel, a care corrdinator with [employer], want to give PCP her information. She is available if she has any questions concerning the pt.   Phone-  [phone]  ext [phone]

## 2024-12-31 ENCOUNTER — Ambulatory Visit: Admitting: Physical Therapy

## 2024-12-31 NOTE — Telephone Encounter (Signed)
Returned call.  Left VM to return call.

## 2025-01-07 ENCOUNTER — Ambulatory Visit: Admitting: Physical Therapy

## 2025-01-14 ENCOUNTER — Ambulatory Visit: Admitting: Physical Therapy

## 2025-01-21 ENCOUNTER — Ambulatory Visit: Admitting: Physical Therapy
